# Patient Record
Sex: Female | Born: 1965
Health system: Southern US, Community
[De-identification: ages and names within clinical notes are randomized; demographics above are authoritative.]

## PROBLEM LIST (undated history)

## (undated) DIAGNOSIS — M949 Disorder of cartilage, unspecified: Secondary | ICD-10-CM

## (undated) DIAGNOSIS — F419 Anxiety disorder, unspecified: Secondary | ICD-10-CM

## (undated) DIAGNOSIS — D649 Anemia, unspecified: Secondary | ICD-10-CM

## (undated) DIAGNOSIS — Z87442 Personal history of urinary calculi: Secondary | ICD-10-CM

## (undated) DIAGNOSIS — E538 Deficiency of other specified B group vitamins: Secondary | ICD-10-CM

## (undated) DIAGNOSIS — F411 Generalized anxiety disorder: Secondary | ICD-10-CM

## (undated) DIAGNOSIS — K589 Irritable bowel syndrome without diarrhea: Secondary | ICD-10-CM

## (undated) DIAGNOSIS — F329 Major depressive disorder, single episode, unspecified: Secondary | ICD-10-CM

## (undated) DIAGNOSIS — D518 Other vitamin B12 deficiency anemias: Secondary | ICD-10-CM

## (undated) DIAGNOSIS — E669 Obesity, unspecified: Secondary | ICD-10-CM

## (undated) DIAGNOSIS — M899 Disorder of bone, unspecified: Secondary | ICD-10-CM

## (undated) DIAGNOSIS — N920 Excessive and frequent menstruation with regular cycle: Secondary | ICD-10-CM

## (undated) DIAGNOSIS — E785 Hyperlipidemia, unspecified: Secondary | ICD-10-CM

## (undated) DIAGNOSIS — K648 Other hemorrhoids: Secondary | ICD-10-CM

## (undated) DIAGNOSIS — F32A Depression, unspecified: Secondary | ICD-10-CM

## (undated) DIAGNOSIS — K625 Hemorrhage of anus and rectum: Secondary | ICD-10-CM

## (undated) DIAGNOSIS — R45851 Suicidal ideations: Secondary | ICD-10-CM

## (undated) DIAGNOSIS — F4310 Post-traumatic stress disorder, unspecified: Secondary | ICD-10-CM

## (undated) DIAGNOSIS — F332 Major depressive disorder, recurrent severe without psychotic features: Secondary | ICD-10-CM

## (undated) HISTORY — DX: Other hemorrhoids: K64.8

## (undated) HISTORY — DX: Major depressive disorder, recurrent severe without psychotic features: F33.2

## (undated) HISTORY — DX: Generalized anxiety disorder: F41.1

## (undated) HISTORY — DX: Depression, unspecified: F32.A

## (undated) HISTORY — DX: Personal history of urinary calculi: Z87.442

## (undated) HISTORY — DX: Disorder of bone, unspecified: M89.9

## (undated) HISTORY — DX: Major depressive disorder, single episode, unspecified: F32.9

## (undated) HISTORY — DX: Obesity, unspecified: E66.9

## (undated) HISTORY — PX: LITHOTRIPSY: SUR834

## (undated) HISTORY — DX: Irritable bowel syndrome without diarrhea: K58.9

## (undated) HISTORY — PX: COLONOSCOPY: SHX174

## (undated) HISTORY — DX: Other vitamin B12 deficiency anemias: D51.8

## (undated) HISTORY — DX: Hyperlipidemia, unspecified: E78.5

## (undated) HISTORY — DX: Hemorrhage of anus and rectum: K62.5

## (undated) HISTORY — DX: Excessive and frequent menstruation with regular cycle: N92.0

## (undated) HISTORY — DX: Anemia, unspecified: D64.9

## (undated) HISTORY — DX: Post-traumatic stress disorder, unspecified: F43.10

## (undated) HISTORY — DX: Disorder of cartilage, unspecified: M94.9

## (undated) HISTORY — DX: Suicidal ideations: R45.851

---

## 1998-11-22 ENCOUNTER — Other Ambulatory Visit: Admission: RE | Admit: 1998-11-22 | Discharge: 1998-11-22 | Payer: Self-pay | Admitting: Obstetrics and Gynecology

## 1999-12-03 ENCOUNTER — Emergency Department (HOSPITAL_COMMUNITY): Admission: EM | Admit: 1999-12-03 | Discharge: 1999-12-03 | Payer: Self-pay | Admitting: Emergency Medicine

## 1999-12-03 ENCOUNTER — Encounter: Payer: Self-pay | Admitting: Emergency Medicine

## 2000-01-02 ENCOUNTER — Other Ambulatory Visit: Admission: RE | Admit: 2000-01-02 | Discharge: 2000-01-02 | Payer: Self-pay | Admitting: Obstetrics and Gynecology

## 2000-10-04 ENCOUNTER — Encounter: Payer: Self-pay | Admitting: Internal Medicine

## 2000-10-04 ENCOUNTER — Encounter: Admission: RE | Admit: 2000-10-04 | Discharge: 2000-10-04 | Payer: Self-pay | Admitting: Internal Medicine

## 2001-03-21 ENCOUNTER — Other Ambulatory Visit: Admission: RE | Admit: 2001-03-21 | Discharge: 2001-03-21 | Payer: Self-pay | Admitting: Obstetrics and Gynecology

## 2001-10-17 ENCOUNTER — Encounter
Admission: RE | Admit: 2001-10-17 | Discharge: 2002-01-15 | Payer: Self-pay | Admitting: Physical Medicine & Rehabilitation

## 2002-03-25 ENCOUNTER — Encounter: Payer: Self-pay | Admitting: Emergency Medicine

## 2002-03-25 ENCOUNTER — Emergency Department (HOSPITAL_COMMUNITY): Admission: EM | Admit: 2002-03-25 | Discharge: 2002-03-25 | Payer: Self-pay | Admitting: Emergency Medicine

## 2002-05-27 ENCOUNTER — Other Ambulatory Visit: Admission: RE | Admit: 2002-05-27 | Discharge: 2002-05-27 | Payer: Self-pay | Admitting: Obstetrics and Gynecology

## 2002-09-17 ENCOUNTER — Inpatient Hospital Stay (HOSPITAL_COMMUNITY): Admission: EM | Admit: 2002-09-17 | Discharge: 2002-09-22 | Payer: Self-pay | Admitting: Psychiatry

## 2002-10-07 ENCOUNTER — Other Ambulatory Visit (HOSPITAL_COMMUNITY): Admission: RE | Admit: 2002-10-07 | Discharge: 2002-10-26 | Payer: Self-pay | Admitting: Psychiatry

## 2003-06-14 ENCOUNTER — Other Ambulatory Visit: Admission: RE | Admit: 2003-06-14 | Discharge: 2003-06-14 | Payer: Self-pay | Admitting: Obstetrics and Gynecology

## 2004-12-26 ENCOUNTER — Ambulatory Visit: Payer: Self-pay | Admitting: Family Medicine

## 2005-01-01 ENCOUNTER — Ambulatory Visit: Payer: Self-pay | Admitting: Family Medicine

## 2005-02-05 ENCOUNTER — Other Ambulatory Visit: Admission: RE | Admit: 2005-02-05 | Discharge: 2005-02-05 | Payer: Self-pay | Admitting: Obstetrics and Gynecology

## 2005-04-10 ENCOUNTER — Ambulatory Visit: Payer: Self-pay | Admitting: Internal Medicine

## 2005-05-14 ENCOUNTER — Ambulatory Visit: Payer: Self-pay | Admitting: Internal Medicine

## 2005-10-05 ENCOUNTER — Ambulatory Visit: Payer: Self-pay | Admitting: Internal Medicine

## 2006-01-12 ENCOUNTER — Emergency Department (HOSPITAL_COMMUNITY): Admission: EM | Admit: 2006-01-12 | Discharge: 2006-01-12 | Payer: Self-pay | Admitting: Emergency Medicine

## 2006-01-17 ENCOUNTER — Ambulatory Visit: Payer: Self-pay | Admitting: Internal Medicine

## 2006-01-18 ENCOUNTER — Ambulatory Visit (HOSPITAL_COMMUNITY): Admission: RE | Admit: 2006-01-18 | Discharge: 2006-01-18 | Payer: Self-pay | Admitting: Urology

## 2006-01-24 ENCOUNTER — Ambulatory Visit (HOSPITAL_COMMUNITY): Admission: RE | Admit: 2006-01-24 | Discharge: 2006-01-24 | Payer: Self-pay | Admitting: Urology

## 2006-05-06 ENCOUNTER — Ambulatory Visit: Payer: Self-pay | Admitting: Internal Medicine

## 2006-06-03 ENCOUNTER — Ambulatory Visit (HOSPITAL_BASED_OUTPATIENT_CLINIC_OR_DEPARTMENT_OTHER): Admission: RE | Admit: 2006-06-03 | Discharge: 2006-06-03 | Payer: Self-pay | Admitting: Urology

## 2006-08-08 ENCOUNTER — Ambulatory Visit: Payer: Self-pay | Admitting: Internal Medicine

## 2006-08-08 LAB — CONVERTED CEMR LAB
Folate: 5.4 ng/mL
TSH: 1.49 microintl units/mL (ref 0.35–5.50)
Vitamin B-12: 248 pg/mL (ref 211–911)

## 2006-08-09 ENCOUNTER — Encounter: Admission: RE | Admit: 2006-08-09 | Discharge: 2006-08-09 | Payer: Self-pay | Admitting: Internal Medicine

## 2006-08-14 ENCOUNTER — Ambulatory Visit: Payer: Self-pay | Admitting: Internal Medicine

## 2006-09-09 ENCOUNTER — Ambulatory Visit: Payer: Self-pay | Admitting: Internal Medicine

## 2006-10-04 ENCOUNTER — Ambulatory Visit: Payer: Self-pay | Admitting: Internal Medicine

## 2006-10-28 ENCOUNTER — Ambulatory Visit (HOSPITAL_COMMUNITY): Admission: RE | Admit: 2006-10-28 | Discharge: 2006-10-28 | Payer: Self-pay | Admitting: Urology

## 2006-10-30 ENCOUNTER — Ambulatory Visit: Payer: Self-pay | Admitting: Internal Medicine

## 2006-11-20 ENCOUNTER — Ambulatory Visit: Payer: Self-pay | Admitting: Internal Medicine

## 2006-12-06 ENCOUNTER — Ambulatory Visit: Payer: Self-pay | Admitting: Internal Medicine

## 2006-12-06 LAB — CONVERTED CEMR LAB
Folate: >20 ng/mL
Vitamin B-12: 1029 pg/mL — ABNORMAL HIGH (ref 211–911)

## 2007-01-02 ENCOUNTER — Ambulatory Visit: Payer: Self-pay | Admitting: Internal Medicine

## 2007-01-02 LAB — CONVERTED CEMR LAB
AST: 20 units/L (ref 0–37)
Albumin: 3.6 g/dL (ref 3.5–5.2)
Alkaline Phosphatase: 71 units/L (ref 39–117)
Cholesterol: 293 mg/dL (ref 0–200)
Total CHOL/HDL Ratio: 6.9
VLDL: 50 mg/dL — ABNORMAL HIGH (ref 0–40)

## 2007-01-29 ENCOUNTER — Inpatient Hospital Stay (HOSPITAL_COMMUNITY): Admission: RE | Admit: 2007-01-29 | Discharge: 2007-01-31 | Payer: Self-pay | Admitting: Psychiatry

## 2007-01-29 ENCOUNTER — Emergency Department (HOSPITAL_COMMUNITY): Admission: EM | Admit: 2007-01-29 | Discharge: 2007-01-29 | Payer: Self-pay | Admitting: Emergency Medicine

## 2007-01-29 ENCOUNTER — Ambulatory Visit: Payer: Self-pay | Admitting: Psychiatry

## 2007-02-02 ENCOUNTER — Emergency Department (HOSPITAL_COMMUNITY): Admission: EM | Admit: 2007-02-02 | Discharge: 2007-02-02 | Payer: Self-pay | Admitting: Emergency Medicine

## 2007-02-06 ENCOUNTER — Encounter: Admission: RE | Admit: 2007-02-06 | Discharge: 2007-02-06 | Payer: Self-pay | Admitting: Neurology

## 2007-02-18 ENCOUNTER — Encounter: Payer: Self-pay | Admitting: Internal Medicine

## 2007-03-03 ENCOUNTER — Ambulatory Visit: Payer: Self-pay | Admitting: Internal Medicine

## 2007-04-08 DIAGNOSIS — M899 Disorder of bone, unspecified: Secondary | ICD-10-CM | POA: Insufficient documentation

## 2007-04-08 DIAGNOSIS — M949 Disorder of cartilage, unspecified: Secondary | ICD-10-CM

## 2007-04-08 DIAGNOSIS — F329 Major depressive disorder, single episode, unspecified: Secondary | ICD-10-CM

## 2007-04-08 DIAGNOSIS — Z87442 Personal history of urinary calculi: Secondary | ICD-10-CM

## 2007-04-08 DIAGNOSIS — F3289 Other specified depressive episodes: Secondary | ICD-10-CM | POA: Insufficient documentation

## 2007-04-08 HISTORY — DX: Personal history of urinary calculi: Z87.442

## 2007-04-08 HISTORY — DX: Disorder of bone, unspecified: M89.9

## 2007-04-08 HISTORY — DX: Other specified depressive episodes: F32.89

## 2007-04-08 HISTORY — DX: Major depressive disorder, single episode, unspecified: F32.9

## 2007-04-22 ENCOUNTER — Encounter: Payer: Self-pay | Admitting: Internal Medicine

## 2007-05-01 ENCOUNTER — Ambulatory Visit: Payer: Self-pay | Admitting: Internal Medicine

## 2007-05-01 DIAGNOSIS — D518 Other vitamin B12 deficiency anemias: Secondary | ICD-10-CM

## 2007-05-01 HISTORY — DX: Other vitamin B12 deficiency anemias: D51.8

## 2007-05-08 ENCOUNTER — Ambulatory Visit: Payer: Self-pay | Admitting: Internal Medicine

## 2007-06-05 ENCOUNTER — Ambulatory Visit: Payer: Self-pay | Admitting: Internal Medicine

## 2007-06-09 ENCOUNTER — Ambulatory Visit: Payer: Self-pay | Admitting: Internal Medicine

## 2007-06-09 ENCOUNTER — Encounter: Payer: Self-pay | Admitting: Internal Medicine

## 2007-07-04 ENCOUNTER — Ambulatory Visit: Payer: Self-pay | Admitting: Internal Medicine

## 2007-07-04 LAB — CONVERTED CEMR LAB
Basophils Absolute: 0.1 10*3/uL (ref 0.0–0.1)
Eosinophils Absolute: 0.1 10*3/uL (ref 0.0–0.6)
Eosinophils Relative: 1.6 % (ref 0.0–5.0)
MCV: 90.8 fL (ref 78.0–100.0)
Monocytes Absolute: 0.6 10*3/uL (ref 0.2–0.7)
Vit D, 1,25-Dihydroxy: 26 — ABNORMAL LOW (ref 30–89)
Vitamin B-12: >1500 pg/mL — ABNORMAL HIGH (ref 211–911)
WBC: 9.1 10*3/uL (ref 4.5–10.5)

## 2007-07-08 ENCOUNTER — Telehealth: Payer: Self-pay | Admitting: *Deleted

## 2007-07-10 ENCOUNTER — Ambulatory Visit: Payer: Self-pay | Admitting: Internal Medicine

## 2007-07-15 ENCOUNTER — Ambulatory Visit: Payer: Self-pay | Admitting: Internal Medicine

## 2007-08-07 ENCOUNTER — Ambulatory Visit: Payer: Self-pay | Admitting: Internal Medicine

## 2007-08-07 LAB — CONVERTED CEMR LAB: Vit D, 1,25-Dihydroxy: 26 — ABNORMAL LOW (ref 30–89)

## 2007-09-03 ENCOUNTER — Ambulatory Visit: Payer: Self-pay | Admitting: Internal Medicine

## 2007-09-30 ENCOUNTER — Ambulatory Visit: Payer: Self-pay | Admitting: Internal Medicine

## 2007-10-06 DIAGNOSIS — K644 Residual hemorrhoidal skin tags: Secondary | ICD-10-CM | POA: Insufficient documentation

## 2007-10-06 DIAGNOSIS — K589 Irritable bowel syndrome without diarrhea: Secondary | ICD-10-CM | POA: Insufficient documentation

## 2007-10-06 DIAGNOSIS — E785 Hyperlipidemia, unspecified: Secondary | ICD-10-CM

## 2007-10-06 DIAGNOSIS — K625 Hemorrhage of anus and rectum: Secondary | ICD-10-CM

## 2007-10-06 DIAGNOSIS — K648 Other hemorrhoids: Secondary | ICD-10-CM

## 2007-10-06 DIAGNOSIS — N920 Excessive and frequent menstruation with regular cycle: Secondary | ICD-10-CM

## 2007-10-06 DIAGNOSIS — E669 Obesity, unspecified: Secondary | ICD-10-CM

## 2007-10-06 HISTORY — DX: Hemorrhage of anus and rectum: K62.5

## 2007-10-06 HISTORY — DX: Obesity, unspecified: E66.9

## 2007-10-06 HISTORY — DX: Excessive and frequent menstruation with regular cycle: N92.0

## 2007-10-06 HISTORY — DX: Irritable bowel syndrome, unspecified: K58.9

## 2007-10-06 HISTORY — DX: Other hemorrhoids: K64.8

## 2007-10-06 HISTORY — DX: Hyperlipidemia, unspecified: E78.5

## 2007-10-21 ENCOUNTER — Ambulatory Visit: Payer: Self-pay | Admitting: Internal Medicine

## 2007-11-18 ENCOUNTER — Ambulatory Visit: Payer: Self-pay | Admitting: Internal Medicine

## 2007-12-23 ENCOUNTER — Ambulatory Visit: Payer: Self-pay | Admitting: Internal Medicine

## 2008-01-22 ENCOUNTER — Ambulatory Visit: Payer: Self-pay | Admitting: Internal Medicine

## 2008-03-02 ENCOUNTER — Telehealth: Payer: Self-pay | Admitting: Internal Medicine

## 2008-07-15 ENCOUNTER — Telehealth: Payer: Self-pay | Admitting: Internal Medicine

## 2008-10-12 IMAGING — CR DG ABDOMEN 1V
1 series · 1 of 1 positions shown · non-contrast
Comparison: none

CLINICAL DATA: Preoperative exam.  Right renal stone.
 ABDOMEN ? 1 VIEW:

[t abdomen supine]
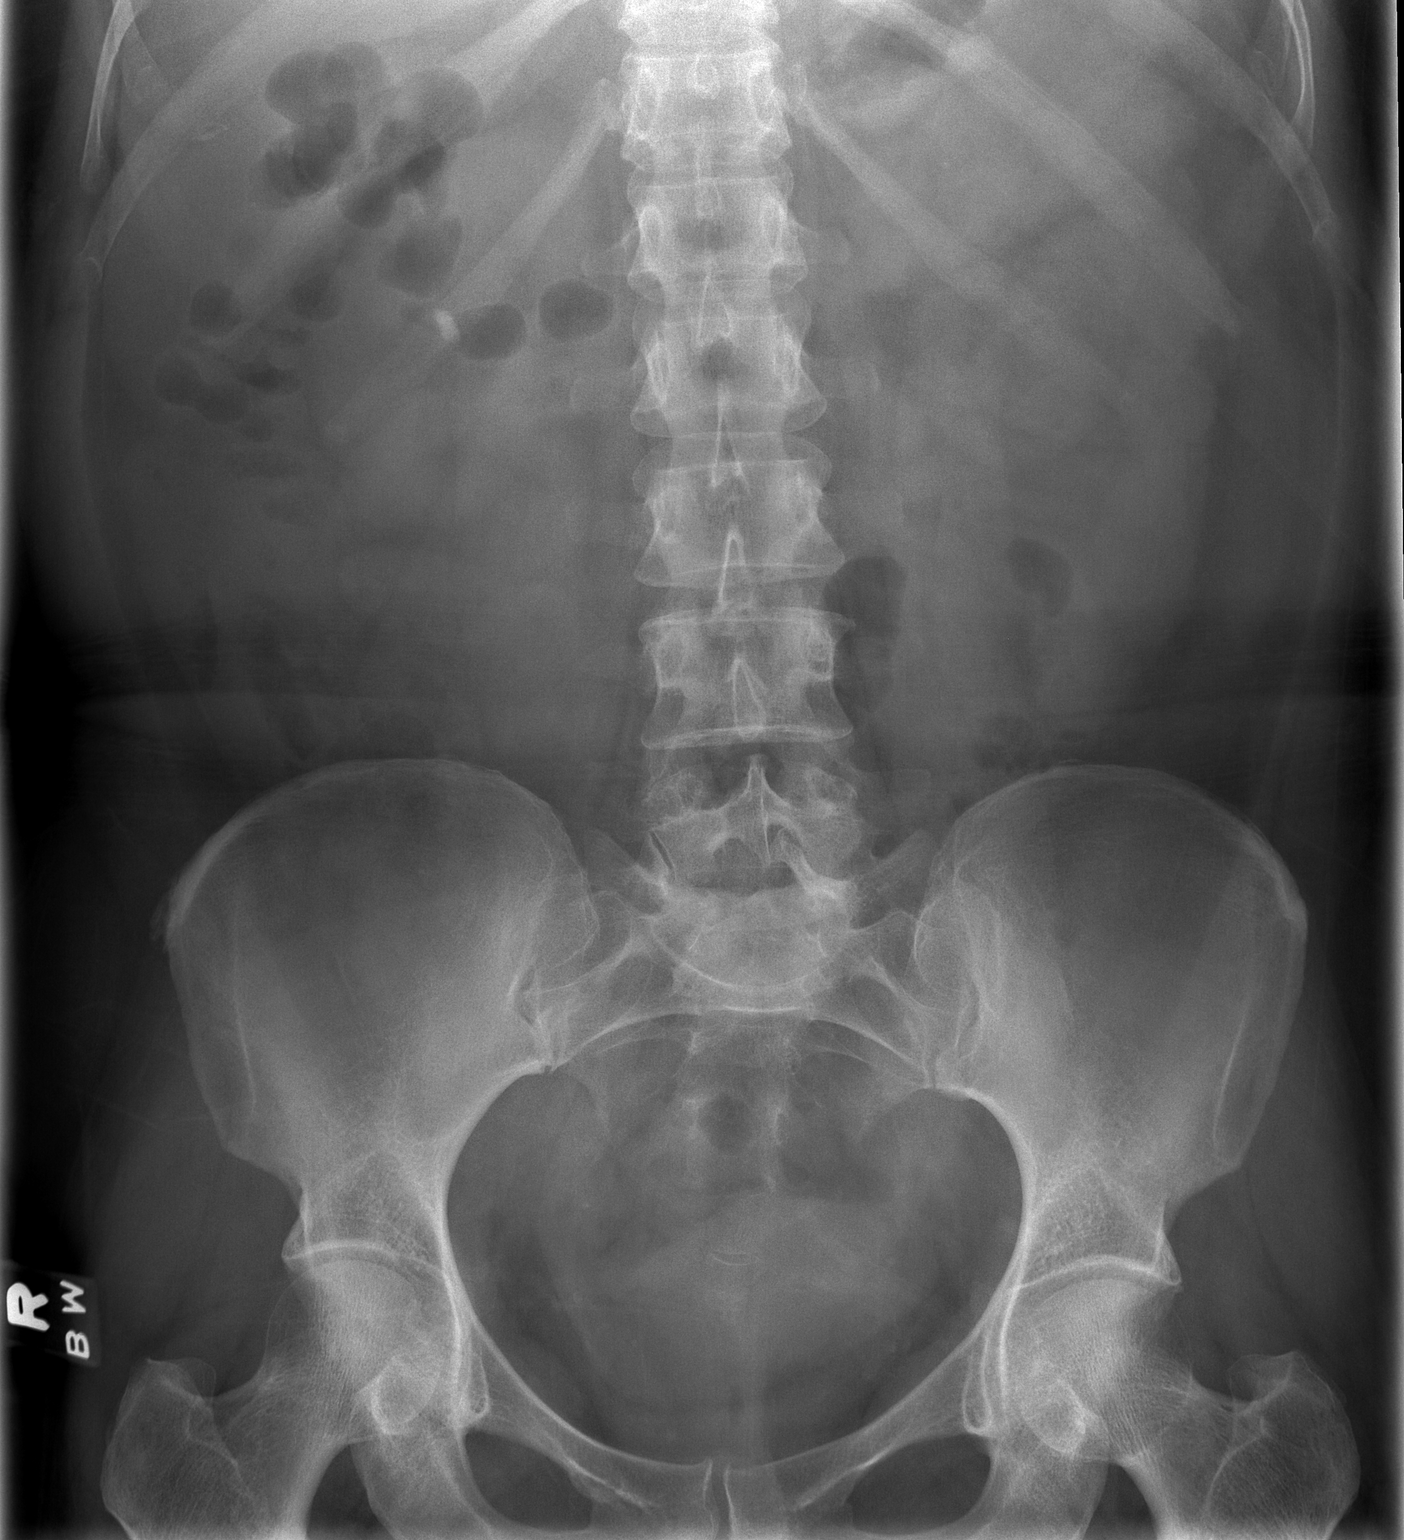

[1 of 1 positions shown; findings below may reference images not displayed]

FINDINGS: There is a 10 x 5 mm stone in the mid portion of the right kidney.  There is a tiny calcification in the upper pole of the left kidney.  No discrete calculi are seen along the course of either ureter.  Bowel gas pattern is normal.  No significant bony abnormality.
IMPRESSION: Right renal stone as described.

## 2010-01-24 ENCOUNTER — Emergency Department (HOSPITAL_COMMUNITY): Admission: EM | Admit: 2010-01-24 | Discharge: 2010-01-24 | Payer: Self-pay | Admitting: Emergency Medicine

## 2010-02-23 ENCOUNTER — Encounter: Payer: Self-pay | Admitting: Internal Medicine

## 2010-09-26 NOTE — Letter (Signed)
Summary: Diagnostic Exam/[facility]  [facility]   Imported By: Maryln Gottron 03/16/2010 14:47:41  _____________________________________________________________________  External Attachment:    Type:   Image     Comment:   External Document

## 2010-11-13 LAB — COMPREHENSIVE METABOLIC PANEL
Albumin: 4.1 g/dL (ref 3.5–5.2)
Chloride: 107 mEq/L (ref 96–112)
GFR calc non Af Amer: >60 mL/min (ref >60)
Glucose, Bld: 101 mg/dL — ABNORMAL HIGH (ref 70–99)
Potassium: 3.9 mEq/L (ref 3.5–5.1)
Total Protein: 7.9 g/dL (ref 6.0–8.3)

## 2010-11-13 LAB — RAPID URINE DRUG SCREEN, HOSP PERFORMED
Amphetamines: NOT DETECTED
Barbiturates: NOT DETECTED
Benzodiazepines: NOT DETECTED
Opiates: NOT DETECTED
Tetrahydrocannabinol: NOT DETECTED

## 2010-11-13 LAB — DIFFERENTIAL
Basophils Relative: 1 % (ref 0–1)
Eosinophils Relative: 2 % (ref 0–5)
Neutrophils Relative %: 60 % (ref 43–77)

## 2010-11-13 LAB — CBC
MCHC: 33.8 g/dL (ref 30.0–36.0)
MCV: 90.7 fL (ref 78.0–100.0)
RBC: 4.16 MIL/uL (ref 3.87–5.11)
RDW: 13.8 % (ref 11.5–15.5)
WBC: 10.8 10*3/uL — ABNORMAL HIGH (ref 4.0–10.5)

## 2010-11-13 LAB — URINALYSIS, ROUTINE W REFLEX MICROSCOPIC
Bilirubin Urine: NEGATIVE
Protein, ur: NEGATIVE mg/dL
Specific Gravity, Urine: 1.007 (ref 1.005–1.030)
pH: 6 (ref 5.0–8.0)

## 2010-11-15 ENCOUNTER — Other Ambulatory Visit: Payer: Self-pay | Admitting: Obstetrics and Gynecology

## 2010-11-15 DIAGNOSIS — R928 Other abnormal and inconclusive findings on diagnostic imaging of breast: Secondary | ICD-10-CM

## 2010-12-01 ENCOUNTER — Ambulatory Visit
Admission: RE | Admit: 2010-12-01 | Discharge: 2010-12-01 | Disposition: A | Discharge status: Home / Self Care | Payer: BC Managed Care – PPO | Source: Ambulatory Visit | Attending: Obstetrics and Gynecology | Admitting: Obstetrics and Gynecology

## 2010-12-01 DIAGNOSIS — R928 Other abnormal and inconclusive findings on diagnostic imaging of breast: Secondary | ICD-10-CM

## 2011-01-09 NOTE — Assessment & Plan Note (Signed)
[facility]                         GASTROENTEROLOGY OFFICE NOTE   NAME:Toni Alvarado, Toni Alvarado                       MRN:          [id number]  DATE:07/15/2007                            DOB:          May 28, 1966    CHIEF COMPLAINT:  Followup after colonoscopy.   HISTORY OF PRESENT ILLNESS:  She is having occasional bright red blood  on the tissue paper from her hemorrhoids (found at colonoscopy) and has  been having some diarrhea.  Overall, her bowel habits are pretty good,  without too much constipation or diarrhea.  Occasional heartburn,  usually after spicy foods.  Wondering why she is calcium, vitamin D and  vitamin B12 deficient.   PAST MEDICAL HISTORY:  Is listed and reviewed and is unchanged from  previous.  Her colonoscopy had random biopsies that were unrevealing,  i.e.  no colitis.   MEDICATIONS:  Listed and reviewed in the chart.   SOCIAL HISTORY:  She is not smoking.   ALLERGIES:  Reviewed and updated.   PHYSICAL EXAMINATION:  VITAL SIGNS:  Weight 187 pounds.  Pulse 78.  Blood pressure 100/64, height 5 feet 3-3/4 inches.   ASSESSMENT:  1. Irritable bowel syndrome, overall improved.  2. Rectal bleeding from hemorrhoids.  3. She is obese.   PLAN:  1. Reassured.  2. IBS and GERD handouts given, particularly paying attention to GERD      diet.  She can use an over the counter antacid or H2 blocker if      needed intermittently.  3. Align probiotic, samples are given to the patient.  4. Given her body habitus I think it would be pretty unlikely she      would have Celiac disease.  After she left, and as I dictate,      Celiac disease could explain vitamin deficiencies and osteopenia,      though there are also many people that have it without that.  I      think her low iron was from previous menorrhagia.  Should she      continue to have problems, we could screen with Celiac antibodies      but I would hold off on that right now.  She  will see me as needed      otherwise.   Note:  We plan for a colonoscopy again at about age 46 and after that,  due to some family history issues, probably would go every 5 years,  unless we know something different in the interim.     Iva Boop, MD,FACG  Electronically Signed    CEG/MedQ  DD: 07/15/2007  DT: 07/16/2007  Job #: [id number]   cc:   Stacie Glaze, MD  Guy Sandifer Henderson Cloud, M.D.

## 2011-01-09 NOTE — Assessment & Plan Note (Signed)
[facility]                         GASTROENTEROLOGY OFFICE NOTE   NAME:Toni Alvarado, Toni Alvarado                       MRN:          [id number]  DATE:05/08/2007                            DOB:          08-05-1966    CHIEF COMPLAINT:  Rectal bleeding and diarrhea.   I had seen Toni Alvarado last year for urgent defecation, diarrhea problems,  some incontinence.  She has had rectal bleeding.  These symptoms have  persisted.  She was scheduled for colonoscopy at that time, but because  of kidney Schueler issues deferred.  She has seen Dr. Henderson Cloud recently and  was having these problems again more persistently and he recommended she  come back to see me.   The symptoms are really not significantly changed from my note of  May 06, 2006.   MEDICATIONS:  Listed in the chart.  She is on B12 monthly, Prozac, Omega-  3, potassium citrate, multivitamin.   Laboratory testing last year demonstrated normal LFTs, TSH, CBC.   PAST MEDICAL HISTORY:  1. Dyslipidemia.  2. Intermittent depression.  3. Nephrolithiasis requiring lithotripsy, cystoscopy and stenting.  4. Osteopenia.  5. Pelvic relaxation.  6. Menorrhagia problems.   REVIEW OF SYSTEMS:  There is some confusion as to whether she is  actually having rectal bleeding or menstrual bleeding at times, although  clearly in the past she has had rectal bleeding.   FAMILY HISTORY:  Mother may have irritable bowel syndrome.  Her mother  has had colon polyps and her mother's sister was recently diagnosed with  colon cancer in her early 69s.   PHYSICAL EXAMINATION:  GENERAL:  In no acute distress.  VITAL SIGNS:  Weight 189 pounds, pulse 80, blood pressure 118/68.  ABDOMEN:  Obese, soft, nontender.   ASSESSMENT:  1. Recurrent rectal bleeding and diarrhea, persistent problems.  2. Family history of colon cancer in one second-degree relative and      family history of colon polyps in one first-degree relative.  3.  Obesity/overweight.   PLAN:  1. Schedule colonoscopy.  Risks and benefits had been previously      reviewed.  2. Further therapy plans pending that.  I would not be surprised if      this lady has irritable bowel syndrome.  3. Regarding her obesity, we have discussed diet.  She is walking and      drinking water and reducing types of foods, but I explained to her      that she would probably take a 1200-1500 calorie diet on a steady      basis to really drop weight.  Her goal weight would be 144 for top      normal, though I think 150-160 would be good for her.  I also      provided information about the dangers of high fructose corn syrup      and suggested she look into the [facility] Diet or Sugar Busters      Diet.     Iva Boop, MD,FACG  Electronically Signed    CEG/MedQ  DD: 05/08/2007  DT: 05/09/2007  Job #: [id number]   cc:   Guy Sandifer. Henderson Cloud, M.D.  Stacie Glaze, MD
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
## 2011-01-12 NOTE — Op Note (Signed)
NAMEBERNETTE, SEEMAN                ACCOUNT NO.:  [id number]   MEDICAL RECORD NO.:  [id number]          PATIENT TYPE:  AMB   LOCATION:  DAY                          FACILITY:  [facility]   PHYSICIAN:  Valetta Fuller, M.D.  DATE OF BIRTH:  Feb 23, 1966   DATE OF PROCEDURE:  01/18/2006  DATE OF DISCHARGE:                                 OPERATIVE REPORT   PREOPERATIVE DIAGNOSIS:  1.  Right ureteropelvic junction Quattrone.  2.  Hydronephrosis.   POSTOPERATIVE DIAGNOSIS:  1.  Right ureteropelvic junction Haddon.  2.  Hydronephrosis.   PROCEDURE PERFORMED:  Cystoscopy, right retrograde pyelography, right double-  J stent placement.   SURGEON:  Valetta Fuller, M.D.   ANESTHESIA:  General.   INDICATIONS:  Ms. Toni Alvarado is a 45 year old female.  She presented with right  flank pain recently.  She had 2-3 episodes of nephrolithiasis in the past.  She had a CT scan done which showed a 7 x 10 mm Gavin in her right renal  pelvis at the ureteropelvic junction area/proximal ureter.  She also had  several additional stones in the kidney, several of which were medium in  size.  We felt that given the holiday weekend coming up with hydronephrosis  and ongoing pain as well as some question of mild infection, it was prudent  to go ahead and decompress the kidney.  We have gotten her on the schedule  for elective lithotripsy.  The patient has been on antibiotics, has been  afebrile, and is just having mild discomfort with some generalized lethargy  at this point.  She presents now for stent placement.   TECHNIQUE AND FINDINGS:  The patient was brought to the operating room where  she had successful induction of general anesthesia.  She was placed in the  lithotomy position, prepped and draped in the usual manner.  She received IV  ciprofloxacin.  Cystoscopy revealed an unremarkable bladder.  Attention was  turned towards retrograde pyelography.  This was done with an 8-French cone-  tip catheter.  The  distal ureter was normal.  An 8-10 mm filling defect was  noted in the very proximal ureter causing high grade obstruction with a  moderately dilated caliceal pelvic system.  This was done with fluoroscopic  guidance and interpreted by myself.   Attention was then turned towards stent placement.  Initial attempts to put  the sensor wire past the Headrick were unsuccessful.  By utilizing an open-  ended catheter to provide some additional stiffness, we were able to get the  wire  passed without difficulty.  Once that was accomplished, a 5-French 24 cm  Polaris stent was placed without the dangle string.  The position was  confirmed with fluoroscopy as well as direct vision.  The patient appeared  to tolerate the procedure well and there were no obvious complications or  difficulty.           ______________________________  Valetta Fuller, M.D.  Electronically Signed     DSG/MEDQ  D:  01/18/2006  T:  01/19/2006  Job:  [id number]

## 2011-01-12 NOTE — Op Note (Signed)
NAMEMICHEALE, SCHLACK                ACCOUNT NO.:  [id number]   MEDICAL RECORD NO.:  [id number]          PATIENT TYPE:  AMB   LOCATION:  NESC                         FACILITY:  [facility]   PHYSICIAN:  Valetta Fuller, M.D.  DATE OF BIRTH:  10-Jan-1966   DATE OF PROCEDURE:  06/03/2006  DATE OF DISCHARGE:                                 OPERATIVE REPORT   PREOPERATIVE DIAGNOSIS:  1. Multiple right distal ureteral calculi.  2. Right renal calculus.   POSTOPERATIVE DIAGNOSIS:  1. Multiple right distal ureteral calculi.  2. Right renal calculus.   PROCEDURE PERFORMED:  Cystoscopy, right retrograde pyelography, rigid  ureteroscopy, Holmium laser lithotripsy, basketing of fragments, flexible  ureteroscopy, double-J stent placement.   SURGEON:  Valetta Fuller, M.D.   ANESTHESIA:  General.   INDICATIONS:  Ms. Meenan is a 45 year old female who has significant history  of nephrolithiasis.  She was originally seen and treated in May of this year  at which point she had developed right flank pain and was found to have  about a 10 mm Welchel in her renal pelvis and another 10 mm Goldwater in her lower  to mid pole of that kidney.  She ended up having a double-J stent and then  had a lithotripsy.  She passed a few fragments and then became asymptomatic.  It looked like things had broke up nicely and the stent was removed.  The  patient subsequently had some intermittent nausea and mild discomfort.  We  followed up with an additional Orcutt protocol CT and we found that she had  multiple stones in her ureter.  We gave her some additional weeks to try to  pass these spontaneously but that has not occurred.  She is felt to have, to  some degree, a lodging of Cappiello fragments in her distal ureter.  She also  requested that we make an attempt to try to treat this mid pole Schuessler if at  all possible.  She is here now for assessment and treatment of the above  mentioned issues.   TECHNIQUE AND FINDINGS:  The  patient was brought to the operating room where  she had successful induction of general anesthesia.  She was placed in the  lithotomy position.  The initial evaluation consisted of right retrograde  pyelography.  Cystoscopic assessment of the bladder was unremarkable.  Using  an 8-French cone tip catheter and real time fluoroscopic assessment, we did  a retrograde of the right ureter which showed multiple distal right ureteral  filling defects with mild obstruction.   At the completion of retrograde, a guidewire was placed up the renal pelvis  without difficulty.  The distal right ureter was easily engaged with a short  rigid ureteroscope.  Multiple Younes fragments were encountered.  The largest  piece appeared to be approximately 4 x 8 mm.  Holmium laser lithotripsy was  used to break all these fragments into smaller pieces.  Several 2-3 mm  fragments were then basket extracted and we ended up removing about a half a  dozen pieces from her ureter.  No additional  distal ureteral fragments were  noted.  Since that portion of the procedure went well, we felt it was  reasonable to go and assess the kidney Grau in her right kidney to see if  that would be amendable to lithotripsy.  Over the guidewire, an access  sheath was placed and a flexible ureteroscope was inserted.  We were able to  visualize a Santoro in a lower calix.  It did take quite an acute angle.  We  attempted, for approximately 30-40 minutes, to try to be able to access that  Pore but because of the angulation, I was really never able to really  carefully visualize the Guilbault at the same time as placing the laser fiber on  the Reta.  The combination of the acute angle and respiratory motion made  it very difficult to effectively treat the Barrack.  We were able to treat a  few small fragments and I felt, at that point, that breaking the Levett into  2-3 larger pieces would probably be actually a disservice rather than a  benefit  to the patient, and we elected to consider that Reitman for possible  lithotripsy down the road or continued observation.  Otherwise, no  complications or problems occurred.  Over the guidewire, we placed a 24 cm 6-  French double-J stent which was confirmed to be a good position.  The  patient appeared to tolerate things well, there were no obvious  complications or problems.           ______________________________  Valetta Fuller, M.D.  Electronically Signed     DSG/MEDQ  D:  06/03/2006  T:  06/04/2006  Job:  [id number]
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
## 2011-01-12 NOTE — Assessment & Plan Note (Signed)
HEALTHCARE                           GASTROENTEROLOGY OFFICE NOTE   NAME:Toni Alvarado, Toni Alvarado                       MRN:          [id number]  DATE:05/06/2006                            DOB:          November 24, 1965    REASON FOR CONSULTATION:  Diarrhea, rectal bleeding.   ASSESSMENT:  A 45 year old white woman with some chronic urgent defecation  with fecal incontinence, diarrhea-type problems.  Mainly a daytime problem  but can be nocturnal.  It is associated with some rectal bleeding.  The main  differential diagnosis is inflammatory bowel disease versus irritable bowel  syndrome, though colorectal neoplasia is possible.   She also has some acid reflux symptoms that are greatly improved by dietary  changes she has made.   RECOMMENDATIONS AND PLAN:  1. Schedule colonoscopy to investigate the rectal bleeding and chronic      diarrhea.  Strongly consider random biopsies of the colon and terminal      ileum intubation.  2. Further plans pending that.  I have explained the risks, benefits and      indications of the procedure to the patient and she understands and      agrees to proceed.  3. We have requested labs from Dr. Huel Coventry office and on March 04, 2006,      she had a normal CBC, LFTs and TSH, free T4 and free T3.   HISTORY:  This lady has had several years of problems with crampy lower  quadrant pain associated with urgent defecation that can lead to fecal  incontinence.  Mainly a postprandial thing.  Spicy and heavy, greasy foods  are the main problems.  She has not noted an association with milk and, in  fact, does not drink much.  There is moderate caffeine ingestion with 2 cups  of coffee and some tea each day.  After multiple bowel movements she will  note a swelling and will see some bright red blood on the tissue paper.  She  has had some nocturnal symptoms.  There is nausea, gas and bloating  associated with this.   MEDICATIONS:  1.  Prozac 40 mg daily.  2. Iron daily.  3. Vitamin C daily.  4. Omega-3 fatty acid daily.   No known drug allergies.   PAST MEDICAL HISTORY:  1. Dyslipidemia.  2. Depression intermittently.  3. Nephrolithiasis with recent lithotripsy after cystoscopy and stent      placement.  4. Osteopenia.   FAMILY HISTORY:  Mother may have irritable bowel syndrome.  There is  alcoholism in her father, mother and brother and grandfathers.  Diabetes is  in the family.  There is no colon cancer.   SOCIAL HISTORY:  She is going back to school and studying elementary  education at [employer].  She has one son and one daughter and lives  with her husband.  She does smoke 5 cigarettes a day and is trying to quit  and is encouraged.  There is rare alcohol.  There is no drug use.   REVIEW OF SYSTEMS:  See medical history and physical form for these  details.   PHYSICAL EXAMINATION:  GENERAL:  A pleasant overweight to obese white woman.  VITAL SIGNS:  Height 5 feet 3-3/4 inches, weight 190 pounds, blood pressure  118/66, pulse 62.  HEENT:  Eyes:  Anicteric.  ENT:  Normal mouth and posterior pharynx.  NECK:  Supple with no thyromegaly or mass.  CHEST:  Clear, resonant.  CARDIAC:  S1, S2, no rubs or gallops.  ABDOMEN:  Soft and diffusely tender without organomegaly or mass and no  guarding.  There is no hernia detected.  RECTAL:  Deferred until colonoscopy.  LYMPHATIC:  No neck or supraclavicular nodes.  EXTREMITIES:  No peripheral edema of the lower extremities.  SKIN:  No rash, warm and dry.  NEUROLOGIC/PSYCHIATRIC:  She is alert and oriented x3.   I appreciate the opportunity to care for this patient.  I have reviewed the  office notes from Dr. Henderson Cloud as well.                                   Iva Boop, MD,FACG   CEG/MedQ  DD:  05/06/2006  DT:  05/07/2006  Job #:  [id number]   cc:   Guy Sandifer. Henderson Cloud, M.D.  Valetta Fuller, M.D.  Stacie Glaze, MD

## 2011-06-14 LAB — DIFFERENTIAL
Basophils Absolute: 0
Basophils Relative: 0
Basophils Relative: 1
Eosinophils Absolute: 0.1
Eosinophils Relative: 1
Lymphocytes Relative: 33
Lymphs Abs: 3.2
Monocytes Absolute: 0.7
Monocytes Relative: 7
Neutro Abs: 5.8
Neutrophils Relative %: 58
Neutrophils Relative %: 61

## 2011-06-14 LAB — URINALYSIS, ROUTINE W REFLEX MICROSCOPIC
Bilirubin Urine: NEGATIVE
Glucose, UA: NEGATIVE
Hgb urine dipstick: NEGATIVE
Ketones, ur: NEGATIVE
Nitrite: NEGATIVE
Nitrite: NEGATIVE
Specific Gravity, Urine: 1.011
pH: 7

## 2011-06-14 LAB — CBC
MCHC: 33.8
MCHC: 34.2
Platelets: 319
RDW: 13.4
WBC: 9.7

## 2011-06-14 LAB — RAPID URINE DRUG SCREEN, HOSP PERFORMED
Barbiturates: NOT DETECTED
Benzodiazepines: NOT DETECTED
Cocaine: NOT DETECTED

## 2011-06-14 LAB — BASIC METABOLIC PANEL
BUN: 9
CO2: 26
CO2: 31
Calcium: 10
Chloride: 104
Creatinine, Ser: 0.71
GFR calc Af Amer: >60
Glucose, Bld: 89
Sodium: 135

## 2011-06-14 LAB — ETHANOL: Alcohol, Ethyl (B): <5

## 2011-06-14 LAB — POCT PREGNANCY, URINE: Operator id: 173591

## 2011-06-14 LAB — CARBAMAZEPINE LEVEL, TOTAL: Carbamazepine Lvl: 10.7

## 2012-03-25 ENCOUNTER — Other Ambulatory Visit: Payer: Self-pay | Admitting: Obstetrics and Gynecology

## 2012-03-25 DIAGNOSIS — R928 Other abnormal and inconclusive findings on diagnostic imaging of breast: Secondary | ICD-10-CM

## 2012-03-31 ENCOUNTER — Ambulatory Visit
Admission: RE | Admit: 2012-03-31 | Discharge: 2012-03-31 | Disposition: A | Discharge status: Home / Self Care | Payer: BC Managed Care – PPO | Source: Ambulatory Visit | Attending: Obstetrics and Gynecology | Admitting: Obstetrics and Gynecology

## 2012-03-31 DIAGNOSIS — R928 Other abnormal and inconclusive findings on diagnostic imaging of breast: Secondary | ICD-10-CM

## 2013-11-12 ENCOUNTER — Other Ambulatory Visit: Payer: Self-pay

## 2013-11-12 DIAGNOSIS — Z1231 Encounter for screening mammogram for malignant neoplasm of breast: Secondary | ICD-10-CM

## 2013-11-12 DIAGNOSIS — Z803 Family history of malignant neoplasm of breast: Secondary | ICD-10-CM

## 2013-11-24 ENCOUNTER — Other Ambulatory Visit: Payer: Self-pay | Admitting: Obstetrics and Gynecology

## 2013-11-25 ENCOUNTER — Ambulatory Visit
Admission: RE | Admit: 2013-11-25 | Discharge: 2013-11-25 | Disposition: A | Discharge status: Home / Self Care | Payer: BC Managed Care – PPO | Source: Ambulatory Visit

## 2013-11-25 DIAGNOSIS — Z803 Family history of malignant neoplasm of breast: Secondary | ICD-10-CM

## 2013-11-25 DIAGNOSIS — Z1231 Encounter for screening mammogram for malignant neoplasm of breast: Secondary | ICD-10-CM

## 2014-03-13 ENCOUNTER — Ambulatory Visit (INDEPENDENT_AMBULATORY_CARE_PROVIDER_SITE_OTHER): Payer: BC Managed Care – PPO | Admitting: Family Medicine

## 2014-03-13 VITALS — BP 106/70 | HR 70 | Temp 98.8°F | Resp 16 | Ht 63.5 in | Wt 184.4 lb

## 2014-03-13 DIAGNOSIS — Z0000 Encounter for general adult medical examination without abnormal findings: Secondary | ICD-10-CM

## 2014-03-13 DIAGNOSIS — Z23 Encounter for immunization: Secondary | ICD-10-CM

## 2014-03-13 NOTE — Progress Notes (Signed)
Subjective:  This chart was scribed for Toni Haber, MD by Donato Schultz, Medical Scribe. This patient was seen in Room 11 and the patient's care was started at 12:33 PM.   Patient ID: Toni Alvarado, female    DOB: [birth year], 48 y.o.   MRN: [id number]  HPI HPI Comments: Toni Alvarado is a 48 y.o. female with a history of anemia and depression who presents to the [facility] for an annual exam.  She denies having a history of heart disease, breathing problems, vision problems, and hearing loss.  She states that her last TDAP was in 2001.  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  The patient states that her PCP is Dr. Gertie Fey.  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     Past Medical History  Diagnosis Date  . Anemia   . Depression    Past Surgical History  Procedure Laterality Date  . Lithotripsy     Family History  Problem Relation Age of Onset  . Hyperlipidemia Mother   . Mental illness Mother   . Diabetes Father   . Heart disease Father   . Hyperlipidemia Father   . Stroke Father   . Mental illness Father   . Mental illness Brother   . Cancer Brother   . Breast cancer Sister    History   Social History  . Marital Status: Legally Separated    Spouse Name: N/A    Number of Children: N/A  . Years of Education: N/A   Occupational History  . Not on file.   Social History Main Topics  . Smoking status: Never Smoker   . Smokeless tobacco: Never Used  . Alcohol Use: No  . Drug Use: No  . Sexual Activity: Not on file   Other Topics Concern  . Not on file   Social History Narrative  . No narrative on file   No Known Allergies  Review of Systems  All other systems reviewed and are negative.    Objective:  Physical Exam  Nursing note and vitals reviewed. Constitutional: She is oriented to person, place, and time. She appears well-developed and well-nourished.  HENT:  Head:  Normocephalic and atraumatic.  Right Ear: External ear normal.  Left Ear: External ear normal.  Mouth/Throat: Oropharynx is clear and moist. No oropharyngeal exudate.  Eyes: Conjunctivae and EOM are normal. Pupils are equal, round, and reactive to light.  Neck: Normal range of motion. Neck supple. No JVD present. No thyromegaly present.  Cardiovascular: Normal rate, regular rhythm, normal heart sounds and intact distal pulses.   No murmur heard. Pulmonary/Chest: Effort normal and breath sounds normal. No stridor. No respiratory distress. She has no decreased breath sounds. She has no wheezes. She has no rhonchi. She has no rales.  Abdominal: Soft. She exhibits no distension and no mass. There is no tenderness. There is no rebound and no guarding.  Musculoskeletal: Normal range of motion. She exhibits no edema and no tenderness.  Lymphadenopathy:    She has no cervical adenopathy.  Neurological: She is alert and oriented to person, place, and time. She displays normal reflexes. No cranial nerve deficit. She exhibits normal muscle tone. Coordination normal.  Skin: Skin is warm and dry. No rash noted. No erythema.  Psychiatric: She has a normal mood and affect. Her behavior is normal.     BP 106/70  Pulse 70  Temp(Src) 98.8 F (  37.1 C) (Oral)  Resp 16  Ht 5' 3.5" (1.613 m)  Wt 184 lb 6 oz (83.632 kg)  BMI 32.14 kg/m2  SpO2 98%  LMP 02/25/2014  Assessment & Plan:    I personally performed the services described in this documentation, which was scribed in my presence. The recorded information has been reviewed and is accurate.  Need for prophylactic vaccination with combined diphtheria-tetanus-pertussis (DTP) vaccine - Plan: Tdap vaccine greater than or equal to 7yo IM  Routine general medical examination at a health care facility - Plan: Tdap vaccine greater than or equal to 7yo IM  Signed, Toni Haber, MD

## 2014-05-11 ENCOUNTER — Encounter (HOSPITAL_COMMUNITY): Payer: Self-pay | Admitting: Emergency Medicine

## 2014-05-11 ENCOUNTER — Emergency Department (HOSPITAL_COMMUNITY)
Admission: EM | Admit: 2014-05-11 | Discharge: 2014-05-12 | Disposition: A | Discharge status: Other Acute Inpatient Hospital | Payer: BC Managed Care – PPO | Attending: Emergency Medicine | Admitting: Emergency Medicine

## 2014-05-11 DIAGNOSIS — F411 Generalized anxiety disorder: Secondary | ICD-10-CM | POA: Insufficient documentation

## 2014-05-11 DIAGNOSIS — Z862 Personal history of diseases of the blood and blood-forming organs and certain disorders involving the immune mechanism: Secondary | ICD-10-CM | POA: Insufficient documentation

## 2014-05-11 DIAGNOSIS — F3289 Other specified depressive episodes: Secondary | ICD-10-CM | POA: Insufficient documentation

## 2014-05-11 DIAGNOSIS — Z8639 Personal history of other endocrine, nutritional and metabolic disease: Secondary | ICD-10-CM | POA: Diagnosis not present

## 2014-05-11 DIAGNOSIS — R5383 Other fatigue: Secondary | ICD-10-CM

## 2014-05-11 DIAGNOSIS — R45851 Suicidal ideations: Secondary | ICD-10-CM | POA: Insufficient documentation

## 2014-05-11 DIAGNOSIS — IMO0002 Reserved for concepts with insufficient information to code with codable children: Secondary | ICD-10-CM | POA: Insufficient documentation

## 2014-05-11 DIAGNOSIS — F329 Major depressive disorder, single episode, unspecified: Secondary | ICD-10-CM | POA: Insufficient documentation

## 2014-05-11 DIAGNOSIS — F332 Major depressive disorder, recurrent severe without psychotic features: Secondary | ICD-10-CM

## 2014-05-11 DIAGNOSIS — Z7901 Long term (current) use of anticoagulants: Secondary | ICD-10-CM | POA: Diagnosis not present

## 2014-05-11 DIAGNOSIS — R5381 Other malaise: Secondary | ICD-10-CM | POA: Diagnosis not present

## 2014-05-11 DIAGNOSIS — F419 Anxiety disorder, unspecified: Secondary | ICD-10-CM

## 2014-05-11 DIAGNOSIS — Z79899 Other long term (current) drug therapy: Secondary | ICD-10-CM | POA: Diagnosis not present

## 2014-05-11 HISTORY — DX: Anxiety disorder, unspecified: F41.9

## 2014-05-11 HISTORY — DX: Deficiency of other specified B group vitamins: E53.8

## 2014-05-11 LAB — CBC
HEMATOCRIT: 36.3 % (ref 36.0–46.0)
Hemoglobin: 12.2 g/dL (ref 12.0–15.0)
MCH: 30 pg (ref 26.0–34.0)
MCHC: 33.6 g/dL (ref 30.0–36.0)
MCV: 89.2 fL (ref 78.0–100.0)
PLATELETS: 299 10*3/uL (ref 150–400)
RBC: 4.07 MIL/uL (ref 3.87–5.11)
RDW: 13 % (ref 11.5–15.5)
WBC: 8.7 10*3/uL (ref 4.0–10.5)

## 2014-05-11 LAB — COMPREHENSIVE METABOLIC PANEL
ALT: 12 U/L (ref 0–35)
ANION GAP: 15 (ref 5–15)
AST: 15 U/L (ref 0–37)
Albumin: 3.8 g/dL (ref 3.5–5.2)
Alkaline Phosphatase: 83 U/L (ref 39–117)
BUN: 11 mg/dL (ref 6–23)
CALCIUM: 9.5 mg/dL (ref 8.4–10.5)
CO2: 22 meq/L (ref 19–32)
CREATININE: 0.62 mg/dL (ref 0.50–1.10)
Chloride: 101 mEq/L (ref 96–112)
GLUCOSE: 91 mg/dL (ref 70–99)
Potassium: 4.1 mEq/L (ref 3.7–5.3)
Sodium: 138 mEq/L (ref 137–147)
Total Bilirubin: <0.2 mg/dL — ABNORMAL LOW (ref 0.3–1.2)
Total Protein: 7.3 g/dL (ref 6.0–8.3)

## 2014-05-11 LAB — ETHANOL

## 2014-05-11 LAB — RAPID URINE DRUG SCREEN, HOSP PERFORMED
Amphetamines: NOT DETECTED
Barbiturates: NOT DETECTED
Benzodiazepines: NOT DETECTED
Cocaine: NOT DETECTED
Opiates: NOT DETECTED
Tetrahydrocannabinol: NOT DETECTED

## 2014-05-11 LAB — ACETAMINOPHEN LEVEL: Acetaminophen (Tylenol), Serum: <15 ug/mL (ref 10–30)

## 2014-05-11 LAB — SALICYLATE LEVEL

## 2014-05-11 MED ORDER — NICOTINE 21 MG/24HR TD PT24: 21.0000 mg | MEDICATED_PATCH | Freq: Every day | TRANSDERMAL | Status: DC

## 2014-05-11 MED ORDER — ALUM & MAG HYDROXIDE-SIMETH 200-200-20 MG/5ML PO SUSP: 30.0000 mL | ORAL | Status: DC | PRN

## 2014-05-11 MED ORDER — ACETAMINOPHEN 325 MG PO TABS: 650.0000 mg | ORAL_TABLET | ORAL | Status: DC | PRN

## 2014-05-11 MED ORDER — IBUPROFEN 200 MG PO TABS: 600.0000 mg | ORAL_TABLET | Freq: Three times a day (TID) | ORAL | Status: DC | PRN

## 2014-05-11 MED ORDER — VORTIOXETINE HBR 20 MG PO TABS
20.0000 mg | ORAL_TABLET | Freq: Every day | ORAL | Status: DC
  Administered 2014-05-12: 20 mg via ORAL
  Filled 2014-05-11: qty 20

## 2014-05-11 MED ORDER — ONDANSETRON HCL 4 MG PO TABS: 4.0000 mg | ORAL_TABLET | Freq: Three times a day (TID) | ORAL | Status: DC | PRN

## 2014-05-11 MED ORDER — ZOLPIDEM TARTRATE 5 MG PO TABS: 5.0000 mg | ORAL_TABLET | Freq: Every evening | ORAL | Status: DC | PRN

## 2014-05-11 MED ORDER — VORTIOXETINE HBR 10 MG PO TABS
1.0000 | ORAL_TABLET | Freq: Every day | ORAL | Status: DC
  Administered 2014-05-12: 30 mg via ORAL
  Filled 2014-05-11: qty 1

## 2014-05-11 MED ORDER — PERPHENAZINE 4 MG PO TABS
4.0000 mg | ORAL_TABLET | Freq: Every evening | ORAL | Status: DC
  Filled 2014-05-11: qty 1

## 2014-05-11 MED ORDER — LORAZEPAM 1 MG PO TABS
1.0000 mg | ORAL_TABLET | Freq: Three times a day (TID) | ORAL | Status: DC | PRN
  Administered 2014-05-11 – 2014-05-12 (×2): 1 mg via ORAL
  Filled 2014-05-11 (×2): qty 1

## 2014-05-11 NOTE — ED Notes (Signed)
Patient was wanded by security.  Patient has no belongings family took them with them.

## 2014-05-11 NOTE — ED Notes (Signed)
Pt's family states pt was recently IVC'd at [facility] in [place] and was put on prolactin, ativan and brinalex but family states she is not getting any better, has been sleeping a lot and today stated that she was going to lock herself in her room and take all of her medication.

## 2014-05-11 NOTE — ED Provider Notes (Signed)
CSN: [id number]     Arrival date & time 05/11/14  2112 History   First MD Initiated Contact with Patient 05/11/14 2309     This chart was scribed for non-physician practitioner, Antonietta Breach, PA-C working with Wandra Arthurs, MD by Forrestine Him, ED Scribe. This patient was seen in room WBH40/WBH40 and the patient's care was started at 6:34 AM.   Chief Complaint  Patient presents with  . Depression   The history is provided by the patient and a relative. No language interpreter was used.    HPI Comments: Toni Alvarado is a 48 y.o. female who presents to the Emergency Department complaining of anxiety and depression that has worsened in the last 6 days. She attributes worsening symptoms to a number of new events occuring in her life. Pt reports a recent anxiety attack that lasted for about 3 hours last week. At that time pt was seen at [facility] in [place]. IVC papers were taken out on her secondary to mentioning possibly SI. Pt was placed on Prolactin, Ativan, and Brinalex during visit. However, pt denies much improvement from symptoms. Toni Alvarado was seen by her Psychiatrist for follow up yesterday and was started on Perphenazine and Ativan. Since starting new medications, pt reports ongoing, constant fatigue. This afternoon prior to arrival pt stated she would "lock herself in her room and take all of her medication". No other concerns this visit.   Past Medical History  Diagnosis Date  . Anemia   . Depression   . B12 deficiency   . Anxiety    Past Surgical History  Procedure Laterality Date  . Lithotripsy     Family History  Problem Relation Age of Onset  . Hyperlipidemia Mother   . Mental illness Mother   . Diabetes Father   . Heart disease Father   . Hyperlipidemia Father   . Stroke Father   . Mental illness Father   . Mental illness Brother   . Cancer Brother   . Breast cancer Sister    History  Substance Use Topics  . Smoking status: Never Smoker   . Smokeless  tobacco: Never Used  . Alcohol Use: No   OB History   Grav Para Term Preterm Abortions TAB SAB Ect Mult Living                  Review of Systems  Constitutional: Positive for fatigue.  Psychiatric/Behavioral: Positive for suicidal ideas. The patient is nervous/anxious.   All other systems reviewed and are negative.   Allergies  Review of patient's allergies indicates no known allergies.  Home Medications   Prior to Admission medications   Medication Sig Start Date End Date Taking? Authorizing Provider  LORazepam (ATIVAN) 1 MG tablet Take 1 mg by mouth every 6 (six) hours as needed for anxiety.   Yes Historical Provider, MD  perphenazine (TRILAFON) 8 MG tablet Take 4 mg by mouth every evening.   Yes Historical Provider, MD  Vortioxetine HBr (BRINTELLIX) 10 MG TABS Take 1 tablet by mouth daily. Take along with the 20 mg to equal 30 mg daily.   Yes Historical Provider, MD  Vortioxetine HBr (BRINTELLIX) 20 MG TABS Take 20 mg by mouth daily. Takes with the 10 mg to equal 30 mg daily.   Yes Historical Provider, MD   Triage Vitals: BP 105/59  Pulse 77  Temp(Src) 97 F (36.1 C) (Oral)  Resp 18  SpO2 100%  LMP 05/09/2014   Physical Exam  Nursing  note and vitals reviewed. Constitutional: She is oriented to person, place, and time. She appears well-developed and well-nourished. No distress.  Nontoxic/nonseptic appearing  HENT:  Head: Normocephalic and atraumatic.  Eyes: Conjunctivae and EOM are normal. No scleral icterus.  Neck: Normal range of motion.  Cardiovascular: Normal rate, regular rhythm and normal heart sounds.   Pulmonary/Chest: Effort normal. No respiratory distress.  Abdominal: She exhibits no distension.  Musculoskeletal: Normal range of motion.  Neurological: She is alert and oriented to person, place, and time. She exhibits normal muscle tone. Coordination normal.  GCS 15. Speech is goal oriented. Patient moves extremities without ataxia.  Skin: Skin is warm and  dry. No rash noted. She is not diaphoretic. No erythema. No pallor.  Psychiatric: Her mood appears anxious. Her speech is rapid and/or pressured. She is agitated. She expresses no homicidal and no suicidal ideation. She expresses no suicidal plans and no homicidal plans.    ED Course  Procedures (including critical care time)  DIAGNOSTIC STUDIES: Oxygen Saturation is 99% on RA, Normal by my interpretation.    COORDINATION OF CARE: 6:34 AM-Discussed treatment plan with pt at bedside and pt agreed to plan.     Labs Review Labs Reviewed  COMPREHENSIVE METABOLIC PANEL - Abnormal; Notable for the following:    Total Bilirubin <0.2 (*)    All other components within normal limits  SALICYLATE LEVEL - Abnormal; Notable for the following:    Salicylate Lvl <4.0 (*)    All other components within normal limits  ACETAMINOPHEN LEVEL  CBC  ETHANOL  URINE RAPID DRUG SCREEN (HOSP PERFORMED)    Imaging Review No results found.   EKG Interpretation None      MDM   Final diagnoses:  Anxiety    48 year old female presents to the emergency department for worsening anxiety. He should recently with hospitalization at [facility] in [place]. Patient states the regimen which she was started on during this admission has not been helping her anxiety. Patient denies SI/HI. Labs reviewed he had patient medically cleared.  Patient evaluated by TTS. Patient meets criteria for inpatient treatment program. Patient currently pending bed availability; will likely transfer once bed becomes available.  I personally performed the services described in this documentation, which was scribed in my presence. The recorded information has been reviewed and is accurate.     Antonietta Breach, [place] 05/12/14 [phone]

## 2014-05-12 ENCOUNTER — Encounter (HOSPITAL_COMMUNITY): Payer: Self-pay | Admitting: *Deleted

## 2014-05-12 ENCOUNTER — Inpatient Hospital Stay (HOSPITAL_COMMUNITY)
Admission: EM | Admit: 2014-05-12 | Discharge: 2014-05-17 | DRG: 885 | Disposition: A | Discharge status: Home / Self Care | Payer: BC Managed Care – PPO | Source: Intra-hospital | Attending: Psychiatry | Admitting: Psychiatry

## 2014-05-12 ENCOUNTER — Encounter (HOSPITAL_COMMUNITY): Payer: Self-pay | Admitting: Emergency Medicine

## 2014-05-12 DIAGNOSIS — IMO0002 Reserved for concepts with insufficient information to code with codable children: Secondary | ICD-10-CM | POA: Diagnosis not present

## 2014-05-12 DIAGNOSIS — F411 Generalized anxiety disorder: Secondary | ICD-10-CM

## 2014-05-12 DIAGNOSIS — Z8249 Family history of ischemic heart disease and other diseases of the circulatory system: Secondary | ICD-10-CM | POA: Diagnosis not present

## 2014-05-12 DIAGNOSIS — F410 Panic disorder [episodic paroxysmal anxiety] without agoraphobia: Secondary | ICD-10-CM | POA: Diagnosis present

## 2014-05-12 DIAGNOSIS — Z833 Family history of diabetes mellitus: Secondary | ICD-10-CM | POA: Diagnosis not present

## 2014-05-12 DIAGNOSIS — Z823 Family history of stroke: Secondary | ICD-10-CM | POA: Diagnosis not present

## 2014-05-12 DIAGNOSIS — Z803 Family history of malignant neoplasm of breast: Secondary | ICD-10-CM

## 2014-05-12 DIAGNOSIS — R45851 Suicidal ideations: Secondary | ICD-10-CM

## 2014-05-12 DIAGNOSIS — F332 Major depressive disorder, recurrent severe without psychotic features: Principal | ICD-10-CM

## 2014-05-12 DIAGNOSIS — G4700 Insomnia, unspecified: Secondary | ICD-10-CM | POA: Diagnosis present

## 2014-05-12 HISTORY — DX: Suicidal ideations: R45.851

## 2014-05-12 MED ORDER — NICOTINE 21 MG/24HR TD PT24
21.0000 mg | MEDICATED_PATCH | Freq: Every day | TRANSDERMAL | Status: DC
  Filled 2014-05-12 (×2): qty 1

## 2014-05-12 MED ORDER — HYDROXYZINE HCL 25 MG PO TABS
25.0000 mg | ORAL_TABLET | Freq: Four times a day (QID) | ORAL | Status: DC | PRN
  Administered 2014-05-13 – 2014-05-15 (×4): 25 mg via ORAL
  Filled 2014-05-12 (×3): qty 1
  Filled 2014-05-12: qty 10
  Filled 2014-05-12: qty 1

## 2014-05-12 MED ORDER — ACETAMINOPHEN 325 MG PO TABS
650.0000 mg | ORAL_TABLET | ORAL | Status: DC | PRN
Start: 2014-05-12 — End: 2014-05-12

## 2014-05-12 MED ORDER — PERPHENAZINE 4 MG PO TABS
4.0000 mg | ORAL_TABLET | Freq: Every evening | ORAL | Status: DC
  Administered 2014-05-12: 4 mg via ORAL
  Filled 2014-05-12 (×4): qty 1

## 2014-05-12 MED ORDER — ONDANSETRON HCL 4 MG PO TABS: 4.0000 mg | ORAL_TABLET | Freq: Three times a day (TID) | ORAL | Status: DC | PRN

## 2014-05-12 MED ORDER — VORTIOXETINE HBR 10 MG PO TABS: 1.0000 | ORAL_TABLET | Freq: Every day | ORAL | Status: DC

## 2014-05-12 MED ORDER — VORTIOXETINE HBR 20 MG PO TABS
20.0000 mg | ORAL_TABLET | Freq: Every day | ORAL | Status: DC
  Filled 2014-05-12: qty 20

## 2014-05-12 MED ORDER — MAGNESIUM HYDROXIDE 400 MG/5ML PO SUSP: 30.0000 mL | Freq: Every day | ORAL | Status: DC | PRN

## 2014-05-12 MED ORDER — ZOLPIDEM TARTRATE 5 MG PO TABS
5.0000 mg | ORAL_TABLET | Freq: Every evening | ORAL | Status: DC | PRN
  Administered 2014-05-12: 5 mg via ORAL
  Filled 2014-05-12: qty 1

## 2014-05-12 MED ORDER — VORTIOXETINE HBR 10 MG PO TABS
30.0000 mg | ORAL_TABLET | Freq: Every day | ORAL | Status: DC
  Administered 2014-05-13 – 2014-05-17 (×5): 30 mg via ORAL
  Filled 2014-05-12 (×4): qty 3
  Filled 2014-05-12: qty 21
  Filled 2014-05-12 (×2): qty 3

## 2014-05-12 MED ORDER — ACETAMINOPHEN 325 MG PO TABS: 650.0000 mg | ORAL_TABLET | Freq: Four times a day (QID) | ORAL | Status: DC | PRN

## 2014-05-12 MED ORDER — LORAZEPAM 1 MG PO TABS: 1.0000 mg | ORAL_TABLET | Freq: Three times a day (TID) | ORAL | Status: DC | PRN

## 2014-05-12 MED ORDER — ALUM & MAG HYDROXIDE-SIMETH 200-200-20 MG/5ML PO SUSP: 30.0000 mL | ORAL | Status: DC | PRN

## 2014-05-12 NOTE — Progress Notes (Signed)
Pt is on the Adult unit.  She reported that her depression and anxiety has been increasing over the last week with panic attacks related to her "stress" she is starting a new teaching job.  Pt has been thinking suicidal thoughts off and on.  She has no plan here in the hospital and does contract to come to staff.  She has had at least one other suicidal in 1991 by over dose. She hates to e alone she is very anxious most of the day. Pt feels her medications are not working for her. She has recently started a medication and she is unsure what the name so she plans to talk with the doctor tomorrow  She denied any S.H ideation or A/V/H.  She was oriented to the milieu and pointed out that she has the schedule in her blue folder along with it on the hallway across from the dayroom.
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
## 2014-05-12 NOTE — Tx Team (Signed)
Initial Interdisciplinary Treatment Plan   PATIENT STRESSORS: Financial difficulties Marital or family conflict Medication change or noncompliance   PROBLEM LIST: Problem List/Patient Goals Date to be addressed Date deferred Reason deferred Estimated date of resolution  "manage my anxiety" 05/12/2014     "get skills here to help me" 05/12/2014                                                DISCHARGE CRITERIA:  Ability to meet basic life and health needs Improved stabilization in mood, thinking, and/or behavior Motivation to continue treatment in a less acute level of care Need for constant or close observation no longer present Verbal commitment to aftercare and medication compliance  PRELIMINARY DISCHARGE PLAN: Attend aftercare/continuing care group Outpatient therapy Return to previous living arrangement  PATIENT/FAMIILY INVOLVEMENT: This treatment plan has been presented to and reviewed with the patient  The patient has been given the opportunity to ask questions and make suggestions.  Karel Jarvis 05/12/2014, 3:56 PM

## 2014-05-12 NOTE — Consult Note (Signed)
  Toni Alvarado says she is no longer suicidal and does not want to go to inpatient.  She has anxiety and inpatient makes it worse.  She is still quite depressed and I think she would be a risk to be discharged home.  She has been accepted to [facility] for treatment of depression and suicidal ideation.

## 2014-05-12 NOTE — BHH Group Notes (Signed)
[place] LCSW Group Therapy  Emotional Regulation 1:15 - 2:30 PM   05/12/2014 2:59 PM  Type of Therapy:  Group Therapy  Participation Level:  Patient did not attend group. She advised of just admitting and not feeling well.    Concha Pyo 05/12/2014, 2:59 PM

## 2014-05-12 NOTE — ED Provider Notes (Signed)
Medical screening examination/treatment/procedure(s) were performed by non-physician practitioner and as supervising physician I was immediately available for consultation/collaboration.   EKG Interpretation None        Wandra Arthurs, MD 05/12/14 0730

## 2014-05-12 NOTE — BH Assessment (Signed)
The patient reports the she may want to go to IOP instead of [facility].  The Psychiatrist will assess and disposition.

## 2014-05-13 ENCOUNTER — Encounter (HOSPITAL_COMMUNITY): Payer: Self-pay | Admitting: Psychiatry

## 2014-05-13 DIAGNOSIS — F332 Major depressive disorder, recurrent severe without psychotic features: Secondary | ICD-10-CM

## 2014-05-13 DIAGNOSIS — F411 Generalized anxiety disorder: Secondary | ICD-10-CM

## 2014-05-13 HISTORY — DX: Major depressive disorder, recurrent severe without psychotic features: F33.2

## 2014-05-13 HISTORY — DX: Generalized anxiety disorder: F41.1

## 2014-05-13 LAB — TSH: TSH: 2.18 u[IU]/mL (ref 0.350–4.500)

## 2014-05-13 MED ORDER — ZOLPIDEM TARTRATE 10 MG PO TABS
10.0000 mg | ORAL_TABLET | Freq: Every evening | ORAL | Status: DC | PRN
  Administered 2014-05-13 – 2014-05-14 (×2): 10 mg via ORAL
  Filled 2014-05-13 (×2): qty 1

## 2014-05-13 MED ORDER — LORAZEPAM 1 MG PO TABS
1.0000 mg | ORAL_TABLET | Freq: Four times a day (QID) | ORAL | Status: DC | PRN
  Administered 2014-05-14 – 2014-05-17 (×3): 1 mg via ORAL
  Filled 2014-05-13 (×3): qty 1
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
## 2014-05-13 NOTE — BHH Group Notes (Signed)
[place] Group Notes:  (Nursing/MHT/Case Management/Adjunct)  Date:  05/13/2014  Time:    Type of Therapy:  Nurse Education  Participation Level:  Active  Participation Quality:  Appropriate  Affect:  Appropriate  Cognitive:  Appropriate  Insight:  Appropriate  Engagement in Group:  Engaged  Modes of Intervention:  Clarification and Support  Summary of Progress/Problems: Morning wellness  Franciso Bend 05/13/2014, 2:55 PM

## 2014-05-13 NOTE — Progress Notes (Signed)
D. Pt has been up and has been visible in milieu this evening, has attended and participated in various milieu activities. Pt appears depressed and flat on the unit and has endorsed feelings of depression and hopelessness and feeling overwhelmed with things going on in her life. A. Support and encouragement provided. R. Safety maintained, will continue to monitor.

## 2014-05-13 NOTE — BHH Suicide Risk Assessment (Signed)
[place] INPATIENT:  Family/Significant Other Suicide Prevention Education  Suicide Prevention Education:  Education Completed; [name], Sister, [phone]; has been identified by the patient as the family member/significant other with whom the patient will be residing, and identified as the person(s) who will aid the patient in the event of a mental health crisis (suicidal ideations/suicide attempt).  With written consent from the patient, the family member/significant other has been provided the following suicide prevention education, prior to the and/or following the discharge of the patient.  The suicide prevention education provided includes the following:  Suicide risk factors  Suicide prevention and interventions  National Suicide Hotline telephone number  [facility] assessment telephone number  [facility] Emergency Assistance [place] and/or Residential Mobile Crisis Unit telephone number  Request made of family/significant other to:  Remove weapons (e.g., guns, rifles, knives), all items previously/currently identified as safety concern.   Sister advised patient does not have access to weapons.     Remove drugs/medications (over-the-counter, prescriptions, illicit drugs), all items previously/currently identified as a safety concern.  The family member/significant other verbalizes understanding of the suicide prevention education information provided.  The family member/significant other agrees to remove the items of safety concern listed above.  Toni Alvarado 05/13/2014, 12:35 PM

## 2014-05-13 NOTE — BHH Group Notes (Signed)
[place] LCSW Group Therapy 05/13/2014  1:15 pm   Type of Therapy: Group Therapy Participation Level: Active  Participation Quality: Attentive, Sharing and Supportive  Affect: Depressed and Flat  Cognitive: Alert and Oriented  Insight: Developing/Improving and Engaged  Engagement in Therapy: Developing/Improving and Engaged  Modes of Intervention: Clarification, Confrontation, Discussion, Education, Exploration, Limit-setting, Orientation, Problem-solving, Rapport Building, Art therapist, Socialization and Support  Summary of Progress/Problems: The topic for group was balance in life. Today's group focused on defining balance in one's own words, identifying things that can knock one off balance, and exploring healthy ways to maintain balance in life. Group members were asked to provide an example of a time when they felt off balance, describe how they handled that situation,and process healthier ways to regain balance in the future. Group members were asked to share the most important tool for maintaining balance that they learned while at [facility] and how they plan to apply this method after discharge. 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 Patient discussed feeling overwhelmed by work and long hours. CSW's and other group members provided emotional support and encouragement.  Tilden Fossa, MSW, [place] Worker [facility] [phone]  \

## 2014-05-13 NOTE — Progress Notes (Signed)
D: Patient has depressed affect and anxious mood. She reported on the self inventory sheet that sleep is fair, poor appetite and ability to concentrate and low energy level. Patient rates depression "8", feelings of hopelessness "7" and anxiety "6-8". Writer observed that the patient has been extremely anxious and tearful throughout the day. Patient is compliant with medication regimen.  A: Support and encouragement provided to patient. PRN Vistaril given for anxiety. Scheduled medications administered per MD orders. Maintain Q15 minute checks for safety.  R: Patient receptive. Denies SI/HI and AVH. Patient remains safe on the unit.

## 2014-05-13 NOTE — Progress Notes (Signed)
D: Pt presents flat in affect and anxious in mood. Pt did not attend AA/NA this evening. Pt denies any SI/HI/AVH. Pt was minimally present within the milieu.  A: Writer administered Ambien to aid in pt's sleep this evening. Continued support and availability as needed was extended to this pt. Staff continue to monitor pt with q32min checks.  R: No adverse drug reactions noted. Pt receptive to treatment. Pt remains safe at this time.

## 2014-05-13 NOTE — BHH Suicide Risk Assessment (Signed)
Suicide Risk Assessment  Admission Assessment     Nursing information obtained from:    Demographic factors:    Current Mental Status:    Loss Factors:    Historical Factors:    Risk Reduction Factors:    Total Time spent with patient: 45 minutes  CLINICAL FACTORS:   Severe Anxiety and/or Agitation Depression:   Severe  Psychiatric Specialty Exam:     Blood pressure 102/75, pulse 93, temperature 98.1 F (36.7 C), temperature source Oral, resp. rate 18, height 5\' 3"  (1.6 m), weight 83.008 kg (183 lb), last menstrual period 05/09/2014, SpO2 100.00%.Body mass index is 32.43 kg/(m^2).  General Appearance: Fairly Groomed  Engineer, water::  Fair  Speech:  Clear and Coherent, Slow and not spontaneous  Volume:  Decreased  Mood:  Anxious and Depressed  Affect:  Depressed and Tearful  Thought Process:  Coherent and Goal Directed  Orientation:  Full (Time, Place, and Person)  Thought Content:  symptoms worries concerns  Suicidal Thoughts:  Yes.  without intent/plan  Homicidal Thoughts:  No  Memory:  Immediate;   Fair Recent;   Fair Remote;   Fair  Judgement:  Fair  Insight:  Present  Psychomotor Activity:  Restlessness  Concentration:  Fair  Recall:  [employer] of Knowledge:NA  Language: Fair  Akathisia:  No  Handed:    AIMS (if indicated):     Assets:  Desire for Improvement Housing Vocational/Educational  Sleep:  Number of Hours: 6.75   Musculoskeletal: Strength & Muscle Tone: within normal limits Gait & Station: normal Patient leans: N/A  COGNITIVE FEATURES THAT CONTRIBUTE TO RISK:  Closed-mindedness Polarized thinking Thought constriction (tunnel vision)    SUICIDE RISK:   Moderate:  Frequent suicidal ideation with limited intensity, and duration, some specificity in terms of plans, no associated intent, good self-control, limited dysphoria/symptomatology, some risk factors present, and identifiable protective factors, including available and accessible social  support.  PLAN OF CARE: Supportive approach/coping skills/CBT;mindfulness/stress management                               Has been on Effexor, Wellbutrin, Prozac, Zoloft, Risperdal, Paxil, Lithium                               States the Brintellix seemed to have help in the beginning                                Will add Abilify 2 mg to augment                               Will check B12, Vit D, TSH to R/O other causes                               Will use Ativan to help with the acute anxiety I certify that inpatient services furnished can reasonably be expected to improve the patient's condition.  Toni Alvarado A 05/13/2014, 4:53 PM

## 2014-05-13 NOTE — Progress Notes (Signed)
Pt attended karaoke group this evening.  

## 2014-05-14 LAB — VITAMIN B12: Vitamin B-12: 523 pg/mL (ref 211–911)

## 2014-05-14 MED ORDER — ARIPIPRAZOLE 2 MG PO TABS
2.0000 mg | ORAL_TABLET | Freq: Every day | ORAL | Status: DC
  Administered 2014-05-14: 2 mg via ORAL
  Filled 2014-05-14 (×3): qty 1

## 2014-05-14 MED ORDER — CALCIUM CARBONATE-VITAMIN D 500-200 MG-UNIT PO TABS
1.0000 | ORAL_TABLET | Freq: Two times a day (BID) | ORAL | Status: DC
  Administered 2014-05-14 – 2014-05-17 (×6): 1 via ORAL
  Filled 2014-05-14 (×3): qty 1
  Filled 2014-05-14: qty 8
  Filled 2014-05-14 (×5): qty 1
  Filled 2014-05-14: qty 8

## 2014-05-14 MED ORDER — PERPHENAZINE 4 MG PO TABS: 4.0000 mg | ORAL_TABLET | Freq: Two times a day (BID) | ORAL | Status: DC

## 2014-05-14 MED ORDER — SENNA 8.6 MG PO TABS
1.0000 | ORAL_TABLET | Freq: Every day | ORAL | Status: DC
  Administered 2014-05-16 – 2014-05-17 (×2): 8.6 mg via ORAL
  Filled 2014-05-14 (×6): qty 1

## 2014-05-14 MED ORDER — CYANOCOBALAMIN 1000 MCG/ML IJ SOLN: 1000.0000 ug | Freq: Once | INTRAMUSCULAR | Status: DC

## 2014-05-14 MED ORDER — CYANOCOBALAMIN 1000 MCG/ML IJ SOLN
1000.0000 ug | Freq: Once | INTRAMUSCULAR | Status: AC
  Administered 2014-05-14: 1000 ug via INTRAMUSCULAR
  Filled 2014-05-14: qty 1

## 2014-05-14 MED ORDER — PERPHENAZINE 4 MG PO TABS
4.0000 mg | ORAL_TABLET | Freq: Every day | ORAL | Status: DC
  Administered 2014-05-14 – 2014-05-16 (×3): 4 mg via ORAL
  Filled 2014-05-14: qty 1
  Filled 2014-05-14: qty 4
  Filled 2014-05-14 (×3): qty 1

## 2014-05-14 NOTE — Progress Notes (Signed)
[facility] MD Progress Note  05/14/2014 5:50 PM Toni Alvarado  MRN:  [id number] Subjective:  Toni Alvarado continues to have a hard time. States she has been trying to make it for a while but she cant anymore. She is experiencing a lot of anxiety, worry, feeling very overwhelmed. States this is not like her.  Spoke with Dr. Clovis Pu her psychiatrist who states she saw before she was admitted and she was very disorganized behaving in a regressed childish way. He states he has approach the topic with her of trying an antipsychotic. She was hesitant. He was the one who started her on Trilafon and not the place she went in crisis closer to her job. She has been on Risperdal but he does not remember why it was D/C. The Abilify was D/C a she experienced SI.  Diagnosis:   DSM5: Deressive Disorders:  Major Depressive Disorder - Severe (296.23) Total Time spent with patient: 30 minutes  Axis I: Generalized Anxiety Disorder  ADL's:  Intact  Sleep: Fair  Appetite:  Fair  Psychiatric Specialty Exam: Physical Exam  Review of Systems  Constitutional: Negative.   HENT: Negative.   Eyes: Negative.   Respiratory: Negative.   Cardiovascular: Negative.   Gastrointestinal: Negative.   Genitourinary: Negative.   Musculoskeletal: Negative.   Skin: Negative.   Neurological: Negative.   Endo/Heme/Allergies: Negative.   Psychiatric/Behavioral: Positive for depression and suicidal ideas. The patient is nervous/anxious.     Blood pressure 105/68, pulse 82, temperature 97.6 F (36.4 C), temperature source Oral, resp. rate 16, height 5\' 3"  (1.6 m), weight 83.008 kg (183 lb), last menstrual period 05/09/2014, SpO2 100.00%.Body mass index is 32.43 kg/(m^2).  General Appearance: Disheveled  Eye Sport and exercise psychologist::  Fair  Speech:  Clear and Coherent  Volume:  fluctuates  Mood:  Anxious, Depressed, Hopeless and Worthless  Affect:  Labile and Tearful  Thought Process:  Coherent and Goal Directed  Orientation:  Full (Time, Place, and  Person)  Thought Content:  events symptoms worries concerns  Suicidal Thoughts:  No  Homicidal Thoughts:  No  Memory:  Immediate;   Fair Recent;   Fair Remote;   Fair  Judgement:  Fair  Insight:  Present  Psychomotor Activity:  Restlessness  Concentration:  states she gets so overwhelmed she cant focus  Recall:  [place]  Language: Fair  Akathisia:  No  Handed:    AIMS (if indicated):     Assets:  Desire for Improvement Housing Social Support Vocational/Educational  Sleep:  Number of Hours: 6.25   Musculoskeletal: Strength & Muscle Tone: within normal limits Gait & Station: normal Patient leans: N/A  Current Medications: Current Facility-Administered Medications  Medication Dose Route Frequency Provider Last Rate Last Dose  . acetaminophen (TYLENOL) tablet 650 mg  650 mg Oral Q6H PRN Benjamine Mola, FNP      . calcium-vitamin D (OSCAL WITH D) 500-200 MG-UNIT per tablet 1 tablet  1 tablet Oral BID Encarnacion Slates, NP   1 tablet at 05/14/14 1627  . hydrOXYzine (ATARAX/VISTARIL) tablet 25 mg  25 mg Oral Q6H PRN Benjamine Mola, FNP   25 mg at 05/13/14 0759  . LORazepam (ATIVAN) tablet 1 mg  1 mg Oral Q6H PRN Nicholaus Bloom, MD      . magnesium hydroxide (MILK OF MAGNESIA) suspension 30 mL  30 mL Oral Daily PRN Benjamine Mola, FNP      . ondansetron [facility]) tablet 4 mg  4 mg Oral Q8H PRN  Clarene Reamer, MD      . perphenazine (TRILAFON) tablet 4 mg  4 mg Oral QHS Nicholaus Bloom, MD      . [facility]) tablet 8.6 mg  1 tablet Oral Daily Encarnacion Slates, NP      . Vortioxetine HBr TABS 30 mg  30 mg Oral Daily Nicholaus Bloom, MD   30 mg at 05/14/14 0813  . zolpidem (AMBIEN) tablet 10 mg  10 mg Oral QHS PRN Nicholaus Bloom, MD   10 mg at 05/13/14 2142    Lab Results:  Results for orders placed during the hospital encounter of 05/12/14 (from the past 48 hour(s))  VITAMIN B12     Status: None   Collection Time    05/13/14  8:15 PM      Result Value Ref Range    Vitamin B-12 523  211 - 911 pg/mL   Comment: Performed at [facility]  TSH     Status: None   Collection Time    05/13/14  8:15 PM      Result Value Ref Range   TSH 2.180  0.350 - 4.500 uIU/mL   Comment: Performed at [facility]    Physical Findings: AIMS:  , ,  ,  ,    CIWA:    COWS:     Treatment Plan Summary: Daily contact with patient to assess and evaluate symptoms and progress in treatment Medication management  Plan: Supportive approach/coping skills           CBT;mindfulness, stress management           Will dicuss above information with patient           D/C the Abilify           Resume the Trilafon  Medical Decision Making Problem Points:  Established problem, worsening (2) and Review of psycho-social stressors (1) Data Points:  Review of medication regiment & side effects (2) Review of new medications or change in dosage (2)  I certify that inpatient services furnished can reasonably be expected to improve the patient's condition.   Salvatore Shear A 05/14/2014, 5:50 PM

## 2014-05-14 NOTE — BHH Group Notes (Signed)
[facility] LCSW Aftercare Discharge Planning Group Note   05/14/2014 8:24 AM    Participation Quality:  Appropraite  Mood/Affect:  Appropriate  Depression Rating:  6  Anxiety Rating:  5  Thoughts of Suicide:  No  Will you contract for safety?   NA  Current AVH:  No  Plan for Discharge/Comments:  Patient attended discharge planning group and actively participated in group.  Patient will return to her home and follow up with [facility] and [facility].   CSW provided all participants with daily workbook.   Transportation Means: Patient has transportation.   Supports:  Patient has a support system.   Toni Alvarado, Eulas Post

## 2014-05-14 NOTE — Clinical Social Work Note (Signed)
Per patient's request, CSW spoke with patient's supervisor, Clover Mealy, to advised of patient being in the hospital.  Mr. Higinio Plan advised he has been in contact with patient daughter.  He asked if there is a return to work date to which CSW advised of not knowing at this time.

## 2014-05-14 NOTE — Progress Notes (Signed)
D: Pt in bed resting with eyes closed. Respirations even and unlabored. Pt appears to be in no signs of distress at this time. A: Q15min checks remains for this pt. R: Pt remains safe at this time.   

## 2014-05-14 NOTE — Tx Team (Signed)
Interdisciplinary Treatment Plan Update   Date Reviewed:  05/14/2014  Time Reviewed:  9:51 AM  Progress in Treatment:   Attending groups: Yes Participating in groups: Yes Taking medication as prescribed: Yes  Tolerating medication: Yes Family/Significant other contact made: Yes, collateral contact with sister Patient understands diagnosis: Yes  Discussing patient identified problems/goals with staff: Yes Medical problems stabilized or resolved: Yes Denies suicidal/homicidal ideation: Yes Patient has not harmed self or others: Yes  For review of initial/current patient goals, please see plan of care.  Estimated Length of Stay:  3-4 days  Reasons for Continued Hospitalization:  Anxiety Depression Medication stabilization   New Problems/Goals identified:    Discharge Plan or Barriers:   Home with outpatient follow up with [facility] and [facility]  Additional Comments:  Patient and CSW reviewed patient's identified goals and treatment plan.  Patient verbalized understanding and agreed to treatment plan.   Attendees:  Patient:  05/14/2014 9:51 AM   Signature:  Gabriel Earing, MD 05/14/2014 9:51 AM  Signature: Carlton Adam, MD 05/14/2014 9:51 AM  Signature:  Talbert Cage, RN 05/14/2014 9:51 AM  Signature: Drake Leach, RN 05/14/2014 9:51 AM  Signature:  05/14/2014 9:51 AM  Signature:  Joette Catching, LCSW 05/14/2014 9:51 AM  Signature:  Erasmo Downer Drinkard, LCSW-A 05/14/2014 9:51 AM  Signature:  Lucinda Dell, Care Coordinator [facility] 05/14/2014 9:51 AM  Signature:   05/14/2014 9:51 AM  Signature 05/14/2014  9:51 AM  Signature:   Lars Pinks, RN [facility] 05/14/2014  9:51 AM  Signature:  [place] Worker LCSW 05/14/2014  9:51 AM    Scribe for Treatment Team:   Joette Catching,  05/14/2014 9:51 AM

## 2014-05-14 NOTE — BHH Group Notes (Signed)
Adult Psychoeducational Group Note  Date:  05/14/2014 Time:  9:58 PM  Group Topic/Focus:  AA Meeting  Participation Level:  Did Not Attend  Participation Quality:  None  Affect:  None  Cognitive:  None  Insight: None  Engagement in Group:  None  Modes of Intervention:  Discussion and Education  Additional Comments:  Toni Alvarado didn't attend group.  [name] A 05/14/2014, 9:58 PM

## 2014-05-14 NOTE — BHH Group Notes (Signed)
[facility] LCSW Group Therapy  Feelings Around Relapse 1:15 -2:30        05/14/2014   Type of Therapy:  Group Therapy  Participation Level:  Appropriate  Participation Quality:  Appropriate  Affect:  Appropriate  Cognitive:  Attentive Appropriate  Insight:  Developing/Improving  Engagement in Therapy: Developing/Improving  Modes of Intervention:  Discussion Exploration Problem-Solving Supportive  Summary of Progress/Problems:  The topic for today was feelings around relapse.  Patient processed feelings toward relapse and was able to relate to peers. Patient advised she would be isolating herself if she relapsed.  Patient advised she has to learn to live by the rule of other teachers "leave by five to stay alive." Patient identified coping skills that can be used to prevent a relapse.   Toni Alvarado 05/14/2014

## 2014-05-14 NOTE — Progress Notes (Addendum)
Nursing Shift Note D: pt reports she slept fair, good appetite, normal energy and good concentration, depression rated #5 with low anxiety. Depressed and flat mood. Denies pain, SI, HI, AVH. Pt daily goal is to stay active and to make positive plans, journal and to read. A: did attend group. Medication compliant. Continue to monitor for safety. R: continue to monitor and stabilize.

## 2014-05-15 DIAGNOSIS — F411 Generalized anxiety disorder: Secondary | ICD-10-CM

## 2014-05-15 MED ORDER — TRAZODONE HCL 50 MG PO TABS
50.0000 mg | ORAL_TABLET | Freq: Every day | ORAL | Status: DC
  Administered 2014-05-15 – 2014-05-16 (×2): 50 mg via ORAL
  Filled 2014-05-15: qty 4
  Filled 2014-05-15 (×5): qty 1

## 2014-05-15 MED ORDER — ALUM & MAG HYDROXIDE-SIMETH 200-200-20 MG/5ML PO SUSP: 15.0000 mL | Freq: Four times a day (QID) | ORAL | Status: DC | PRN

## 2014-05-15 NOTE — Progress Notes (Signed)
Did not attended group 

## 2014-05-15 NOTE — Plan of Care (Signed)
Problem: Diagnosis: Increased Risk For Suicide Attempt Goal: LTG-Patient Will Report Improved Mood and Deny Suicidal LTG (by discharge) Patient will report improved mood and deny suicidal ideation.  Outcome: Progressing Pt denies suicidal ideation, mood better as shift progressed

## 2014-05-15 NOTE — Progress Notes (Signed)
[facility] MD Progress Note  05/15/2014 12:36 PM Toni Alvarado  MRN:  [id number]  Subjective:   I cannot sleep very well.  I'm taking too much medication but it is making me tired .  I still have a lot of ruminative thoughts.    Objective: Patient seen chart reviewed.  The patient is complaining of poor sleep and feeling tired.  Despite taking Ambien Vistaril and Trilafon she continued to endorse racing thoughts and poor sleep.  She continued to experience anxiety symptoms and sometimes feeling hopeless.  However she wants to give more time to this new medication .  She has no tremors or side effects .  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  Patient told that she was getting overwhelmed at home because of educational assignment.  Patient denies any hallucination or any paranoia.  She is going to groups.  Diagnosis:   DSM5: Deressive Disorders:  Major Depressive Disorder - Severe (296.23) Total Time spent with patient: 30 minutes  Axis I: Generalized Anxiety Disorder  ADL's:  Intact  Sleep: Fair  Appetite:  Fair  Psychiatric Specialty Exam: Physical Exam  Review of Systems  Constitutional: Negative.   HENT: Negative.   Eyes: Negative.   Respiratory: Negative.   Cardiovascular: Negative.   Gastrointestinal: Negative.   Genitourinary: Negative.   Musculoskeletal: Negative.   Skin: Negative.   Neurological: Negative.   Endo/Heme/Allergies: Negative.   Psychiatric/Behavioral: Positive for depression and suicidal ideas. The patient is nervous/anxious.     Blood pressure 105/73, pulse 89, temperature 98.5 F (36.9 C), temperature source Oral, resp. rate 16, height 5\' 3"  (1.6 m), weight 183 lb (83.008 kg), last menstrual period 05/09/2014, SpO2 100.00%.Body mass index is 32.43 kg/(m^2).  General Appearance: Casual  Eye Contact::  Fair  Speech:  Clear and Coherent  Volume:  fluctuates  Mood:  Anxious, Depressed, Hopeless and Worthless  Affect:   Constricted and Depressed  Thought Process:  Coherent and Goal Directed  Orientation:  Full (Time, Place, and Person)  Thought Content:  events symptoms worries concerns  Suicidal Thoughts:  No  Homicidal Thoughts:  No  Memory:  Immediate;   Fair Recent;   Fair Remote;   Fair  Judgement:  Fair  Insight:  Present  Psychomotor Activity:  Restlessness  Concentration:  Fair  Recall:  [employer] of Knowledge:NA  Language: Fair  Akathisia:  No  Handed:    AIMS (if indicated):     Assets:  Desire for Improvement Housing Social Support Vocational/Educational  Sleep:  Number of Hours: 6.5   Musculoskeletal: Strength & Muscle Tone: within normal limits Gait & Station: normal Patient leans: N/A  Current Medications: Current Facility-Administered Medications  Medication Dose Route Frequency Provider Last Rate Last Dose  . acetaminophen (TYLENOL) tablet 650 mg  650 mg Oral Q6H PRN Benjamine Mola, FNP      . calcium-vitamin D (OSCAL WITH D) 500-200 MG-UNIT per tablet 1 tablet  1 tablet Oral BID Encarnacion Slates, NP   1 tablet at 05/15/14 0815  . hydrOXYzine (ATARAX/VISTARIL) tablet 25 mg  25 mg Oral Q6H PRN Benjamine Mola, FNP   25 mg at 05/15/14 1207  . LORazepam (ATIVAN) tablet 1 mg  1 mg Oral Q6H PRN Nicholaus Bloom, MD   1 mg at 05/14/14 2128  . magnesium hydroxide (MILK OF MAGNESIA) suspension 30 mL  30 mL Oral Daily PRN Benjamine Mola, FNP      .  ondansetron (ZOFRAN) tablet 4 mg  4 mg Oral Q8H PRN Clarene Reamer, MD      . perphenazine (TRILAFON) tablet 4 mg  4 mg Oral QHS Nicholaus Bloom, MD   4 mg at 05/14/14 2128  . senna (SENOKOT) tablet 8.6 mg  1 tablet Oral Daily Encarnacion Slates, NP      . Vortioxetine HBr TABS 30 mg  30 mg Oral Daily Nicholaus Bloom, MD   30 mg at 05/15/14 0815  . zolpidem (AMBIEN) tablet 10 mg  10 mg Oral QHS PRN Nicholaus Bloom, MD   10 mg at 05/14/14 2128    Lab Results:  Results for orders placed during the hospital encounter of 05/12/14 (from the past 48  hour(s))  VITAMIN B12     Status: None   Collection Time    05/13/14  8:15 PM      Result Value Ref Range   Vitamin B-12 523  211 - 911 pg/mL   Comment: Performed at [facility]  TSH     Status: None   Collection Time    05/13/14  8:15 PM      Result Value Ref Range   TSH 2.180  0.350 - 4.500 uIU/mL   Comment: Performed at [facility]    Physical Findings: AIMS:  , ,  ,  ,    CIWA:    COWS:     Treatment Plan Summary: Daily contact with patient to assess and evaluate symptoms and progress in treatment Medication management  Plan:  Contribute to encouraged to participate in groups.  Discontinue Ambien and try trazodone 50 mg at bedtime .  Encouraged to take Vistaril more often to help anxiety symptoms.   CBT;mindfulness, stress management  Medical Decision Making Problem Points:  Established problem, worsening (2) and Review of psycho-social stressors (1) Data Points:  Review of medication regiment & side effects (2) Review of new medications or change in dosage (2)  I certify that inpatient services furnished can reasonably be expected to improve the patient's condition.   Toni Alvarado T. 05/15/2014, 12:36 PM

## 2014-05-15 NOTE — Progress Notes (Addendum)
Pt appears very sad today. She did go to the am group and stated her depression is a 4/10 and her anxiety is a 2/10. Pt would like to develop a plan for returning to work and participating in all activities. Pt wants to write her plan step by step. Pt would like to get started working on a plan.The first part of the plan is to have her daughter bring in her school books. She denies Si and HI and contracts for safety. 11am-pt is very pleasant and cooperative. 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 She feels only one child is succeeding in her class. Pt felt she could relax more by having her daughter bring some of her school books here. Pt stated her daughter refused to do that. Pt presently is talking with the doctor. 5pm-Pt went outdoors.

## 2014-05-15 NOTE — BHH Group Notes (Signed)
[place] Group Notes:  (Clinical Social Work)  05/15/2014     10-11AM  Summary of Progress/Problems:   The main focus of today's process group was to learn how to use a decisional balance exercise to move forward in the Stages of Change, which were described and discussed.  Motivational Interviewing and a worksheet were utilized to help patients explore in depth the perceived benefits and costs of a self-sabotaging behavior, as well as the  benefits and costs of replacing that with a healthy coping mechanism.   The patient expressed herself fluently and frequently throughout group, both to clinician and to other group members.  Another group member interrupted her frequently, and she showed much patience and restraint in talking with that person.  She was cognizant of how her choice of isolation as a coping mechanism is actually harming her anxiety and depression symptoms.    Type of Therapy:  Group Therapy - Process   Participation Level:  Active  Participation Quality:  Appropriate, Attentive, Sharing and Supportive  Affect:  Blunted  Cognitive:  Alert, Appropriate and Oriented  Insight:  Engaged  Engagement in Therapy:  Engaged  Modes of Intervention:  Education, Motivational Interviewing  Selmer Dominion, LCSW 05/15/2014, 12:53 PM

## 2014-05-15 NOTE — BHH Group Notes (Signed)
[facility] Group Notes:  Healthy Coping Skills  Date:  05/15/2014  Time:  2:40 PM  Type of Therapy:  Nurse Education  Participation Level:  Active  Participation Quality:  Appropriate  Affect:  Appropriate  Cognitive:  Alert  Insight:  Appropriate  Engagement in Group:  Engaged  Modes of Intervention:  Discussion  Summary of Progress/Problems:  Toni Alvarado 05/15/2014, 2:40 PM

## 2014-05-15 NOTE — Progress Notes (Signed)
D.  Pt pleasant on approach, unhappy with move to new room.  Initially she reported that roommate "smells".  Later in shift she did get along well with new roommate.  Denies SI/HI/hallucinations at this time.  Did not attend evening wrap up group.  A.  Support and encouragement offered  R.  Pt remains safe on unit, will continue to monitor.

## 2014-05-15 NOTE — BHH Group Notes (Signed)
[place] Group Notes:  Goals group  Date:  05/15/2014  Time:  9:46 AM  Type of Therapy:  Nurse Education  Participation Level:  Active  Participation Quality:  Appropriate  Affect:  Appropriate  Cognitive:  Alert  Insight:  Appropriate  Engagement in Group:  Engaged  Modes of Intervention:  Discussion  Summary of Progress/Problems:Pt stated her goal is to finish her packet and stay out of her room and remain busy  Marcello Moores [facility] [address] 05/15/2014, 9:46 AM

## 2014-05-15 NOTE — Progress Notes (Signed)
D.  Pt pleasant on approach, complaint of continued insomnia and anxiety.  Denies SI/HI/hallucinations at this time.  Interacting appropriately with peers on the unit.  Pt did not attend evening wrap up group.  A.  Support and encouragement offered, medication given as ordered for anxiety and insomnia.  R.  Pt remains safe on unit, will continue to monitor.

## 2014-05-16 NOTE — Progress Notes (Signed)
Patient ID: Toni Alvarado, female   DOB: 03/11/1966, 48 y.o.   MRN: [id number] D: Patient is pleasant on approach; she interacts well with staff and peers.  She is attending groups and participating in her treatment.  She continues to have some depressive symptoms, tearfulness and anxiety.  She rates her depression as a 6; hopelessness as a 2.  She complains of "feeling hungover from the meds."  Her goal is to "try and stay awake, go to groups, read and journal.  She indicated no SI on inventory sheet, however, verbal expressed to MD that she continues to have some passive SI.  She is sleeping and eating well.  She is planning on being discharged soon.   A: Continue to monitor medication management and MD orders.  Safety checks completed every 15 minutes per protocol.  Encourage and support patient. R: Patient is cooperative; her behavior is appropriate.

## 2014-05-16 NOTE — Progress Notes (Signed)
Psychoeducational Group Note  Date: 05/16/2014 Time: 1015  Group Topic/Focus:  Being Greatful: This group is focused on helping patients identify 3 pillars of strength in their lives as well as identify what  They have learned in their lives thus far.   Participation Level:  Active  Participation Quality:  Attentive  Affect:  Appropriate  Cognitive:  Appropriate  Insight:  Engaged  Engagement in Group:  Engaged  Additional Comments:    05/16/2014,10:45 AM Rob Hickman Trixie Rude

## 2014-05-16 NOTE — Progress Notes (Signed)
Attended group 

## 2014-05-16 NOTE — BHH Group Notes (Signed)
[facility] Group Notes:  (Clinical Social Work)  05/16/2014  10:00-11:00AM  Summary of Progress/Problems:   The main focus of today's process group was to   1)  discuss the importance of adding supports  2)  define health supports versus unhealthy supports  3)  identify the patient's current unhealthy supports and plan how to handle them  4)  Identify the patient's current healthy supports and plan what to add.  An emphasis was placed on using counselor, doctor, therapy groups, 12-step groups, and problem-specific support groups to expand supports.    The patient expressed full comprehension of the concepts presented, and agreed that there is a need to add more supports.  The patient stated she wants to add a therapist at [facility] when she leaves the hospital.  She participated heavily in the discussion.  Type of Therapy:  Process Group with Motivational Interviewing  Participation Level:  Active  Participation Quality:  Appropriate, Attentive, Sharing and Supportive  Affect:  Blunted and Depressed  Cognitive:  Alert, Appropriate and Oriented  Insight:  Engaged  Engagement in Therapy:  Engaged  Modes of Intervention:   Education, Support and Processing, Activity  [employer], LCSW 05/16/2014, 12:15pm

## 2014-05-16 NOTE — Progress Notes (Signed)
[facility] MD Progress Note  05/16/2014 8:41 AM Toni Alvarado  MRN:  [id number]  Subjective:   I am sleeping better with trazodone but I feel groggy in the morning.      Objective: Patient seen chart reviewed.  She remains anxious feeling overwhelmed however she felt that her sleep last night.  She continues to have ruminative thoughts about her illness and recovery.  She gets very emotional and labile and overwhelmed when she talked about her anxiety depression and stressors .  She endorsed that her current relationship is almost ended however she still attached in her relationship.  She is separated from her husband and she feel that she never had any grief counseling upon separation.  Her current relationship is also not going really well.  She admitted crying and tearfulness and still has passive suicidal thoughts however no plan.  The patient is hoping to see a therapist upon discharge.  She wants to continue her current medication.  She denies any side effects other than groggy feeling from trazodone.  She is going to the groups.  She is hoping that her daughter bring educational assignment so she can try to accomplish what she is in the hospital.  Patient denies any tremors, shakes.  Diagnosis:   DSM5: Deressive Disorders:  Major Depressive Disorder - Severe (296.23) Total Time spent with patient: 30 minutes  Axis I: Generalized Anxiety Disorder  ADL's:  Intact  Sleep: Fair  Appetite:  Fair  Psychiatric Specialty Exam: Physical Exam  Review of Systems  Constitutional: Negative.   HENT: Negative.   Eyes: Negative.   Respiratory: Negative.   Cardiovascular: Negative.   Gastrointestinal: Negative.   Genitourinary: Negative.   Musculoskeletal: Negative.   Skin: Negative.   Neurological: Negative.   Endo/Heme/Allergies: Negative.   Psychiatric/Behavioral: Positive for depression and suicidal ideas. The patient is nervous/anxious.     Blood pressure 105/73, pulse 89, temperature 98.5  F (36.9 C), temperature source Oral, resp. rate 16, height 5\' 3"  (1.6 m), weight 183 lb (83.008 kg), last menstrual period 05/09/2014, SpO2 100.00%.Body mass index is 32.43 kg/(m^2).  General Appearance: Casual  Eye Contact::  Fair  Speech:  Clear and Coherent  Volume:  fluctuates  Mood:  Anxious, Depressed, Hopeless and Worthless  Affect:  Constricted and Depressed  Thought Process:  Coherent and Goal Directed  Orientation:  Full (Time, Place, and Person)  Thought Content:  events symptoms worries concerns  Suicidal Thoughts:  No  Homicidal Thoughts:  No  Memory:  Immediate;   Fair Recent;   Fair Remote;   Fair  Judgement:  Fair  Insight:  Present  Psychomotor Activity:  Restlessness  Concentration:  Fair  Recall:  [employer] of Knowledge:NA  Language: Fair  Akathisia:  No  Handed:    AIMS (if indicated):     Assets:  Desire for Improvement Housing Social Support Vocational/Educational  Sleep:  Number of Hours: 6.5   Musculoskeletal: Strength & Muscle Tone: within normal limits Gait & Station: normal Patient leans: N/A  Current Medications: Current Facility-Administered Medications  Medication Dose Route Frequency Provider Last Rate Last Dose  . acetaminophen (TYLENOL) tablet 650 mg  650 mg Oral Q6H PRN Benjamine Mola, FNP      . alum & mag hydroxide-simeth (MAALOX/MYLANTA) 200-200-20 MG/5ML suspension 15 mL  15 mL Oral Q6H PRN Kathlee Nations, MD      . calcium-vitamin D (OSCAL WITH D) 500-200 MG-UNIT per tablet 1 tablet  1 tablet Oral BID Herbert Pun  I Nwoko, NP   1 tablet at 05/16/14 0800  . hydrOXYzine (ATARAX/VISTARIL) tablet 25 mg  25 mg Oral Q6H PRN Benjamine Mola, FNP   25 mg at 05/15/14 2135  . LORazepam (ATIVAN) tablet 1 mg  1 mg Oral Q6H PRN Nicholaus Bloom, MD   1 mg at 05/15/14 2135  . magnesium hydroxide (MILK OF MAGNESIA) suspension 30 mL  30 mL Oral Daily PRN Benjamine Mola, FNP      . ondansetron [facility]) tablet 4 mg  4 mg Oral Q8H PRN Clarene Reamer, MD       . perphenazine (TRILAFON) tablet 4 mg  4 mg Oral QHS Nicholaus Bloom, MD   4 mg at 05/15/14 2135  . senna (SENOKOT) tablet 8.6 mg  1 tablet Oral Daily Encarnacion Slates, NP   8.6 mg at 05/16/14 0800  . traZODone (DESYREL) tablet 50 mg  50 mg Oral QHS Kathlee Nations, MD   50 mg at 05/15/14 2135  . Vortioxetine HBr TABS 30 mg  30 mg Oral Daily Nicholaus Bloom, MD   30 mg at 05/16/14 0800    Lab Results:  No results found for this or any previous visit (from the past 57 hour(s)).  Physical Findings: AIMS:  , ,  ,  ,    CIWA:    COWS:     Treatment Plan Summary: Daily contact with patient to assess and evaluate symptoms and progress in treatment Medication management  Plan:  Contribute to encouraged to participate in groups.  Continue trazodone 50 mg at bedtime .  Continue Vistaril to help anxiety .  Patient is taking Trilafon and Vortioxetine.   Medical Decision Making Problem Points:  Established problem, worsening (2) and Review of psycho-social stressors (1) Data Points:  Review and summation of old records (2) Review of medication regiment & side effects (2)  I certify that inpatient services furnished can reasonably be expected to improve the patient's condition.   ARFEEN,SYED T. 05/16/2014, 8:41 AM

## 2014-05-16 NOTE — Progress Notes (Signed)
Psychoeducational Group Note  Date:  05/16/2014 Time:  1315  Group Topic/Focus:  Making Healthy Choices:   The focus of this group is to help patients identify negative/unhealthy choices they were using prior to admission and identify positive/healthier coping strategies to replace them upon discharge.  Participation Level:  Did not attend  Toni Alvarado 05/16/2014

## 2014-05-17 LAB — VITAMIN D 1,25 DIHYDROXY
Vitamin D 1, 25 (OH)2 Total: 63 pg/mL (ref 18–72)
Vitamin D3 1, 25 (OH)2: 63 pg/mL

## 2014-05-17 MED ORDER — CALCIUM CARBONATE-VITAMIN D 500-200 MG-UNIT PO TABS: 1.0000 | ORAL_TABLET | Freq: Two times a day (BID) | ORAL | Status: DC

## 2014-05-17 MED ORDER — LORAZEPAM 1 MG PO TABS
1.0000 mg | ORAL_TABLET | Freq: Four times a day (QID) | ORAL | Status: DC | PRN
Start: 2014-05-17 — End: 2018-12-19

## 2014-05-17 MED ORDER — PERPHENAZINE 4 MG PO TABS: 4.0000 mg | ORAL_TABLET | Freq: Every day | ORAL | Status: DC

## 2014-05-17 MED ORDER — HYDROXYZINE HCL 25 MG PO TABS: 25.0000 mg | ORAL_TABLET | Freq: Four times a day (QID) | ORAL | Status: DC | PRN

## 2014-05-17 MED ORDER — VORTIOXETINE HBR 10 MG PO TABS: 30.0000 mg | ORAL_TABLET | Freq: Every day | ORAL | Status: DC

## 2014-05-17 MED ORDER — TRAZODONE HCL 50 MG PO TABS: 50.0000 mg | ORAL_TABLET | Freq: Every day | ORAL | Status: DC

## 2014-05-17 NOTE — BHH Group Notes (Signed)
[facility] LCSW Group Therapy          Overcoming Obstacles       1:15 -2:30        05/17/2014       Type of Therapy:  Group Therapy  Participation Level:  Appropriate  Participation Quality:  Appropriate  Affect:  Appropriate, Alert  Cognitive:  Attentive Appropriate  Insight: Developing/Improving Engaged  Engagement in Therapy: Developing/Imprvoing Engaged  Modes of Intervention:  Discussion Exploration  Education Rapport BuildingProblem-Solving Support  Summary of Progress/Problems:  The main focus of today's group was overcoming obstacles. She advised being home alone tomorrow is a obstacle for her.  Patient shared she has a lot of task she can work on before starting [place] on Wednesday.       Patient able to identify appropriate coping skills.   Concha Pyo 05/17/2014

## 2014-05-17 NOTE — BHH Suicide Risk Assessment (Signed)
Suicide Risk Assessment  Discharge Assessment     Demographic Factors:  Caucasian  Total Time spent with patient: 30 minutes  Psychiatric Specialty Exam:     Blood pressure 108/73, pulse 114, temperature 98 F (36.7 C), temperature source Oral, resp. rate 18, height 5\' 3"  (1.6 m), weight 83.008 kg (183 lb), last menstrual period 05/09/2014, SpO2 100.00%.Body mass index is 32.43 kg/(m^2).  General Appearance: Fairly Groomed  Engineer, water::  Fair  Speech:  Clear and Coherent  Volume:  Normal  Mood:  sad, worried  Affect:  Appropriate  Thought Process:  Coherent and Goal Directed  Orientation:  Full (Time, Place, and Person)  Thought Content:  plans as she moves on  Suicidal Thoughts:  No  Homicidal Thoughts:  No  Memory:  Immediate;   Fair Recent;   Fair Remote;   Fair  Judgement:  Fair  Insight:  Present  Psychomotor Activity:  Normal  Concentration:  Fair  Recall:  [employer] of Knowledge:NA  Language: Fair  Akathisia:  No  Handed:    AIMS (if indicated):     Assets:  Desire for [place] Talents/Skills Vocational/Educational  Sleep:  Number of Hours: 4.5    Musculoskeletal: Strength & Muscle Tone: within normal limits Gait & Station: normal Patient leans: N/A   Mental Status Per Nursing Assessment::   On Admission:     Current Mental Status by Physician: In full contact with reality. There are no active SI plans or intent. She has tolerated the medications well. She has worked on her Radiographer, therapeutic. She feels ready to be going back home. Understands that she needs to continue to work on things but states she feels she can do it from an outpatient setting.    Loss Factors: Loss of significant relationship  Historical Factors: NA  Risk Reduction Factors:   Sense of responsibility to family, Employed, Living with another person, especially a relative and Positive social support  Continued Clinical Symptoms:  Depression:    Severe  Cognitive Features That Contribute To Risk: None identified   Suicide Risk:  Minimal: No identifiable suicidal ideation.  Patients presenting with no risk factors but with morbid ruminations; may be classified as minimal risk based on the severity of the depressive symptoms  Discharge Diagnoses:   AXIS I:  Major Depression recurrent severe, GAD AXIS II:  No diagnosis AXIS III:   Past Medical History  Diagnosis Date  . Anemia   . Depression   . B12 deficiency   . Anxiety    AXIS IV:  other psychosocial or environmental problems AXIS V:  61-70 mild symptoms  Plan Of Care/Follow-up recommendations:  Activity:  as tolerated Diet:  regular Follow up Dr. Clovis Pu, [facility] Is patient on multiple antipsychotic therapies at discharge:  No   Has Patient had three or more failed trials of antipsychotic monotherapy by history:  No  Recommended Plan for Multiple Antipsychotic Therapies: NA    Brayten Komar A 05/17/2014, 1:36 PM

## 2014-05-17 NOTE — Discharge Summary (Signed)
Physician Discharge Summary Note  Patient:  Toni Alvarado is an 49 y.o., female MRN:  [id number] DOB:  [birth year] Patient phone:  [phone] (home)  Patient address:   [place] [address],  Total Time spent with patient: Greater than 30 minutes  Date of Admission:  05/12/2014 Date of Discharge: 05/17/14  Reason for Admission: Mood stabilization treatments  Discharge Diagnoses: Principal Problem:   Major depressive disorder, recurrent episode, severe, without mention of psychotic behavior Active Problems:   Generalized anxiety disorder   Suicidal ideation   Psychiatric Specialty Exam: Physical Exam  Psychiatric: Her speech is normal and behavior is normal. Judgment and thought content normal. Her mood appears not anxious. Her affect is not angry, not blunt, not labile and not inappropriate. Cognition and memory are normal. She does not exhibit a depressed mood.    Review of Systems  Constitutional: Negative.   HENT: Negative.   Eyes: Negative.   Respiratory: Negative.   Cardiovascular: Negative.   Gastrointestinal: Negative.   Genitourinary: Negative.   Musculoskeletal: Negative.   Skin: Negative.   Neurological: Negative.   Endo/Heme/Allergies: Negative.   Psychiatric/Behavioral: Positive for depression (Stable). Negative for suicidal ideas, hallucinations, memory loss and substance abuse. The patient has insomnia (Stable). The patient is not nervous/anxious.     Blood pressure 108/73, pulse 114, temperature 98 F (36.7 C), temperature source Oral, resp. rate 18, height $RemoveBe'5\' 3"'pMmwghfbU$  (1.6 m), weight 83.008 kg (183 lb), last menstrual period 05/09/2014, SpO2 100.00%.Body mass index is 32.43 kg/(m^2).   General Appearance: Fairly Groomed   Engineer, water:: Fair   Speech: Clear and Coherent   Volume: Normal   Mood: sad, worried   Affect: Appropriate   Thought Process: Coherent and Goal Directed   Orientation: Full (Time, Place, and Person)   Thought  Content: plans as she moves on   Suicidal Thoughts: No   Homicidal Thoughts: No   Memory: Immediate; Fair  Recent; Fair  Remote; Fair   Judgement: Fair   Insight: Present   Psychomotor Activity: Normal   Concentration: Fair   Recall: [employer] of Knowledge:NA   Language: Fair   Akathisia: No   Handed:   AIMS (if indicated):   Assets: Desire for Improvement  Housing  Social Support  Talents/Skills  Vocational/Educational   Sleep: Number of Hours: 4.5    Past Psychiatric History: Diagnosis: Major depressive disorder, recurrent episode, severe, without mention of psychotic behavior, Generalized anxiety disorder  Hospitalizations: [facility] adult unit  Outpatient Care: [facility] with Dr. Curt Jews  Substance Abuse Care: NA  Self-Mutilation: NA  Suicidal Attempts: Yes, by overdose, hx of  Violent Behaviors: NA   Musculoskeletal: Strength & Muscle Tone: within normal limits Gait & Station: normal Patient leans: N/A  DSM5: Schizophrenia Disorders:  NA Obsessive-Compulsive Disorders:  NA Trauma-Stressor Disorders:  NA Substance/Addictive Disorders:  NA Depressive Disorders:  Major depressive disorder, recurrent episode, severe, without mention of psychotic behavior  Axis Diagnosis:  AXIS I:  Major depressive disorder, recurrent episode, severe, without mention of psychotic behavior, Generalized anxiety disorder AXIS II:  Deferred AXIS III:   Past Medical History  Diagnosis Date  . Anemia   . Depression   . B12 deficiency   . Anxiety    AXIS IV:  other psychosocial or environmental problems and mental illness, chronic AXIS V:  64  Level of Care:  OP  Hospital Course:  Toni Alvarado is a 48 year old Caucasian female. Admitted to [facility]  from the [facility]. She reports, "My family brought me to the ED because I had a lot of anxiety and panic attacks. Things keep building up creating a lot of stress for me. I have been having panic attacks almost on daily  basis. I was on my way to work last week when it happened to me again. I became very panicky, I had to stop and call 911, the ambulance came and took me to the hospital then. I'm also depressed, I recently have been having suicidal thoughts. I was thinking about taking an overdose. I did attempt suicide in 1991, when I overdosed on medications. I did pass out and was taken to the ED for evaluation.  After admission assessment/evaluation, it was determined based on patient's symptotoms that Toni Alvarado will need medication management to re-stabilize her current depressive mood symptoms. She was ordered, medicated and discharged on Perphenazine 4 mg q bedtime for mood control, Vortoxetine  10 mg daily for depression, Lorazepam 1.0 mg Q 4 hours for anxiety, Hydroxyzine 25 mg four times daily as needed for anxiety and Trazodone 50 mg Q bedtime for sleep. She was enrolled in the group counseling sessions and activities where she was counseled and learned coping skills that should help her cope better and manage her symptoms effectively after discharge. She also received medication management and monitoring for her other previously existing medical issues. She tolerated her treatment regimen without any significant adverse effects and or reactions presented.   Patient did respond positively to her treatment regimen. This is evidenced by her reports of improved mood, reduction of symptoms and presentation of good affect/eye contact. She attended treatment team meeting this am and met with her treatment team members. Her reason for admission, response to treatment and discharge plans discussed. Toni Alvarado endorsed that her symptoms has improved and that she is ready for discharge to pursue psychiatric care on outpatient basis. It was then agreed upon that patient will follow-up care at the [facility] here in [place], [place] and [facility]. She is provided with all the pertinent  information required to make these appointments without problems.  Upon discharge, Toni Alvarado adamantly denies any suicidal, homicidal ideations, auditory, visual hallucinations, paranoia and or delusional thoughts. She was provided with a 4 days worth supply samples of her [facility] discharge medications. She left [facility] with all personal belongings in no apparent distress. Transportation per patient's arrangement.  Consults:  psychiatry  Significant Diagnostic Studies:  labs: CBC with diff, CMP, UDS, toxicology tests, U/A, results reviewed, stable  Discharge Vitals:   Blood pressure 108/73, pulse 114, temperature 98 F (36.7 C), temperature source Oral, resp. rate 18, height $RemoveBe'5\' 3"'VTHCvbMCj$  (1.6 m), weight 83.008 kg (183 lb), last menstrual period 05/09/2014, SpO2 100.00%. Body mass index is 32.43 kg/(m^2). Lab Results:   No results found for this or any previous visit (from the past 72 hour(s)).  Physical Findings: AIMS:  , ,  ,  ,    CIWA:    COWS:     Psychiatric Specialty Exam: See Psychiatric Specialty Exam and Suicide Risk Assessment completed by Attending Physician prior to discharge.  Discharge destination:  Home  Is patient on multiple antipsychotic therapies at discharge:  No   Has Patient had three or more failed trials of antipsychotic monotherapy by history:  No  Recommended Plan for Multiple Antipsychotic Therapies: NA    Medication List       Indication   calcium-vitamin D 500-200 MG-UNIT per  tablet  Commonly known as:  OSCAL WITH D  Take 1 tablet by mouth 2 (two) times daily. For bone health   Indication:  Low Amount of Calcium in the Blood     hydrOXYzine 25 MG tablet  Commonly known as:  ATARAX/VISTARIL  Take 1 tablet (25 mg total) by mouth every 6 (six) hours as needed for anxiety.   Indication:  Tension, Anxiety     LORazepam 1 MG tablet  Commonly known as:  ATIVAN  Take 1 tablet (1 mg total) by mouth every 6 (six) hours as needed for anxiety.   Indication:  Feeling  Anxious     perphenazine 4 MG tablet  Commonly known as:  TRILAFON  Take 1 tablet (4 mg total) by mouth at bedtime. Mood control   Indication:  Mood control     traZODone 50 MG tablet  Commonly known as:  DESYREL  Take 1 tablet (50 mg total) by mouth at bedtime. For sleep   Indication:  Trouble Sleeping     Vortioxetine HBr 10 MG Tabs  Commonly known as:  BRINTELLIX  Take 3 tablets (30 mg total) by mouth daily. For depression   Indication:  Major Depressive Disorder       Follow-up Information   Follow up with Dr. Clovis Pu - [facility] On 06/10/2014. (Thursday, June 10, 2014 at 4:45.  You will be put on a cancellation list for an earlier appointment should one come availab.e)    Contact information:   [place], [place]  [phone]      [facility] with [facility] On 05/19/2014. (You are scheduled to start [facility] on Wednesday, May 19, 2014 at 8:45)    Contact information:   [address] [place],    [address]  [phone]     Follow-up recommendations: Activity:  As tolerated Diet: As recommended by your primary care doctor. Keep all scheduled follow-up appointments as recommended.    Comments:  Take all your medications as prescribed by your mental healthcare provider. Report any adverse effects and or reactions from your medicines to your outpatient provider promptly. Patient is instructed and cautioned to not engage in alcohol and or illegal drug use while on prescription medicines. In the event of worsening symptoms, patient is instructed to call the crisis hotline, 911 and or go to the nearest ED for appropriate evaluation and treatment of symptoms. Follow-up with your primary care provider for your other medical issues, concerns and or health care needs.   Total Discharge Time:  Greater than 30 minutes.  Signed: Encarnacion Slates,  PMHNP-BC 05/17/2014, 11:41 AM I personally assessed the patient and formulated the plan Geralyn Flash A. Sabra Heck, M.D.

## 2014-05-17 NOTE — Progress Notes (Signed)
Assumed care of patient at 2300. Pt up and complaining of difficulty sleeping due to roommate's loud snoring. Ativan prn given with relief at 2423. Pt later observed resting without difficulty. No acute distress. No further complaints. Level III obs in place and pt has remained safe. Jamie Kato

## 2014-05-17 NOTE — Progress Notes (Signed)
Patient ID: Toni Alvarado, female   DOB: 12-14-1965, 48 y.o.   MRN: [id number]  Pt. Denies SI/HI and A/V hallucinations. Patient rates her depression 5/10 today and anxiety 3/10 for the day. Patient reports sleeping well last night. Belongings returned to patient at time of discharge. Patient denies any pain or discomfort. Discharge instructions and medications were reviewed with patient. Patient verbalized understanding of both medications and discharge instructions. No distress noted on discharge. Q15 minute safety checks maintained until discharge.

## 2014-05-17 NOTE — Progress Notes (Signed)
[facility] Adult Case Management Discharge Plan :  Will you be returning to the same living situation after discharge: Yes,  Patient is returning to her home. At discharge, do you have transportation home?:Yes,  Patient to arrange transportation home. Do you have the ability to pay for your medications:Yes,  Patient is able to obtain medications.  Release of information consent forms completed and in the chart;  Patient's signature needed at discharge.  Patient to Follow up at: Follow-up Information   Follow up with Dr. Clovis Pu - [facility] On 06/10/2014. (Thursday, June 10, 2014 at 4:45.  You will be put on a cancellation list for an earlier appointment should one come availab.e)    Contact information:   [place], [place]  [phone]      Follow up with [facility] On 05/19/2014. (You are scheduled to start MH-IOP on Wednesday, May 19, 2014 at 8:45)    Contact information:   [address] [place], [place]   [address]  [phone]      Patient denies SI/HI:  Patient no longer endorsing SI/HI or other thoughts of self harm.   Safety Planning and Suicide Prevention discussed: .Reviewed with all patients during discharge planning group   Therman Hughlett, Eulas Post 05/17/2014, 10:37 AM

## 2014-05-17 NOTE — BHH Group Notes (Signed)
[facility] LCSW Aftercare Discharge Planning Group Note   05/17/2014 8:26 AM    Participation Quality:  Appropraite  Mood/Affect:  Appropriate  Depression Rating:  3  Anxiety Rating:  3  Thoughts of Suicide:  No  Will you contract for safety?   NA  Current AVH:  No  Plan for Discharge/Comments:  Patient attended discharge planning group and actively participated in group.  Patient will follow up with [facility].  CSW provided all participants with daily workbook.   Transportation Means: Patient has transportation.   Supports:  Patient has a support system.   Denver Bentson, Eulas Post

## 2014-05-19 ENCOUNTER — Other Ambulatory Visit (HOSPITAL_COMMUNITY): Payer: BC Managed Care – PPO | Attending: Psychiatry

## 2014-05-19 DIAGNOSIS — Z87891 Personal history of nicotine dependence: Secondary | ICD-10-CM | POA: Insufficient documentation

## 2014-05-19 DIAGNOSIS — D649 Anemia, unspecified: Secondary | ICD-10-CM | POA: Insufficient documentation

## 2014-05-19 DIAGNOSIS — IMO0001 Reserved for inherently not codable concepts without codable children: Secondary | ICD-10-CM | POA: Insufficient documentation

## 2014-05-19 DIAGNOSIS — G4700 Insomnia, unspecified: Secondary | ICD-10-CM | POA: Insufficient documentation

## 2014-05-19 DIAGNOSIS — F410 Panic disorder [episodic paroxysmal anxiety] without agoraphobia: Secondary | ICD-10-CM | POA: Insufficient documentation

## 2014-05-19 DIAGNOSIS — Z609 Problem related to social environment, unspecified: Secondary | ICD-10-CM | POA: Insufficient documentation

## 2014-05-19 DIAGNOSIS — F4310 Post-traumatic stress disorder, unspecified: Secondary | ICD-10-CM | POA: Insufficient documentation

## 2014-05-19 DIAGNOSIS — F339 Major depressive disorder, recurrent, unspecified: Secondary | ICD-10-CM | POA: Insufficient documentation

## 2014-05-19 DIAGNOSIS — F411 Generalized anxiety disorder: Secondary | ICD-10-CM | POA: Insufficient documentation

## 2014-05-19 NOTE — Progress Notes (Addendum)
Patient Discharge Instructions:  After Visit Summary (AVS):   Faxed to:  05/19/14 Discharge Summary Note:   Faxed to:  05/19/14 Psychiatric Admission Assessment Note:   Faxed to:  05/19/14 Suicide Risk Assessment - Discharge Assessment:   Faxed to:  05/19/14 Faxed/Sent to the Next Level Care provider:  05/19/14 Next Level Care Provider Has Access to the EMR, 05/19/14 Faxed to [facility] @ [phone] Records provided to [facility] via CHL/Epic access.  Patsey Berthold, 05/19/2014, 3:54 PM
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
## 2014-05-20 ENCOUNTER — Other Ambulatory Visit (HOSPITAL_COMMUNITY): Payer: BC Managed Care – PPO | Admitting: Psychiatry

## 2014-05-20 DIAGNOSIS — F411 Generalized anxiety disorder: Secondary | ICD-10-CM

## 2014-05-20 DIAGNOSIS — D649 Anemia, unspecified: Secondary | ICD-10-CM | POA: Diagnosis not present

## 2014-05-20 DIAGNOSIS — F339 Major depressive disorder, recurrent, unspecified: Secondary | ICD-10-CM | POA: Diagnosis present

## 2014-05-20 DIAGNOSIS — F410 Panic disorder [episodic paroxysmal anxiety] without agoraphobia: Secondary | ICD-10-CM | POA: Diagnosis not present

## 2014-05-20 DIAGNOSIS — IMO0001 Reserved for inherently not codable concepts without codable children: Secondary | ICD-10-CM | POA: Diagnosis not present

## 2014-05-20 DIAGNOSIS — F4310 Post-traumatic stress disorder, unspecified: Secondary | ICD-10-CM | POA: Diagnosis not present

## 2014-05-20 DIAGNOSIS — Z87891 Personal history of nicotine dependence: Secondary | ICD-10-CM | POA: Diagnosis not present

## 2014-05-20 DIAGNOSIS — F332 Major depressive disorder, recurrent severe without psychotic features: Secondary | ICD-10-CM

## 2014-05-20 DIAGNOSIS — G4700 Insomnia, unspecified: Secondary | ICD-10-CM | POA: Diagnosis not present

## 2014-05-20 DIAGNOSIS — Z609 Problem related to social environment, unspecified: Secondary | ICD-10-CM | POA: Diagnosis not present
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
## 2014-05-20 NOTE — Progress Notes (Signed)
    Daily Group Progress Note  Program: IOP  Group Time: 9:00-10:30  Participation Level: Active  Behavioral Response: Appropriate  Type of Therapy:  Group Therapy  Summary of Progress: Pt.'s first day in group. Pt. Reported that she was doing "alright". Pt. Shared work related stress and history of poor boundaries and trauma in relationship with her mother.      Group Time: 10:30-12:00  Participation Level:  Active  Behavioral Response: Appropriate  Type of Therapy: Psycho-education Group  Summary of Progress: Pt. Participated in reflective reading and guided visualization "Putting the backpack down".  Nancie Neas, COUNS

## 2014-05-21 ENCOUNTER — Other Ambulatory Visit (HOSPITAL_COMMUNITY): Payer: BC Managed Care – PPO | Admitting: Psychiatry

## 2014-05-21 DIAGNOSIS — F332 Major depressive disorder, recurrent severe without psychotic features: Secondary | ICD-10-CM

## 2014-05-21 DIAGNOSIS — F339 Major depressive disorder, recurrent, unspecified: Secondary | ICD-10-CM | POA: Diagnosis not present

## 2014-05-21 NOTE — Progress Notes (Signed)
    Daily Group Progress Note  Program: IOP  Group Time: 9:00-10:30  Participation Level: Active  Behavioral Response: Appropriate  Type of Therapy:  Group Therapy  Summary of Progress: Pt. Reported that she did not sleep well last night and attributed to not taking her sleep meds. Pt. Reported that "her mind felt all over the place" and she was feeling anger about her past.      Group Time: 10:30-12:00  Participation Level:  Active  Behavioral Response: Appropriate  Type of Therapy: Psycho-education Group  Summary of Progress: Pt. Participated in discussion about developing self-compassion.   Nancie Neas, COUNS

## 2014-05-24 ENCOUNTER — Other Ambulatory Visit (HOSPITAL_COMMUNITY): Payer: BC Managed Care – PPO | Admitting: Psychiatry

## 2014-05-24 DIAGNOSIS — F339 Major depressive disorder, recurrent, unspecified: Secondary | ICD-10-CM | POA: Diagnosis not present

## 2014-05-24 DIAGNOSIS — F332 Major depressive disorder, recurrent severe without psychotic features: Secondary | ICD-10-CM

## 2014-05-24 NOTE — Progress Notes (Signed)
Patient ID: Toni Alvarado, female   DOB: 01-14-1966, 48 y.o.   MRN: [id number] Patient seen today states had been good Saturday but Sunday felt very depressed. Has drank caffeine late in the evening and has been experiencing difficulty falling asleep. Sleep hygiene was discussed as well as diet and exercise. Patient is angry with her church because no one from the church contacted her when she was hospitalized on behavioral health inpatient unit. Encourage patient to express her disappointment to the past her and she stated understanding. Denies suicidal or homicidal ideation has no hallucinations or delusions.

## 2014-05-24 NOTE — Progress Notes (Signed)
    Daily Group Progress Note  Program: IOP  Group Time: 9:00-10:30  Participation Level: Active  Behavioral Response: Appropriate  Type of Therapy:  Group Therapy  Summary of Progress: Pt. Reported feeling "engaged, entertained, and excited" on Saturday, but felt her depression returned on Sunday. Pt. Presented as tearful and reported feeling very tired due to daily burden of managing depression.      Group Time: 10:30-12:00  Participation Level:  Active  Behavioral Response: Appropriate  Type of Therapy: Psycho-education Group  Summary of Progress: Pt. Participated in grief and loss facilitated by Jeanella Craze.   Nancie Neas, COUNS

## 2014-05-24 NOTE — Progress Notes (Signed)
Patient ID: Toni Alvarado, female   DOB: 04-30-1966, 48 y.o.   MRN: [id number]

## 2014-05-25 ENCOUNTER — Other Ambulatory Visit (HOSPITAL_COMMUNITY): Payer: BC Managed Care – PPO | Admitting: Psychiatry

## 2014-05-25 DIAGNOSIS — F339 Major depressive disorder, recurrent, unspecified: Secondary | ICD-10-CM | POA: Diagnosis not present

## 2014-05-25 DIAGNOSIS — F332 Major depressive disorder, recurrent severe without psychotic features: Secondary | ICD-10-CM

## 2014-05-25 NOTE — Progress Notes (Signed)
    Daily Group Progress Note  Program: IOP  Group Time: 9:00-10:30  Participation Level: Active  Behavioral Response: Appropriate  Type of Therapy:  Group Therapy  Summary of Progress: Pt. Smiled and talked appropriately. Pt. Reported some moderate anxiety related to upcoming meeting at work to discuss plans for returning to work.      Group Time: 10:30-12:00  Participation Level:  Active  Behavioral Response: Appropriate  Type of Therapy: Psycho-education Group  Summary of Progress: Pt. Participated in discussion about developing self-compassion. Pt. Watched and discussed Marilynn Latino video.   Nancie Neas, COUNS

## 2014-05-26 ENCOUNTER — Other Ambulatory Visit (HOSPITAL_COMMUNITY): Payer: BC Managed Care – PPO | Admitting: Psychiatry

## 2014-05-26 DIAGNOSIS — F339 Major depressive disorder, recurrent, unspecified: Secondary | ICD-10-CM | POA: Diagnosis not present

## 2014-05-26 NOTE — Progress Notes (Signed)
    Daily Group Progress Note  Program: IOP  Group Time: 9:00-10:30  Participation Level: Active  Behavioral Response: Appropriate  Type of Therapy:  Group Therapy  Summary of Progress: Pt. Presented as anxious and tearful. Pt. 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      Group Time: 10:30-12:00  Participation Level:  Active  Behavioral Response: Appropriate  Type of Therapy: Group Therapy  Summary of Progress: Pt. Participated in discussion about developing vulnerability to counter shame. Pt. Watched and discussed Almond Lint video.  Nancie Neas, COUNS

## 2014-05-27 ENCOUNTER — Other Ambulatory Visit (HOSPITAL_COMMUNITY): Payer: BC Managed Care – PPO | Attending: Psychiatry | Admitting: Psychiatry

## 2014-05-27 DIAGNOSIS — F411 Generalized anxiety disorder: Secondary | ICD-10-CM | POA: Insufficient documentation

## 2014-05-27 DIAGNOSIS — F332 Major depressive disorder, recurrent severe without psychotic features: Secondary | ICD-10-CM | POA: Insufficient documentation

## 2014-05-27 DIAGNOSIS — F410 Panic disorder [episodic paroxysmal anxiety] without agoraphobia: Secondary | ICD-10-CM | POA: Diagnosis not present

## 2014-05-27 DIAGNOSIS — Z609 Problem related to social environment, unspecified: Secondary | ICD-10-CM | POA: Diagnosis not present

## 2014-05-27 DIAGNOSIS — F4310 Post-traumatic stress disorder, unspecified: Secondary | ICD-10-CM | POA: Insufficient documentation

## 2014-05-27 DIAGNOSIS — Z87891 Personal history of nicotine dependence: Secondary | ICD-10-CM | POA: Insufficient documentation

## 2014-05-27 DIAGNOSIS — Z599 Problem related to housing and economic circumstances, unspecified: Secondary | ICD-10-CM | POA: Diagnosis not present

## 2014-05-27 NOTE — Progress Notes (Signed)
    Daily Group Progress Note  Program: IOP  Group Time: 9:00-10:30  Participation Level: Active  Behavioral Response: Appropriate  Type of Therapy:  Group Therapy  Summary of Progress: Pt. Presented calm and talkative. Pt. Reported to the group that meeting with her principal went well and that she feels supportive. Pt. Reported that she does not feel entirely ready to go back to work, but that she is getting better.      Group Time: 10:30-12:00  Participation Level:  Active  Behavioral Response: Appropriate  Type of Therapy: Group Therapy  Summary of Progress: Pt. Participated in discussion about developing self-care behaviors.  Nancie Neas, COUNS

## 2014-05-28 ENCOUNTER — Other Ambulatory Visit (HOSPITAL_COMMUNITY): Payer: BC Managed Care – PPO | Admitting: Psychiatry

## 2014-05-28 DIAGNOSIS — F332 Major depressive disorder, recurrent severe without psychotic features: Secondary | ICD-10-CM | POA: Diagnosis not present

## 2014-05-28 DIAGNOSIS — F411 Generalized anxiety disorder: Secondary | ICD-10-CM
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
## 2014-05-31 ENCOUNTER — Other Ambulatory Visit (HOSPITAL_COMMUNITY): Payer: BC Managed Care – PPO | Admitting: Psychiatry

## 2014-05-31 DIAGNOSIS — F332 Major depressive disorder, recurrent severe without psychotic features: Secondary | ICD-10-CM | POA: Diagnosis not present

## 2014-06-01 ENCOUNTER — Other Ambulatory Visit (HOSPITAL_COMMUNITY): Payer: BC Managed Care – PPO | Admitting: Psychiatry

## 2014-06-01 DIAGNOSIS — F411 Generalized anxiety disorder: Secondary | ICD-10-CM

## 2014-06-01 DIAGNOSIS — F332 Major depressive disorder, recurrent severe without psychotic features: Secondary | ICD-10-CM | POA: Diagnosis not present

## 2014-06-01 NOTE — Progress Notes (Signed)
    Daily Group Progress Note  Program: IOP  Group Time: 9:00-10:30  Participation Level: Active  Behavioral Response: Appropriate  Type of Therapy:  Group Therapy  Summary of Progress: Pt. Reported that she was doing "ok", had some anxiety about going to her school to help with the transitioning. Pt. Discussed feeling inspired by her ideas about writing.      Group Time: 10:30-12:00  Participation Level:  Active  Behavioral Response: Appropriate  Type of Therapy: Psycho-education Group  Summary of Progress: Pt. Participated in grief and loss group.   Nancie Neas, COUNS

## 2014-06-02 ENCOUNTER — Other Ambulatory Visit (HOSPITAL_COMMUNITY): Payer: BC Managed Care – PPO | Admitting: Psychiatry

## 2014-06-02 DIAGNOSIS — F332 Major depressive disorder, recurrent severe without psychotic features: Secondary | ICD-10-CM | POA: Diagnosis not present

## 2014-06-02 DIAGNOSIS — F411 Generalized anxiety disorder: Secondary | ICD-10-CM

## 2014-06-02 NOTE — Progress Notes (Signed)
    Daily Group Progress Note  Program: IOP  Group Time: 9:00-10:30  Participation Level: Active  Behavioral Response: Appropriate  Type of Therapy:  Group Therapy  Summary of Progress: Pt. Continue to work on transitioning after end of marriage. Pt. Processing desire to end relationship and issues related to return to work.      Group Time: 10:30-12:00  Participation Level:  Active  Behavioral Response: Appropriate  Type of Therapy: Psycho-education Group  Summary of Progress: Pt. Participated in discussion about developing radical acceptance of self.   Nancie Neas, COUNS
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
## 2014-06-03 ENCOUNTER — Other Ambulatory Visit (HOSPITAL_COMMUNITY): Payer: BC Managed Care – PPO | Admitting: Psychiatry

## 2014-06-03 DIAGNOSIS — F332 Major depressive disorder, recurrent severe without psychotic features: Secondary | ICD-10-CM | POA: Diagnosis not present

## 2014-06-03 DIAGNOSIS — F411 Generalized anxiety disorder: Secondary | ICD-10-CM

## 2014-06-03 NOTE — Progress Notes (Signed)
    Daily Group Progress Note  Program: IOP  Group Time: 9:00-10:30  Participation Level: Active  Behavioral Response: Appropriate  Type of Therapy:  Group Therapy  Summary of Progress: Pt. Prepared for discharge tomorrow. Pt. Discussed family history of incest and estrangement from family members.      Group Time: 10:30-12:00  Participation Level:  Active  Behavioral Response: Appropriate  Type of Therapy: Psycho-education Group  Summary of Progress: Pt. Watched Ruby Wax video about the stigma of mental illness.   Nancie Neas, COUNS

## 2014-06-03 NOTE — Progress Notes (Signed)
    Daily Group Progress Note  Program: IOP  Group Time: 9:00-10:30  Participation Level: Active  Behavioral Response: Appropriate  Type of Therapy:  Group Therapy  Summary of Progress: Pt. Was talkative, smiled appropriately. Pt. Reports that she is getting ready to return to work, focusing on breathing and meditation exercises learned in group.      Group Time: 10:30-12:00  Participation Level:  Active  Behavioral Response: Appropriate  Type of Therapy: Psycho-education Group  Summary of Progress: Pt. Participated in discussion about developing radical acceptance of self.   Nancie Neas, COUNS

## 2014-06-03 NOTE — Patient Instructions (Addendum)
Patient will complete MH-IOP tomorrow (06-04-14).  Will follow up with Dr. Clovis Pu on 06-10-14 @ 4:45 pm and will schedule an appointment with a therapist at [facility].  Return to work on 06-07-14, without any restrictions.

## 2014-06-04 ENCOUNTER — Other Ambulatory Visit (HOSPITAL_COMMUNITY): Payer: BC Managed Care – PPO | Admitting: Psychiatry

## 2014-06-04 DIAGNOSIS — F411 Generalized anxiety disorder: Secondary | ICD-10-CM

## 2014-06-04 DIAGNOSIS — F332 Major depressive disorder, recurrent severe without psychotic features: Secondary | ICD-10-CM | POA: Diagnosis not present
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
## 2014-06-04 NOTE — Progress Notes (Signed)
    Daily Group Progress Note  Program: IOP  Group Time: 5465-0354  Participation Level: Active  Behavioral Response: Appropriate  Type of Therapy:  Group Therapy  Summary of Progress: Today was patient's last day.  States she has learned a lot from peers and group leader Eloise Levels, [facility]).  Processed negative talk about returning to work on Monday.     Group Time: 1045-1200  Participation Level:  Active  Behavioral Response: Appropriate  Type of Therapy: Psycho-education Group  Summary of Progress: Watch short video on Dr. Marilynn Latino presenting self-compassion.  She defined what it is and the three elements of it.  Discussed self compassion afterwards, before role playing three scenarios re:  Self-compassion.

## 2014-06-04 NOTE — Progress Notes (Signed)
  [facility] Intensive Outpatient Program Discharge Summary  Toni Alvarado [id number]  Admission date: 05/19/2014  Discharge date: 06/04/2014  Reason for admission: Patient was admitted from inpatient services.  Chemical Use History: She denies alcohol and drug use.    Progress in Program Toward Treatment Goals: Patient finished intensive outpatient program.  She is feeling much better.  She is less anxious and less depressed.  She is compliant with medication and reported no side effects.  She will see Dr. Clovis Pu for medication management.  Please see complete discharge summary that is done by Dr Gildardo Griffes.  There has no changes since then.  Progress (rationale): Patient improved from the past.  She achieved her goals.    [facility] 06/04/2014

## 2014-06-07 ENCOUNTER — Other Ambulatory Visit (HOSPITAL_COMMUNITY): Payer: BC Managed Care – PPO

## 2014-06-08 ENCOUNTER — Other Ambulatory Visit (HOSPITAL_COMMUNITY): Payer: BC Managed Care – PPO

## 2014-06-09 ENCOUNTER — Other Ambulatory Visit (HOSPITAL_COMMUNITY): Payer: BC Managed Care – PPO

## 2014-06-10 ENCOUNTER — Other Ambulatory Visit (HOSPITAL_COMMUNITY): Payer: BC Managed Care – PPO

## 2014-06-11 ENCOUNTER — Other Ambulatory Visit (HOSPITAL_COMMUNITY): Payer: BC Managed Care – PPO

## 2014-06-13 ENCOUNTER — Encounter (HOSPITAL_COMMUNITY): Payer: Self-pay | Admitting: Emergency Medicine

## 2014-06-13 ENCOUNTER — Emergency Department (HOSPITAL_COMMUNITY)
Admission: EM | Admit: 2014-06-13 | Discharge: 2014-06-14 | Disposition: A | Discharge status: Home / Self Care | Payer: BC Managed Care – PPO | Attending: Emergency Medicine | Admitting: Emergency Medicine

## 2014-06-13 DIAGNOSIS — Z862 Personal history of diseases of the blood and blood-forming organs and certain disorders involving the immune mechanism: Secondary | ICD-10-CM | POA: Insufficient documentation

## 2014-06-13 DIAGNOSIS — Z8639 Personal history of other endocrine, nutritional and metabolic disease: Secondary | ICD-10-CM | POA: Insufficient documentation

## 2014-06-13 DIAGNOSIS — R45851 Suicidal ideations: Secondary | ICD-10-CM | POA: Diagnosis present

## 2014-06-13 DIAGNOSIS — Z79899 Other long term (current) drug therapy: Secondary | ICD-10-CM | POA: Diagnosis not present

## 2014-06-13 NOTE — ED Notes (Signed)
Toni Alvarado, [name] in room to do an assessment.

## 2014-06-13 NOTE — ED Provider Notes (Signed)
CSN: [id number]     Arrival date & time 06/13/14  2304 History   First MD Initiated Contact with Patient 06/13/14 2325     Chief Complaint  Patient presents with  . Suicidal     (Consider location/radiation/quality/duration/timing/severity/associated sxs/prior Treatment) HPI  Toni Alvarado is a 48 y.o. female with past medical history significant for anemia, depression and anxiety brought in voluntarily for suicidal ideation with the plan. Patient states that she has been depressed and anxious and feeling that she is overwhelmed and can't cope. States that she has been text in her family that she loves them and goodbye, she has a plan to overdose on trazodone and Atarax. Patient states that she has been researching on line the basal dose, she states that she has 40 one trazodone at home which she's planning to OD on. She has prior suicide attempt of overdose in 1991. She denies homicidal ideation, auditory or visual hallucinations, drug or alcohol abuse, headache, chest pain, shortness of breath, nausea vomiting, change in bowel or bladder habits.   Past Medical History  Diagnosis Date  . Anemia   . Depression   . B12 deficiency   . Anxiety    Past Surgical History  Procedure Laterality Date  . Lithotripsy     Family History  Problem Relation Age of Onset  . Hyperlipidemia Mother   . Mental illness Mother   . Diabetes Father   . Heart disease Father   . Hyperlipidemia Father   . Stroke Father   . Mental illness Father   . Mental illness Brother   . Cancer Brother   . Breast cancer Sister    History  Substance Use Topics  . Smoking status: Never Smoker   . Smokeless tobacco: Never Used  . Alcohol Use: No   OB History   Grav Para Term Preterm Abortions TAB SAB Ect Mult Living                 Review of Systems  10 systems reviewed and found to be negative, except as noted in the HPI.   Allergies  Review of patient's allergies indicates no known allergies.  Home  Medications   Prior to Admission medications   Medication Sig Start Date End Date Taking? Authorizing Provider  calcium-vitamin D (OSCAL WITH D) 500-200 MG-UNIT per tablet Take 1 tablet by mouth 2 (two) times daily. For bone health 05/17/14  Yes Encarnacion Slates, NP  hydrOXYzine (ATARAX/VISTARIL) 25 MG tablet Take 1 tablet (25 mg total) by mouth every 6 (six) hours as needed for anxiety. 05/17/14  Yes Encarnacion Slates, NP  LORazepam (ATIVAN) 1 MG tablet Take 1 tablet (1 mg total) by mouth every 6 (six) hours as needed for anxiety. 05/17/14  Yes Encarnacion Slates, NP  perphenazine (TRILAFON) 4 MG tablet Take 1 tablet (4 mg total) by mouth at bedtime. Mood control 05/17/14  Yes Encarnacion Slates, NP  Vortioxetine HBr (BRINTELLIX) 10 MG TABS Take 30 mg by mouth daily.   Yes Historical Provider, MD  traZODone (DESYREL) 50 MG tablet Take 1 tablet (50 mg total) by mouth at bedtime. For sleep 05/17/14   Encarnacion Slates, NP   BP 115/53  Pulse 81  Temp(Src) 99 F (37.2 C) (Oral)  Resp 18  SpO2 99%  LMP 05/14/2014 Physical Exam  Nursing note and vitals reviewed. Constitutional: She is oriented to person, place, and time. She appears well-developed and well-nourished. No distress.  HENT:  Head: Normocephalic.  Eyes: Conjunctivae and EOM are normal.  Cardiovascular: Normal rate, regular rhythm and intact distal pulses.   Pulmonary/Chest: Effort normal and breath sounds normal. No stridor.  Abdominal: Soft. Bowel sounds are normal.  Musculoskeletal: Normal range of motion.  Neurological: She is alert and oriented to person, place, and time.  Psychiatric: Her affect is blunt. Her affect is not angry. Her speech is delayed. She is slowed. She exhibits a depressed mood. She expresses suicidal ideation. She expresses suicidal plans.    ED Course  Procedures (including critical care time) Labs Review Labs Reviewed  CBC - Abnormal; Notable for the following:    Hemoglobin 11.7 (*)    HCT 34.9 (*)    All other  components within normal limits  COMPREHENSIVE METABOLIC PANEL - Abnormal; Notable for the following:    Glucose, Bld 116 (*)    Total Bilirubin <0.2 (*)    All other components within normal limits  SALICYLATE LEVEL - Abnormal; Notable for the following:    Salicylate Lvl <8.2 (*)    All other components within normal limits  ACETAMINOPHEN LEVEL  ETHANOL  URINE RAPID DRUG SCREEN (HOSP PERFORMED)    Imaging Review No results found.   EKG Interpretation None      MDM   Final diagnoses:  Suicidal ideation    Filed Vitals:   06/13/14 2315  BP: 115/53  Pulse: 81  Temp: 99 F (37.2 C)  TempSrc: Oral  Resp: 18  SpO2: 99%    Toni Alvarado is a 48 y.o. female presenting voluntarily with suicidal ideation and specific plan to take 41 trazodone in addition to her Atarax. Patient has a history of depression, psychiatric hospitalizations and prior suicide attempts.  Patient is medically cleared for psychiatric evaluation will be transferred to the psych ED. TTS consulted, home meds and psych standard holding orders placed.    Monico Blitz, PA-C 06/14/14 [phone]

## 2014-06-13 NOTE — ED Notes (Signed)
Pt family states the patient has been expressing thoughts of suicide since Friday . Pt states she has thought about taking all of her trazodone and hydroxyzine. Pt's sister states the pt had a similar episode last month right before her period. Pt was hospitalized and released. 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 Pt states "I don't know how to live in this world".

## 2014-06-13 NOTE — ED Notes (Signed)
Bed: [facility] Expected date:  Expected time:  Means of arrival:  Comments: Tr3

## 2014-06-13 NOTE — ED Notes (Signed)
ONE PATIENT BELONGINGS BAG IN LOCKER 83

## 2014-06-14 ENCOUNTER — Other Ambulatory Visit (HOSPITAL_COMMUNITY): Payer: BC Managed Care – PPO

## 2014-06-14 DIAGNOSIS — F411 Generalized anxiety disorder: Secondary | ICD-10-CM

## 2014-06-14 DIAGNOSIS — R45851 Suicidal ideations: Secondary | ICD-10-CM

## 2014-06-14 DIAGNOSIS — F332 Major depressive disorder, recurrent severe without psychotic features: Secondary | ICD-10-CM

## 2014-06-14 LAB — COMPREHENSIVE METABOLIC PANEL
ALBUMIN: 3.7 g/dL (ref 3.5–5.2)
ALK PHOS: 82 U/L (ref 39–117)
ALT: 18 U/L (ref 0–35)
ANION GAP: 12 (ref 5–15)
AST: 18 U/L (ref 0–37)
BUN: 13 mg/dL (ref 6–23)
CO2: 24 mEq/L (ref 19–32)
Calcium: 9 mg/dL (ref 8.4–10.5)
Chloride: 103 mEq/L (ref 96–112)
Creatinine, Ser: 0.62 mg/dL (ref 0.50–1.10)
GFR calc Af Amer: >90 mL/min (ref >90)
GFR calc non Af Amer: >90 mL/min (ref >90)
Glucose, Bld: 116 mg/dL — ABNORMAL HIGH (ref 70–99)
POTASSIUM: 4.1 meq/L (ref 3.7–5.3)
Sodium: 139 mEq/L (ref 137–147)
TOTAL PROTEIN: 7.2 g/dL (ref 6.0–8.3)

## 2014-06-14 LAB — CBC
HEMATOCRIT: 34.9 % — AB (ref 36.0–46.0)
Hemoglobin: 11.7 g/dL — ABNORMAL LOW (ref 12.0–15.0)
MCH: 29.8 pg (ref 26.0–34.0)
MCHC: 33.5 g/dL (ref 30.0–36.0)
MCV: 89 fL (ref 78.0–100.0)
PLATELETS: 280 10*3/uL (ref 150–400)
RBC: 3.92 MIL/uL (ref 3.87–5.11)
RDW: 13.4 % (ref 11.5–15.5)
WBC: 8.6 10*3/uL (ref 4.0–10.5)

## 2014-06-14 LAB — RAPID URINE DRUG SCREEN, HOSP PERFORMED
Amphetamines: NOT DETECTED
Barbiturates: NOT DETECTED
Benzodiazepines: NOT DETECTED
COCAINE: NOT DETECTED
Opiates: NOT DETECTED
TETRAHYDROCANNABINOL: NOT DETECTED

## 2014-06-14 LAB — ETHANOL: Alcohol, Ethyl (B): <11 mg/dL (ref 0–11)

## 2014-06-14 LAB — SALICYLATE LEVEL: Salicylate Lvl: <2 mg/dL — ABNORMAL LOW (ref 2.8–20.0)

## 2014-06-14 LAB — ACETAMINOPHEN LEVEL

## 2014-06-14 MED ORDER — ALUM & MAG HYDROXIDE-SIMETH 200-200-20 MG/5ML PO SUSP: 30.0000 mL | ORAL | Status: DC | PRN

## 2014-06-14 MED ORDER — TRAZODONE HCL 50 MG PO TABS
50.0000 mg | ORAL_TABLET | Freq: Every day | ORAL | Status: DC
  Administered 2014-06-14: 50 mg via ORAL
  Filled 2014-06-14: qty 1

## 2014-06-14 MED ORDER — IBUPROFEN 200 MG PO TABS: 600.0000 mg | ORAL_TABLET | Freq: Three times a day (TID) | ORAL | Status: DC | PRN

## 2014-06-14 MED ORDER — LORAZEPAM 1 MG PO TABS: 1.0000 mg | ORAL_TABLET | Freq: Three times a day (TID) | ORAL | Status: DC | PRN

## 2014-06-14 MED ORDER — ACETAMINOPHEN 325 MG PO TABS: 650.0000 mg | ORAL_TABLET | ORAL | Status: DC | PRN

## 2014-06-14 MED ORDER — ZOLPIDEM TARTRATE 5 MG PO TABS: 5.0000 mg | ORAL_TABLET | Freq: Every evening | ORAL | Status: DC | PRN

## 2014-06-14 MED ORDER — PERPHENAZINE 4 MG PO TABS
4.0000 mg | ORAL_TABLET | Freq: Every day | ORAL | Status: DC
Start: 2014-06-14 — End: 2014-06-14
  Administered 2014-06-14: 4 mg via ORAL
  Filled 2014-06-14 (×2): qty 1

## 2014-06-14 MED ORDER — NICOTINE 21 MG/24HR TD PT24: 21.0000 mg | MEDICATED_PATCH | Freq: Every day | TRANSDERMAL | Status: DC

## 2014-06-14 MED ORDER — HYDROXYZINE HCL 25 MG PO TABS: 25.0000 mg | ORAL_TABLET | Freq: Four times a day (QID) | ORAL | Status: DC | PRN

## 2014-06-14 MED ORDER — CALCIUM CARBONATE-VITAMIN D 500-200 MG-UNIT PO TABS
1.0000 | ORAL_TABLET | Freq: Two times a day (BID) | ORAL | Status: DC
  Administered 2014-06-14: 1 via ORAL
  Filled 2014-06-14 (×2): qty 1

## 2014-06-14 MED ORDER — ONDANSETRON HCL 4 MG PO TABS: 4.0000 mg | ORAL_TABLET | Freq: Three times a day (TID) | ORAL | Status: DC | PRN

## 2014-06-14 MED ORDER — VORTIOXETINE HBR 10 MG PO TABS
30.0000 mg | ORAL_TABLET | Freq: Every day | ORAL | Status: DC
  Administered 2014-06-14: 30 mg via ORAL
  Filled 2014-06-14: qty 3

## 2014-06-14 NOTE — Consult Note (Signed)
[facility] Face-to-face Psychiatry Consult  Subjective: Pt seen and chart reviewed. 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 Pt reports extremely high stress levels with suicidal thoughts leading to thoughts of overdosing on all of her Trazodone and Hydroxyzine. Pt denies HI and AVH, but is very tearful and expressing feelings of panic during this assessment, referencing her recent discharge from [facility], indicating that she was not yet ready to leave apparently, but that she thought maybe she was. Per this NP and Dr. Darleene Cleaver, pt to be admitted for inpatient stabilization. No beds at [facility]. Accepted to [facility].   HPI: Toni Alvarado is a 48 y.o. female who voluntarily presents to [facility] with SI/depression and anxiety.  Pt d/c from [facility], 1 week ago and states she felt stronger and ready to handle life, however she states, today she began to feel overwhelmed while grading papers for class.  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  Pt says she's having trouble adjusting to the changes.  Pt is SI with a plan to overdose on trazodone.  Pt says she is frightened because she researched google to find out how many trazodone to ingest for SI attempt.  Pt says--"I feel unpredictable".  Pt is tearful during interview and says she doesn't understand why she continues to feel the same after both inpt and outpt services.  Pt has a previous SI attempt by overdose in 1991.  She reports increased anxiety with panic attacks at least 2-3x's a week.  Pt denies HI/AVH.  She is currently engaged in outpt services with Dr. Clovis Pu and Dortha Kern( [facility]).  Pt admits she texted her family/friends and told them "goodbye" and "I love you".       Axis I: Generalized Anxiety Disorder and Major Depression, Recurrent severe Axis II: Deferred Axis III:  Past Medical History  Diagnosis Date  . Anemia   . Depression   . B12 deficiency   . Anxiety    Axis IV: economic problems, other psychosocial or environmental problems, problems related to social environment and problems with primary support group Axis V: 41-50 serious symptoms  Psychiatric Specialty Exam: Physical Exam  ROS  Blood pressure 105/60, pulse 78, temperature 97.9 F (36.6 C), temperature source Oral, resp. rate 17, last menstrual period 05/14/2014, SpO2 99.00%.There is no weight on file to calculate BMI.  General Appearance: Casual and Fairly Groomed  Eye Contact::  Good  Speech:  Blocked and Clear and Coherent  Volume:  Normal  Mood:  Anxious, Depressed, Hopeless and Irritable  Affect:  Depressed, Restricted and Tearful  Thought Process:  Coherent and Goal Directed  Orientation:  Full (Time, Place, and Person)  Thought Content:  WDL  Suicidal Thoughts:  Yes.  with intent/plan  Homicidal Thoughts:  No  Memory:  Immediate;   Good Recent;   Good Remote;   Good  Judgement:  Fair  Insight:  Fair  Psychomotor Activity:  Increased  Concentration:  Good  Recall:  Good  Akathisia:  No  Handed:    AIMS (if indicated):     Assets:  Communication Skills Desire for Improvement Housing Resilience Social Support  Sleep:         Past Medical History:  Past Medical History  Diagnosis Date  . Anemia   . Depression   . B12 deficiency   . Anxiety     Past Surgical History  Procedure Laterality Date  . Lithotripsy      Family History:  Family History  Problem Relation Age of Onset  . Hyperlipidemia Mother   . Mental illness Mother   . Diabetes Father   . Heart disease Father   . Hyperlipidemia Father   . Stroke Father   . Mental illness Father   . Mental illness Brother   . Cancer Brother   . Breast cancer Sister     Social History:   reports that she has never smoked. She has never used smokeless tobacco. She reports that she does not drink alcohol or use illicit drugs.  Additional Social History:  Alcohol / Drug Use Pain Medications: See MAR  Prescriptions: See MAR  Over the Counter: See MAR  History of alcohol / drug use?: No history of alcohol / drug abuse Longest period of sobriety (when/how long): None   CIWA: CIWA-Ar BP: 105/60 mmHg Pulse Rate: 78 COWS:    PATIENT STRENGTHS: (choose at least two) Communication skills Motivation for treatment/growth Supportive family/friends Work skills  Allergies: No Known Allergies  Disposition:  -[facility] to [facility] for inpatient psychiatric [facility] with accepting physician as Dr. Dareen Piano.   Benjamine Mola, FNP-BC 06/14/2014 2:06 PM

## 2014-06-14 NOTE — BH Assessment (Signed)
Tele Assessment Note   Toni Alvarado is a 48 y.o. female who voluntarily presents to [facility] with SI/depression and anxiety.  Pt d/c from [facility], 1 week ago and states she felt stronger and ready to handle life, however she states, today she began to feel overwhelmed while grading papers for class.  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  Pt says she's having trouble adjusting to the changes.    Pt is SI with a plan to overdose on trazodone.  Pt says she is frightened because she researched google to find out how many trazodone to ingest for SI attempt.  Pt says--"I feel unpredictable".  Pt is tearful during interview and says she doesn't understand why she continues to feel the same after both inpt and outpt services.  Pt has a previous SI attempt by overdose in 1991.  She reports increased anxiety with panic attacks at least 2-3x's a week.  Pt denies HI/AVH.  She is currently engaged in outpt services with Dr. Clovis Pu and Dortha Kern( [facility]).  Pt admits she texted her family/friends and told them "goodbye" and "I love you".      Axis I: Anxiety Disorder NOS and Major Depression, Recurrent severe Axis II: Deferred Axis III:  Past Medical History  Diagnosis Date  . Anemia   . Depression   . B12 deficiency   . Anxiety    Axis IV: economic problems, other psychosocial or environmental problems, problems related to social environment and problems with primary support group Axis V: 31-40 impairment in reality testing  Past Medical History:  Past Medical History  Diagnosis Date  . Anemia   . Depression   . B12 deficiency   . Anxiety     Past Surgical History  Procedure Laterality Date  . Lithotripsy      Family History:  Family History  Problem Relation Age of Onset  . Hyperlipidemia Mother   . Mental illness Mother   .  Diabetes Father   . Heart disease Father   . Hyperlipidemia Father   . Stroke Father   . Mental illness Father   . Mental illness Brother   . Cancer Brother   . Breast cancer Sister     Social History:  reports that she has never smoked. She has never used smokeless tobacco. She reports that she does not drink alcohol or use illicit drugs.  Additional Social History:  Alcohol / Drug Use Pain Medications: See MAR  Prescriptions: See MAR  Over the Counter: See MAR  History of alcohol / drug use?: No history of alcohol / drug abuse Longest period of sobriety (when/how long): None   CIWA: CIWA-Ar BP: 115/53 mmHg Pulse Rate: 81 COWS:    PATIENT STRENGTHS: (choose at least two) Communication skills Motivation for treatment/growth Supportive family/friends Work skills  Allergies: No Known Allergies  Home Medications:  (Not in a hospital admission)  OB/GYN Status:  Patient's last menstrual period was 05/14/2014.  General Assessment Data Location of Assessment: [facility] Is this a Tele or Face-to-Face Assessment?: Face-to-Face Is this an Initial Assessment or a Re-assessment for this encounter?: Initial Assessment Living Arrangements: Children (69 yo daughter in the home ) Can pt return to current living arrangement?: Yes Admission Status: Voluntary Is patient capable of signing voluntary admission?: Yes Transfer from: Home Referral Source: Self/Family/Friend  Medical Screening Exam ([place]) Medical Exam completed: No Reason for MSE not completed: Other: (None )  [place] Living Arrangements: Children (50 yo daughter in the home ) Name of Psychiatrist: Dr. Clovis Pu  Name of Therapist: Stanton Kidney Dale--[facility]   Education Status Is patient currently in school?: No Current Grade: None  Highest grade of school patient has completed: None  Name of school: None  Contact person: None   Risk to self with the past 6 months Suicidal Ideation:  Yes-Currently Present Suicidal Intent: No-Not Currently/Within Last 6 Months Is patient at risk for suicide?: Yes Suicidal Plan?: Yes-Currently Present Specify Current Suicidal Plan: Overdose on Trazodone  Access to Means: Yes Specify Access to Suicidal Means: Pills What has been your use of drugs/alcohol within the last 12 months?: None  Previous Attempts/Gestures: Yes How many times?: 1 Other Self Harm Risks: None  Triggers for Past Attempts: Other personal contacts Intentional Self Injurious Behavior: None Family Suicide History: Yes (Mother attempted SI ) Recent stressful life event(s): Financial Problems;Other (Comment) (Work-related stress) Persecutory voices/beliefs?: No Depression: Yes Depression Symptoms: Loss of interest in usual pleasures;Feeling worthless/self pity;Tearfulness;Fatigue;Despondent Substance abuse history and/or treatment for substance abuse?: No Suicide prevention information given to non-admitted patients: Not applicable  Risk to Others within the past 6 months Homicidal Ideation: No Thoughts of Harm to Others: No Current Homicidal Intent: No Current Homicidal Plan: No Access to Homicidal Means: No Identified Victim: None  History of harm to others?: No Assessment of Violence: None Noted Violent Behavior Description: None  Does patient have access to weapons?: No Criminal Charges Pending?: No Does patient have a court date: No  Psychosis Hallucinations: None noted Delusions: None noted  Mental Status Report Appear/Hygiene: In scrubs Eye Contact: Good Motor Activity: Unremarkable Speech: Logical/coherent;Soft Level of Consciousness: Alert Mood: Depressed;Sad Affect: Depressed;Flat Anxiety Level: None Thought Processes: Relevant;Coherent Judgement: Impaired Orientation: Person;Place;Time;Situation Obsessive Compulsive Thoughts/Behaviors: None  Cognitive Functioning Concentration: Normal Memory: Recent Intact;Remote Intact IQ:  Average Insight: Poor Impulse Control: Fair Appetite: Good Weight Loss: 0 Weight Gain: 0 Sleep: No Change Total Hours of Sleep: 8 Vegetative Symptoms: None  ADLScreening [facility] Assessment Services) Patient's cognitive ability adequate to safely complete daily activities?: Yes Patient able to express need for assistance with ADLs?: Yes Independently performs ADLs?: Yes (appropriate for developmental age)  Prior Inpatient Therapy Prior Inpatient Therapy: Yes Prior Therapy Dates: 2004,2008,2015 Prior Therapy Facilty/Provider(s): Sena [facility], [facility]  Reason for Treatment: SI/Depression   Prior Outpatient Therapy Prior Outpatient Therapy: Yes Prior Therapy Dates: Current  Prior Therapy Facilty/Provider(s): Dr. Clovis Pu [facility]  Reason for Treatment: Med Mgt/ Therapy   ADL Screening (condition at time of admission) Patient's cognitive ability adequate to safely complete daily activities?: Yes Is the patient deaf or have difficulty hearing?: No Does the patient have difficulty seeing, even when wearing glasses/contacts?: No Does the patient have difficulty concentrating, remembering, or making decisions?: No Patient able to express need for assistance with ADLs?: Yes Does the patient have difficulty dressing or bathing?: No Independently performs ADLs?: Yes (appropriate for developmental age) Does the patient have difficulty walking or climbing stairs?: No Weakness of Legs: None Weakness of Arms/Hands: None  Home Assistive Devices/Equipment Home Assistive Devices/Equipment: None  Therapy Consults (therapy consults require a physician order) PT Evaluation Needed: No OT Evalulation Needed: No SLP Evaluation Needed: No Abuse/Neglect Assessment (Assessment to be complete while patient is alone) Physical Abuse: Denies Verbal Abuse: Denies Sexual Abuse: Denies Exploitation of patient/patient's resources: Denies Self-Neglect: Denies  Values / Beliefs Cultural Requests  During Hospitalization: None Spiritual Requests During Hospitalization: None Consults Spiritual Care Consult Needed: No Social Work Consult Needed: No Regulatory affairs officer (For Healthcare) Does patient have an advance directive?: No Would patient like information on creating an advanced directive?: No - patient declined information Nutrition Screen- [facility] Adult/[facility] Patient's home diet: Regular  Additional Information 1:1 In Past 12 Months?: No CIRT Risk: No Elopement Risk: No Does patient have medical clearance?: Yes     Disposition:  Disposition Initial Assessment Completed for this Encounter: Yes Disposition of Patient: Referred to (AM psych eval ) Type of inpatient treatment program: Adult Patient referred to: Other (Comment) (AM psych eval for final disposition)  Girtha Rm 06/14/2014 2:54 AM

## 2014-06-14 NOTE — ED Provider Notes (Signed)
Medical screening examination/treatment/procedure(s) were conducted as a shared visit with non-physician practitioner(s) and myself.  I personally evaluated the patient during the encounter.   EKG Interpretation None      TTS to evaluate. Hx of Suidcide attempt in 1991.   Hoy Morn, MD 06/14/14 [phone]

## 2014-06-14 NOTE — ED Notes (Signed)
Bed: WA20 Expected date:  Expected time:  Means of arrival:  Comments: 

## 2014-06-14 NOTE — BH Assessment (Signed)
Received a call from [place] with [facility]. Sts that patient is accepted to [facility] by Dr. Dareen Piano. Nursing report # is [phone].

## 2014-06-14 NOTE — ED Notes (Signed)
Patient moved to RM 20 for TTS assessment.  Pt will come back out of room to hall bed or SAPPU bed by morning.

## 2014-06-14 NOTE — ED Notes (Signed)
[facility] called for transport. 

## 2014-06-15 ENCOUNTER — Other Ambulatory Visit (HOSPITAL_COMMUNITY): Payer: BC Managed Care – PPO

## 2014-06-16 ENCOUNTER — Other Ambulatory Visit (HOSPITAL_COMMUNITY): Payer: BC Managed Care – PPO

## 2014-06-17 ENCOUNTER — Other Ambulatory Visit (HOSPITAL_COMMUNITY): Payer: BC Managed Care – PPO

## 2014-06-18 ENCOUNTER — Other Ambulatory Visit (HOSPITAL_COMMUNITY): Payer: BC Managed Care – PPO

## 2014-06-21 ENCOUNTER — Other Ambulatory Visit (HOSPITAL_COMMUNITY): Payer: BC Managed Care – PPO

## 2014-06-22 ENCOUNTER — Other Ambulatory Visit (HOSPITAL_COMMUNITY): Payer: BC Managed Care – PPO

## 2014-06-23 ENCOUNTER — Other Ambulatory Visit (HOSPITAL_COMMUNITY): Payer: BC Managed Care – PPO

## 2014-06-24 ENCOUNTER — Other Ambulatory Visit (HOSPITAL_COMMUNITY): Payer: BC Managed Care – PPO

## 2014-06-25 ENCOUNTER — Other Ambulatory Visit (HOSPITAL_COMMUNITY): Payer: BC Managed Care – PPO

## 2014-06-28 ENCOUNTER — Other Ambulatory Visit (HOSPITAL_COMMUNITY): Payer: BC Managed Care – PPO

## 2014-06-29 ENCOUNTER — Other Ambulatory Visit (HOSPITAL_COMMUNITY): Payer: BC Managed Care – PPO

## 2014-09-14 ENCOUNTER — Encounter: Payer: Self-pay | Admitting: Internal Medicine

## 2015-04-14 ENCOUNTER — Encounter: Payer: Self-pay | Admitting: Internal Medicine

## 2015-12-20 ENCOUNTER — Ambulatory Visit
Admission: RE | Admit: 2015-12-20 | Discharge: 2015-12-20 | Disposition: A | Discharge status: Home / Self Care | Payer: BC Managed Care – PPO | Source: Ambulatory Visit | Attending: Family Medicine | Admitting: Family Medicine

## 2015-12-20 ENCOUNTER — Other Ambulatory Visit: Payer: Self-pay | Admitting: Family Medicine

## 2015-12-20 DIAGNOSIS — M545 Low back pain, unspecified: Secondary | ICD-10-CM

## 2015-12-20 DIAGNOSIS — M79604 Pain in right leg: Secondary | ICD-10-CM

## 2016-05-08 ENCOUNTER — Other Ambulatory Visit: Payer: Self-pay | Admitting: Orthopaedic Surgery

## 2016-05-08 DIAGNOSIS — M545 Low back pain: Secondary | ICD-10-CM

## 2016-09-19 ENCOUNTER — Other Ambulatory Visit: Payer: Self-pay | Admitting: Orthopaedic Surgery

## 2016-09-19 DIAGNOSIS — M545 Low back pain: Secondary | ICD-10-CM

## 2016-09-25 ENCOUNTER — Ambulatory Visit
Admission: RE | Admit: 2016-09-25 | Discharge: 2016-09-25 | Disposition: A | Discharge status: Home / Self Care | Payer: BC Managed Care – PPO | Source: Ambulatory Visit | Attending: Orthopaedic Surgery | Admitting: Orthopaedic Surgery

## 2016-09-25 DIAGNOSIS — M545 Low back pain: Secondary | ICD-10-CM

## 2017-10-22 ENCOUNTER — Other Ambulatory Visit (HOSPITAL_COMMUNITY): Payer: BC Managed Care – PPO | Attending: Psychiatry | Admitting: Licensed Clinical Social Worker

## 2017-10-22 DIAGNOSIS — F332 Major depressive disorder, recurrent severe without psychotic features: Secondary | ICD-10-CM | POA: Diagnosis not present
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
## 2017-10-23 ENCOUNTER — Other Ambulatory Visit (HOSPITAL_COMMUNITY): Payer: BC Managed Care – PPO | Admitting: Licensed Clinical Social Worker

## 2017-10-23 DIAGNOSIS — F332 Major depressive disorder, recurrent severe without psychotic features: Secondary | ICD-10-CM

## 2017-10-23 NOTE — Psych (Signed)
   [facility] BH PHP THERAPIST PROGRESS NOTE  [name] [id number]  Session Time: 9:00 - 10:45  Participation Level: Active  Behavioral Response: CasualAlertDepressed  Type of Therapy: Group Therapy  Treatment Goals addressed: Coping  Interventions: CBT, DBT, Solution Focused, Supportive and Reframing  Summary: Clinician led check-in regarding current stressors and situation, and review of patient completed daily inventory. Clinician utilized active listening and empathetic response and validated patient emotions. Clinician facilitated processing group on pertinent issues.   Therapist Response: Toni Alvarado is a 52 y.o. female who presents with depression and anxiety symptoms. Patient arrived within time allowed and reports that she is feeling "fair". Patient rates her mood at an "2" on a scale of 1-10 with 10 being great. Patient reports that she is very depressed this morning and is struggling with not being able to work right now. Patient engaged in activity and discussion.     Session Time: 10:45 -12:15  Participation Level: Active  Behavioral Response: CasualAlertDepressed  Type of Therapy: Group Therapy, psychotherapy  Treatment Goals addressed: Coping  Interventions: strengths based, reframing, Supportive,   Summary:  Spiritual Care group  Therapist Response: Patient engaged in group. See chaplain note.        Session Time: 12:15 - 1:00  Participation Level: Active  Behavioral Response: CasualAlertDepressed  Type of Therapy: Group Therapy, Activity Therapy  Treatment Goals addressed: Coping  Interventions: Systems analyst, Supportive  Summary:  Reflection Group: Patients encouraged to practice skills and interpersonal techniques or work on mindfulness and relaxation techniques. The importance of self-care and making skills part of a routine to increase usage were stressed   Therapist Response: Patient engaged and participated appropriately.        Session Time: 1:00- 2:00  Participation Level: Active  Behavioral Response: CasualAlertAnxious and Depressed  Type of Therapy: Group Therapy, Psychoeducation; Psychotherapy  Treatment Goals addressed: Coping  Interventions: CBT; Solution focused; Supportive; Reframing  Summary: 1:00 - 1:50: Relaxation group: Cln [name] led yoga group focused on retraining the body's response to stress 1:50 -2:00 Clinician led check-out. Clinician assessed for immediate needs, medication compliance and efficacy, and safety concerns   Therapist Response: Patient engaged activity and discussion.  At [place], patient rates her mood at a 3 on a scale of 1-10 with 10 being great. Patient reports that she has plans of taking a nap after group. Patient demonstrates some progress as evidenced by participating on her first day of group. Patient denies SI/Hi/self-harm at the end of group.      Suicidal/Homicidal: Nowithout intent/plan  Plan: Pt will continue in PHP and work towards decreasing depression and anxiety symptoms while increasing ADL's and ability to self manage symptoms.  Diagnosis: Severe episode of recurrent major depressive disorder, without psychotic features ([place]) [F33.2]    1. Severe episode of recurrent major depressive disorder, without psychotic features ([place])       Lorin Glass, LCSW 10/23/2017

## 2017-10-23 NOTE — Progress Notes (Addendum)
Psychiatric Initial Adult Assessment   Patient Identification: Toni Alvarado MRN:  [id number] Date of Evaluation:  10/23/2017 Referral Source: Self Chief Complaint:  Depression and Anxiety  Visit Diagnosis: No diagnosis found.  History of Present Illness:  Toni Alvarado 52 y.o female. Present with a flat and blunted affect. Reported history of ongoing depression and anxiety. Reports multiple work and life stressors which is causing her to have passive thoughts of death. States she recently had to move back in with her ex husband to care for her son who has been chronically ill for the past 10 years. Reports she has two adult children 14 year old 59.year old. States must of her disagreement with her husband is over her son.   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 States she likes the increase in her salary, however feels it's to much responsibility without adequate support.   Toni Alvarado reports pervious inpatient admissions that she felt wasn't helpful and was "almost traumatizing". States she has attended Intensive Outpatient programs in the past and found that the class was helpful for her depression and she has a good support system. Reports she has been followed by Psychiatrist and Therapist and feels like she just gets overwhelmed with life. Reports she was recently started on Ativan for her anxiety attacks. Patient is currently denying suicidal or homicidal ideations during this assessment. Support, encouragement and reassurance was provided    Associated Signs/Symptoms: Depression Symptoms:  depressed mood, anhedonia, hopelessness, (Hypo) Manic Symptoms:  Distractibility, Impulsivity, Irritable Mood, Anxiety Symptoms:  Excessive Worry, Social Anxiety, Psychotic Symptoms:  Hallucinations: None PTSD Symptoms: Avoidance:  Decreased  Interest/Participation  Past Psychiatric History:   Previous Psychotropic Medications: YES  Substance Abuse History in the last 12 months:  No.  Consequences of Substance Abuse: NA  Past Medical History:  Past Medical History:  Diagnosis Date  . Anemia   . Anxiety   . B12 deficiency   . Depression     Past Surgical History:  Procedure Laterality Date  . LITHOTRIPSY      Family Psychiatric History:   Family History:  Family History  Problem Relation Age of Onset  . Hyperlipidemia Mother   . Mental illness Mother   . Diabetes Father   . Heart disease Father   . Hyperlipidemia Father   . Stroke Father   . Mental illness Father   . Mental illness Brother   . Cancer Brother   . Breast cancer Sister     Social History:   Social History   Socioeconomic History  . Marital status: Legally Separated    Spouse name: Not on file  . Number of children: Not on file  . Years of education: Not on file  . Highest education level: Not on file  Social Needs  . Financial resource strain: Not on file  . Food insecurity - worry: Not on file  . Food insecurity - inability: Not on file  . Transportation needs - medical: Not on file  . Transportation needs - non-medical: Not on file  Occupational History  . Not on file  Tobacco Use  . Smoking status: Never Smoker  . Smokeless tobacco: Never Used  Substance and Sexual Activity  . Alcohol use: No  . Drug use: No  . Sexual activity: Not on file  Other Topics Concern  . Not on file  Social History Narrative  .  Not on file    Additional Social History:   Allergies:  No Known Allergies  Metabolic Disorder Labs: No results found for: HGBA1C, MPG No results found for: PROLACTIN Lab Results  Component Value Date   CHOL 293 (HH) 01/02/2007   TRIG 249 (HH) 01/02/2007   HDL 42.5 01/02/2007   CHOLHDL 6.9 CALC 01/02/2007   VLDL 50 (H) 01/02/2007     Current Medications: Current Outpatient Medications  Medication Sig  Dispense Refill  . calcium-vitamin D (OSCAL WITH D) 500-200 MG-UNIT per tablet Take 1 tablet by mouth 2 (two) times daily. For bone health 30 tablet 0  . hydrOXYzine (ATARAX/VISTARIL) 25 MG tablet Take 1 tablet (25 mg total) by mouth every 6 (six) hours as needed for anxiety. 45 tablet 0  . LORazepam (ATIVAN) 1 MG tablet Take 1 tablet (1 mg total) by mouth every 6 (six) hours as needed for anxiety. 16 tablet 0  . perphenazine (TRILAFON) 4 MG tablet Take 1 tablet (4 mg total) by mouth at bedtime. Mood control 30 tablet 0  . traZODone (DESYREL) 50 MG tablet Take 1 tablet (50 mg total) by mouth at bedtime. For sleep 30 tablet 0  . Vortioxetine HBr (BRINTELLIX) 10 MG TABS Take 30 mg by mouth daily.     No current facility-administered medications for this visit.     Neurologic: Headache: No Seizure: No Paresthesias:No  Musculoskeletal: Strength & Muscle Tone: within normal limits Gait & Station: normal Patient leans: N/A  Psychiatric Specialty Exam: Review of Systems  Psychiatric/Behavioral: Positive for depression. Suicidal ideas: passive. The patient is nervous/anxious.   All other systems reviewed and are negative.   There were no vitals taken for this visit.There is no height or weight on file to calculate BMI.  General Appearance: Casual, flat guarded and tearful  Eye Contact:  Fair  Speech:  Clear and Coherent  Volume:  Decreased  Mood:  Anxious and Depressed  Affect:  Blunt, Depressed and Flat  Thought Process:  Coherent  Orientation:  Full (Time, Place, and Person)  Thought Content:  Hallucinations: None  Suicidal Thoughts:  No  Homicidal Thoughts:  No  Memory:  Immediate;   Fair Recent;   Fair Remote;   Fair  Judgement:  Fair  Insight:  Fair  Psychomotor Activity:  Normal  Concentration:  Concentration: Fair  Recall:  [employer] of Knowledge:Fair  Language: Fair  Akathisia:  No  Handed:  Right  AIMS (if indicated):   Assets:  Communication Skills Desire for  Improvement Resilience Social Support  ADL's:  Intact  Cognition: WNL  Sleep:      Treatment Plan Summary: Medication management   Admitted to Partial Hospitalization Program  MDD:   Continue Cymbalta 60 mg PO QHS    Start Rexulti as prescribed by Psychiatrist   Anxitey:  Continue Ativan 1 mg  PO PRN   Treatment Plan reviewed and agreed upon by NP. TLewis and patient Toni Alvarado need for group services  Derrill Center, NP 2/27/20199:43 AM

## 2017-10-24 ENCOUNTER — Other Ambulatory Visit (HOSPITAL_COMMUNITY): Payer: BC Managed Care – PPO | Admitting: Licensed Clinical Social Worker

## 2017-10-24 ENCOUNTER — Encounter (HOSPITAL_COMMUNITY): Payer: Self-pay

## 2017-10-24 ENCOUNTER — Other Ambulatory Visit (HOSPITAL_COMMUNITY): Payer: BC Managed Care – PPO

## 2017-10-24 VITALS — BP 130/76 | HR 87 | Ht 63.0 in | Wt 202.0 lb

## 2017-10-24 DIAGNOSIS — F332 Major depressive disorder, recurrent severe without psychotic features: Secondary | ICD-10-CM
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
## 2017-10-25 ENCOUNTER — Other Ambulatory Visit (HOSPITAL_COMMUNITY): Payer: BC Managed Care – PPO | Attending: Psychiatry | Admitting: Licensed Clinical Social Worker

## 2017-10-25 ENCOUNTER — Encounter (HOSPITAL_COMMUNITY): Payer: Self-pay | Admitting: Licensed Clinical Social Worker

## 2017-10-25 DIAGNOSIS — F332 Major depressive disorder, recurrent severe without psychotic features: Secondary | ICD-10-CM | POA: Diagnosis present

## 2017-10-25 DIAGNOSIS — Z79899 Other long term (current) drug therapy: Secondary | ICD-10-CM | POA: Insufficient documentation

## 2017-10-26 NOTE — Psych (Signed)
[facility] BH PHP THERAPIST PROGRESS NOTE  Toni Alvarado [id number]  Session Time: 9:00 - 10:30  Participation Level: Active  Behavioral Response: CasualAlertDepressed  Type of Therapy: Group Therapy  Treatment Goals addressed: Coping  Interventions: CBT, DBT, Solution Focused, Supportive and Reframing  Summary: Clinician led check-in regarding current stressors and situation, and review of patient completed daily inventory. Clinician utilized active listening and empathetic response and validated patient emotions. Clinician facilitated processing group on pertinent issues.   Therapist Response: Toni Alvarado is a 52 y.o. female who presents with depression and anxiety symptoms. Patient arrived within time allowed and reports that "I feel like my I am swimming in my head." Patient rates her mood at a 3.5-4 on a scale of 1-10 with 10 being great. Patient reports that she had a lot of negative self-talk last night and self-medicated by watching Netflix and eating comfort foods. Patient stated that she did not take her Ativan this morning to see if that could help with her head feeling swimmy. Patient reports that she is still working on finding healthy coping skills to help her when she has negative thoughts.       Session Time: 10:30 -12:00  Participation Level: Active  Behavioral Response: CasualAlertDepressed  Type of Therapy: Group Therapy, psychotherapy, psychoeducation  Treatment Goals addressed: Coping  Interventions: CBT, DBT, Solution Focused, Supportive and Reframing  Summary:  Clinician continued discussion on cognitive distortions and had patient point out at least two that they are struggling with.    Therapist Response: Patient engaged in activity and discussion. Patient identified the two cognitive distortions that she struggles with is disqualifying the positives and should statements. Patient shared that she is going to work on rephrasing her sentences when she says  "should.       Session Time: 12:00 - 12:45  Participation Level: Active  Behavioral Response: CasualAlertDepressed  Type of Therapy: Group Therapy, Activity Therapy  Treatment Goals addressed: Coping  Interventions: Systems analyst, Supportive  Summary:  Reflection Group: Patients encouraged to practice skills and interpersonal techniques or work on mindfulness and relaxation techniques. The importance of self-care and making skills part of a routine to increase usage were stressed   Therapist Response: Patient engaged and participated appropriately.       Session Time: 12:50- 2:00  Participation Level: Active  Behavioral Response: CasualAlertDepressed  Type of Therapy: Group Therapy, Psychoeducation; Psychotherapy  Treatment Goals addressed: Coping  Interventions: CBT; Solution focused; Supportive; Reframing  Summary: 1:00 - 1:50: Clinician led a discussion on the characteristics of boundaries and had patients identify examples of rigid, porous, and healthy boundaries in their life.  1:50 -2:00 Clinician led check-out. Clinician assessed for immediate needs, medication compliance and efficacy, and safety concerns   Therapist Response: Patient engaged activity and discussion.  At Toni Alvarado, patient rates her mood at a 4.5 on a scale of 1-10 with 10 being great. Patient reported that she is going to take a nap after group and relax for the rest of the evening. Patient demonstrates some progress as evidenced by discussing Toni ways she can distract herself besides binge eating and setting up healthy boundaries with her principle at work. Patient denies SI/HI/self-harm at the end of group.      Suicidal/Homicidal: Nowithout intent/plan  Plan: Pt will continue in PHP and work towards decreasing depression and anxiety symptoms while increasing ADL's and ability to self manage symptoms.  Diagnosis: Severe episode of recurrent major depressive disorder, without  psychotic features ([place]) [F33.2]  1. Severe episode of recurrent major depressive disorder, without psychotic features ([place])       Lorin Glass, LCSW 10/26/2017

## 2017-10-26 NOTE — Psych (Signed)
[facility] BH PHP THERAPIST PROGRESS NOTE  Toni Alvarado [id number]  Session Time: 9:00 - 10:15  Participation Level: Active  Behavioral Response: CasualAlertDepressed  Type of Therapy: Group Therapy  Treatment Goals addressed: Coping  Interventions: CBT, DBT, Solution Focused, Supportive and Reframing  Summary: Clinician led check-in regarding current stressors and situation, and review of patient completed daily inventory. Clinician utilized active listening and empathetic response and validated patient emotions. Clinician facilitated processing group on pertinent issues.   Therapist Response: Toni Alvarado is a 52 y.o. female who presents with depression and anxiety symptoms. Patient arrived within time allowed and reports that she is "concerned about her son." Patient rates her mood at a 3-4 on a scale of 1-10 with 10 being great. Patient reports that she is struggling with having guilt about not medically providing for her son like she is supposed to. Patient is finding it hard that her son is dependent on her for everything Patient reports that she is still working on  how to communicate with her son the importance of independence without making him upset. Patient engaged in discussion.       Session Time: 10:15 -11:00  Participation Level: Active  Behavioral Response: CasualAlertDepressed  Type of Therapy: Group Therapy, psychoeducation, psychotherapy  Treatment Goals addressed: Coping  Interventions: CBT, DBT, Solution Focused, Supportive and Reframing  Summary:  Clinician continued discussion on boundaries. Clinician reviewed the different types of boundaries and had patients identified examples of healthy and unhealthy boundaries within each boundary type.   Therapist Response: Patient participated and reports understanding of the different boundary types.         Session Time: 11:00 - 12:00  Participation Level: Active  Behavioral Response:  CasualAlertDepressed  Type of Therapy: Group Therapy, Psychotherapy  Treatment Goals addressed: Coping  Interventions: CBT, Solution focused, Supportive, Reframing  Summary:  Clinician led patients through a boundary exploration worksheet. Group members then created a  specific action plan to work on an unhealthy boundary in their life.    Therapist Response: Patient reported that she needs to work on emotional, intellectual, and time boundaries with her son. Patient shared that she is going to start communicating with her son certain tasks that he can do on his own from now on. Patient plans setting specific goals for her son to complete to help improve his independence such as making his own doctors' appointments and getting refills on his supplements when it is needed.          Session Time: 12:00- 1:00  Participation Level: Active  Behavioral Response: CasualAlertDepressed  Type of Therapy: Group Therapy, Psychoeducation; Psychotherapy  Treatment Goals addressed: Coping  Interventions: CBT; Solution focused; Supportive; Reframing  Summary: 12:00 - 12:50 Group watched "The Agony of Unsubscribing" TedTalk and discussed the topic of  Perspective and how it can change our view of reality. 12:50 -1:00 Clinician led check-out. Clinician assessed for immediate needs, medication compliance and efficacy, and safety concerns   Therapist Response: Patient engaged in discussion. Patient reported that she is going to start setting up healthy boundaries and setting goals for herself to find a new place to live.  At [place], patient rates her mood at a 4.5 on a scale of 1-10 with 10 being great. Patient reports that she is going to take her son to treatment this weekend in [place] and have the conversation with her son about independence. Patient demonstrates some progress as evidenced identifying the areas in her life that she needs to work on  and defining actions steps that she is going  to take. Patient denies SI/HI/self-harm at the end of group.       Suicidal/Homicidal: Nowithout intent/plan  Plan: Pt will continue in PHP and work towards decreasing depression and anxiety symptoms while increasing ADL's and ability to self manage symptoms.  Diagnosis: Severe episode of recurrent major depressive disorder, without psychotic features ([place]) [F33.2]    1. Severe episode of recurrent major depressive disorder, without psychotic features ([place])       Toni Glass, LCSW 10/26/2017

## 2017-10-28 ENCOUNTER — Other Ambulatory Visit (HOSPITAL_COMMUNITY): Payer: BC Managed Care – PPO | Admitting: Licensed Clinical Social Worker

## 2017-10-28 ENCOUNTER — Other Ambulatory Visit (HOSPITAL_COMMUNITY): Payer: Self-pay

## 2017-10-28 DIAGNOSIS — F332 Major depressive disorder, recurrent severe without psychotic features: Secondary | ICD-10-CM

## 2017-10-28 NOTE — Psych (Signed)
   [facility] BH PHP THERAPIST PROGRESS NOTE  Toni Alvarado [id number]  Session Time: 9:00 - 10:30  Participation Level: Active  Behavioral Response: CasualAlertDepressed  Type of Therapy: Group Therapy  Treatment Goals addressed: Coping  Interventions: CBT, DBT, Solution Focused, Supportive and Reframing  Summary: Clinician led check-in regarding current stressors and situation, and review of patient completed daily inventory. Clinician utilized active listening and empathetic response and validated patient emotions. Clinician facilitated processing group on pertinent issues.   Therapist Response: Toni Alvarado is a 52 y.o. female who presents with depression and anxiety symptoms. Patient arrived within time allowed and reports she is feeling "more energized." Patient rates her mood at a 7 on a scale of 1-10 with 10 being great. Patient reports thinking her medication is helping her energy level and that she was able to be productive this weekend. Pt shares she had discussions with her son and ex-husband regarding boundaries. Patient reports that she is still working on feeling as if she cannot control her emotions.       Session Time: 10:30 - 12:00  Participation Level: Active  Behavioral Response: CasualAlertDepressed  Type of Therapy: Group Therapy, Psychotherapy  Treatment Goals addressed: Coping  Interventions: CBT, Solution focused, Supportive, Reframing  Summary:  Cln introduced distress tolerance skills, reviewing their purpose and how to practice them. Cln introduced STOP and TIP skills. Pt's discussed how they could utilize these skills.      Therapist Response: Patient engaged in discussion. Pt reports understanding of skills discussed and shares most likely to use progressive muscle relaxation.        Session Time: 12:00 - 12:45  Participation Level: Active  Behavioral Response: CasualAlertDepressed  Type of Therapy: Group Therapy, Activity Therapy  Treatment  Goals addressed: Coping  Interventions: Systems analyst, Supportive  Summary:  Reflection Group: Patients encouraged to practice skills and interpersonal techniques or work on mindfulness and relaxation techniques. The importance of self-care and making skills part of a routine to increase usage were stressed   Therapist Response: Patient engaged and participated appropriately.       Session Time: 12:45- 2:00  Participation Level: Active  Behavioral Response: CasualAlertDepressed  Type of Therapy: Group Therapy, Psychoeducation; Psychotherapy  Treatment Goals addressed: Coping  Interventions: CBT; Solution focused; Supportive; Reframing  Summary: 12:45 - 1:50: Clinician continued topic of distress tolerance skills and introduced ACCEPTS skill. Group did "Top 5 List" activity to plan ahead for "E," different emotion.  1:50 -2:00 Clinician led check-out. Clinician assessed for immediate needs, medication compliance and efficacy, and safety concerns   Therapist Response: Patient engaged activity and discussion. Pt reports watching tv as a way to utilize "A," activities, and completed 3 top 5 lists.  At [place], patient rates her mood at a 7.5 on a scale of 1-10 with 10 being great. Patient reports she plans to take her son to an appointment and work on her to-do list tonight.  Patient demonstrates some progress as evidenced by increased energy and vigor in group. Patient denies SI/HI/self-harm at the end of group.     Suicidal/Homicidal: Nowithout intent/plan  Plan: Pt will continue in PHP and work towards decreasing depression and anxiety symptoms while increasing ADL's and ability to self manage symptoms.  Diagnosis: Severe episode of recurrent major depressive disorder, without psychotic features ([place]) [F33.2]    1. Severe episode of recurrent major depressive disorder, without psychotic features ([place])       Toni Glass, LCSW 10/28/2017

## 2017-10-29 ENCOUNTER — Ambulatory Visit (HOSPITAL_COMMUNITY): Payer: Self-pay

## 2017-10-29 ENCOUNTER — Encounter (HOSPITAL_COMMUNITY): Payer: Self-pay | Admitting: Occupational Therapy

## 2017-10-29 ENCOUNTER — Other Ambulatory Visit (HOSPITAL_COMMUNITY): Payer: Self-pay

## 2017-10-29 ENCOUNTER — Other Ambulatory Visit (HOSPITAL_COMMUNITY): Payer: BC Managed Care – PPO | Admitting: Occupational Therapy

## 2017-10-29 ENCOUNTER — Other Ambulatory Visit (HOSPITAL_COMMUNITY): Payer: BC Managed Care – PPO | Admitting: Licensed Clinical Social Worker

## 2017-10-29 DIAGNOSIS — F332 Major depressive disorder, recurrent severe without psychotic features: Secondary | ICD-10-CM

## 2017-10-29 DIAGNOSIS — R4589 Other symptoms and signs involving emotional state: Secondary | ICD-10-CM

## 2017-10-29 DIAGNOSIS — F0789 Other personality and behavioral disorders due to known physiological condition: Secondary | ICD-10-CM

## 2017-10-30 ENCOUNTER — Other Ambulatory Visit (HOSPITAL_COMMUNITY): Payer: BC Managed Care – PPO | Admitting: Licensed Clinical Social Worker

## 2017-10-30 ENCOUNTER — Other Ambulatory Visit (HOSPITAL_COMMUNITY): Payer: Self-pay

## 2017-10-30 DIAGNOSIS — F332 Major depressive disorder, recurrent severe without psychotic features: Secondary | ICD-10-CM | POA: Diagnosis not present

## 2017-10-30 NOTE — Progress Notes (Signed)
BH MD/PA/NP Partial  Progress Note  10/30/2017 1:07 PM Toni Alvarado  MRN:  [id number]  Evaluation :  Toni Alvarado seen attending group session. Toni Alvarado present with a brighter affect today. States she feels as if the Rexulti is helping with her mood. Reports her depression has decreased  somewhat. Toni Alvarado reports slight apprehension with returning  back to work (March 15th)  however reports she would like to start slowly. Toni Alvarado is currently denying suicidal or homicidal ideation. Denies auditory or visual hallucination and does not appear to be responding to internal stimuli. Reports group sessions has been beneficial and is helping.  Toni Alvarado states  her anxiety is somewhat under control, however hasn't been working which is where most of her stress comes from. Reports her home life hasn't been a major issue as of lately. Reports good appetite and states she is resting "okay". Support, encouragement and reassurance was provided.   HPI: noted per admission note: Toni Alvarado 52 y.o female. Present with a flat and blunted affect. Reported history of ongoing depression and anxiety. Reports multiple work and life stressors which is causing her to have passive thoughts of death. States she recently had to move back in with her ex husband to care for her son who has been chronically ill for the past 10 years. Reports she has two adult children 52 year old 39.year old. 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 States she likes the increase in her salary, however feels it's to much responsibility without adequate support.  Visit Diagnosis: No diagnosis found.  Past Psychiatric History:   Past Medical History:  Past Medical History:  Diagnosis Date  . Anemia   . Anxiety   . B12 deficiency   . Depression     Past  Surgical History:  Procedure Laterality Date  . LITHOTRIPSY      Family Psychiatric History:   Family History:  Family History  Problem Relation Age of Onset  . Hyperlipidemia Mother   . Mental illness Mother   . Alcohol abuse Mother   . Depression Mother   . Physical abuse Mother   . Diabetes Father   . Heart disease Father   . Hyperlipidemia Father   . Stroke Father   . Mental illness Father   . Alcohol abuse Father   . Bipolar disorder Father   . Mental illness Brother   . Cancer Brother   . ADD / ADHD Brother   . Alcohol abuse Brother   . Bipolar disorder Brother   . Sexual abuse Brother   . Alcohol abuse Maternal Grandfather   . Alcohol abuse Paternal Grandfather   . Breast cancer Sister   . Alcohol abuse Maternal Aunt   . Anxiety disorder Maternal Aunt   . Depression Maternal Aunt   . Drug abuse Maternal Aunt   . Alcohol abuse Maternal Uncle     Social History:  Social History   Socioeconomic History  . Marital status: Legally Separated    Spouse name: Not on file  . Number of children: Not on file  . Years of education: Not on file  . Highest education level: Not on file  Social Needs  . Financial resource strain: Not very hard  . Food insecurity - worry: Never true  . Food insecurity - inability: Never true  .  Transportation needs - medical: No  . Transportation needs - non-medical: No  Occupational History  . Not on file  Tobacco Use  . Smoking status: Current Every Day Smoker    Packs/day: 0.50    Types: Cigarettes  . Smokeless tobacco: Never Used  . Tobacco comment: Had quit for 10 years and recently restarted. Not interested in quiting at this time  Substance and Sexual Activity  . Alcohol use: Yes    Comment: Occasional use but did drink some the previous week to help wiht anxitety per patient  report   . Drug use: No  . Sexual activity: Yes    Partners: Male    Birth control/protection: None  Other Topics Concern  . Not on file   Social History Narrative  . Not on file    Allergies: No Known Allergies  Metabolic Disorder Labs: No results found for: HGBA1C, MPG No results found for: PROLACTIN Lab Results  Component Value Date   CHOL 293 (HH) 01/02/2007   TRIG 249 (HH) 01/02/2007   HDL 42.5 01/02/2007   CHOLHDL 6.9 CALC 01/02/2007   VLDL 50 (H) 01/02/2007   Lab Results  Component Value Date   TSH 2.180 05/13/2014   TSH 1.49 08/08/2006    Therapeutic Level Labs: No results found for: LITHIUM No results found for: VALPROATE No components found for:  CBMZ  Current Medications: Current Outpatient Medications  Medication Sig Dispense Refill  . Brexpiprazole (REXULTI PO) Take 0.5 mg by mouth daily.    . calcium-vitamin D (OSCAL WITH D) 500-200 MG-UNIT per tablet Take 1 tablet by mouth 2 (two) times daily. For bone health (Patient not taking: Reported on 10/24/2017) 30 tablet 0  . DULoxetine (CYMBALTA) 60 MG capsule Take 60 mg by mouth daily.    . hydrOXYzine (ATARAX/VISTARIL) 25 MG tablet Take 1 tablet (25 mg total) by mouth every 6 (six) hours as needed for anxiety. (Patient not taking: Reported on 10/24/2017) 45 tablet 0  . LORazepam (ATIVAN) 1 MG tablet Take 1 tablet (1 mg total) by mouth every 6 (six) hours as needed for anxiety. 16 tablet 0  . perphenazine (TRILAFON) 4 MG tablet Take 1 tablet (4 mg total) by mouth at bedtime. Mood control (Patient not taking: Reported on 10/24/2017) 30 tablet 0  . traZODone (DESYREL) 50 MG tablet Take 1 tablet (50 mg total) by mouth at bedtime. For sleep (Patient not taking: Reported on 10/24/2017) 30 tablet 0  . Vortioxetine HBr (BRINTELLIX) 10 MG TABS Take 30 mg by mouth daily.     No current facility-administered medications for this visit.      Musculoskeletal: Strength & Muscle Tone: within normal limits Gait & Station: normal Patient leans: N/A  Psychiatric Specialty Exam: ROS  Last menstrual period 05/14/2014.There is no height or weight on file to  calculate BMI.  General Appearance: Casual  Eye Contact:  Good  Speech:  Clear and Coherent  Volume:  Normal  Mood:  improving   Affect:  Congruent  Thought Process:  Coherent  Orientation:  Full (Time, Place, and Person)  Thought Content: Hallucinations: None   Suicidal Thoughts:  No  Homicidal Thoughts:  No  Memory:  Immediate;   Fair Recent;   Fair Remote;   Fair  Judgement:  Fair  Insight:  Present  Psychomotor Activity:  Normal  Concentration:  Concentration: Fair  Recall:  [employer] of Knowledge: Fair  Language: Good  Akathisia:  No  Handed:  Right  AIMS (if indicated):  Assets:  Communication Skills Desire for Improvement Resilience Social Support  ADL's:  Intact  Cognition: WNL  Sleep:  Good   Screenings: AUDIT     Admission (Discharged) from 05/12/2014 in [facility]  Alcohol Use Disorder Identification Test Final Score (AUDIT)  0    GAD-7     Counselor from 10/23/2017 in [facility]  Total GAD-7 Score  17    PHQ2-9     Counselor from 10/23/2017 in [facility]  PHQ-2 Total Score  6  PHQ-9 Total Score  25       Assessment and Plan:   [facility]  MDD:              Continue Cymbalta 60 mg PO QHS              Continue Rexulti 2 mg as prescribed by Psychiatrist   Anxitey:             Continue Ativan 1 mg  PO PRN   Treatment Plan reviewed and agreed upon by NP. TLewis and patient Toni Alvarado need for group services  Derrill Center, NP 10/30/2017, 1:07 PM

## 2017-10-30 NOTE — Therapy (Signed)
[place], [place], [address] Phone: [phone]   Fax:  [phone]  Occupational Therapy Evaluation and Treatment  Patient Details  Name: Toni Alvarado MRN: [id number] Date of Birth: [birth year] Referring Provider: Ricky Ala, NP   Encounter Date: 10/29/2017  OT End of Session - 10/30/17 0835    Visit Number  1    Number of Visits  6    Date for OT Re-Evaluation  11/22/17    Authorization Type  [place]    OT Start Time  1030    OT Stop Time  1145    OT Time Calculation (min)  75 min    Activity Tolerance  Patient tolerated treatment well    Behavior During Therapy  [facility] for tasks assessed/performed       Past Medical History:  Diagnosis Date  . Anemia   . Anxiety   . B12 deficiency   . Depression     Past Surgical History:  Procedure Laterality Date  . LITHOTRIPSY      There were no vitals filed for this visit.  Subjective Assessment - 10/29/17 1715    Currently in Pain?  No/denies        [facility] OT Assessment - 10/30/17 0835      Assessment   Medical Diagnosis  MDD    Referring Provider  Ricky Ala, NP    Onset Date/Surgical Date  -- chronic      Precautions   Precautions  None      Balance Screen   Has the patient fallen in the past 6 months  No    Has the patient had a decrease in activity level because of a fear of falling?   No    Is the patient reluctant to leave their home because of a fear of falling?   No          OT assessment:  Diagnosis: MDD Past medical history: depression, anxiety Living situation: with husband and child (separated from husband; child with medical issues) ADLs:  Independent Work: Market researcher Leisure: sleep, netflix Social support: sister-in-law, daughters Struggles: work, transitions, money OT goal:  Scientist, research (medical) Causality Orientation Scale    Subscore Percentile Score  Autonomy  96 90.51   Control  49 45.18  Impersonal  64 39.80    Motivation Type  Motivation type Explanation   Autonomy-oriented The individual is clear about what he or she is doing.  There is clear connection between behavior and interest/personal goals.  Motivation is intact.  Assessment:  Patient demonstrates autonomy-oriented motivation type.  Pt has clear connection between behavior and interest/personal goals. Patient will benefit from occupational therapy intervention in order to improve time management, financial management, stress management, job readiness skills, social skills, sleep hygiene, exercise and healthy eating habits, and health management skills and other psychosocial skills needed for preparation to return to full time community living and to be a productive community member.     Plan:  Patient will participate in skilled occupational therapy sessions individually or in a group setting to improve coping skills, psychosocial skills, and emotional skills required to return to prior level of function as a productive community member. Treatment will be 1-2 times per week for 2-6 weeks.         OT Group: Communication Skills-Assertiveness   S:  "Assertiveness means the use of "I" statements."   O:  Patient actively participated in the  following skilled occupational therapy treatment session this date:             Social and communication skills: Pt participated in group geared towards assertiveness practice and appropriate communication skills/behavior. Pt engaged in discussion on body language and what is portrayed via various gestures and postures. Pt participated in group discussion of assertiveness and traits of passive, assertive, and aggressive individuals; then participated in group activity of role playing each of the 3 aforementioned traits while trying to sell a new cell phone to an assertive/confident person. Group activity focusing on assertiveness and positive communication. Group  was divided into pairs/groups and each pair completed the Positive Communication Worksheet, coming up with a real-life scenario and working through the 7 elements of positive communication. Group then came back together for discussion of each scenario and alternative ways to handle difficult situations.    A:  Patient participated in skilled occupational therapy group for social and communication skills this date.  Patient was engaged during activities and provided insightful thoughts and feelings on session.      P:  Continue participation in skilled occupational therapy groups  1-2 times per week for 2 weeks in order to gain the necessary skills needed to return to full time community living and learn effective coping strategies to be a productive community resident. Next session: Stress management.           OT Short Term Goals - 10/30/17 0836      OT SHORT TERM GOAL #1   Title  Patient will be educated on strategies to improve psychosocial skills needed to participate fully in all daily, work, and leisure activities.    Time  3    Period  Weeks    Status  New    Target Date  11/22/17      OT SHORT TERM GOAL #2   Title  Patient will be educated on a HEP and independent with implementation of HEP.    Time  3    Period  Weeks    Status  New      OT SHORT TERM GOAL #3   Title  Patient will independently apply psychosocial skills and coping mechanisms to her daily activities in order to function independently.    Time  3    Period  Weeks    Status  New               Plan - 10/30/17 9326    Occupational performance deficits (Please refer to evaluation for details):  ADL's;IADL's;Rest and Sleep;Work;Leisure;Social Participation    Rehab Potential  Good    OT Frequency  2x / week    OT Duration  -- 3 weeks    OT Treatment/Interventions  Self-care/ADL training;Psychosocial skills training;Coping strategies training;Patient/family education;Other (comment) community  reintegration    Clinical Decision Making  Limited treatment options, no task modification necessary    Consulted and Agree with Plan of Care  Patient       Patient will benefit from skilled therapeutic intervention in order to improve the following deficits and impairments:  Decreased coping skills, Decreased psychosocial skills, Other (comment)(decreased participation in ADLs)  Visit Diagnosis: Severe episode of recurrent major depressive disorder, without psychotic features ([place]) - Plan: Ot plan of care cert/re-cert  Difficulty coping - Plan: Ot plan of care cert/re-cert  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 Severe episode of recurrent major depressive disorder ([place]) 05/13/2014  . Generalized anxiety disorder 05/13/2014  . Suicidal ideation 05/12/2014  . DYSLIPIDEMIA 10/06/2007  . OBESITY 10/06/2007  . INTERNAL HEMORRHOIDS 10/06/2007  . EXTERNAL HEMORRHOIDS 10/06/2007  . IRRITABLE BOWEL SYNDROME 10/06/2007  . RECTAL BLEEDING 10/06/2007  . MENORRHAGIA 10/06/2007  . ANEMIA, B12 DEFICIENCY 05/01/2007  . DEPRESSION 04/08/2007  . OSTEOPENIA 04/08/2007  . NEPHROLITHIASIS, HX OF 04/08/2007   Guadelupe Sabin, OTR/L  [phone] 10/30/2017, 11:50 AM  [place], [place], [address] Phone: [phone]   Fax:  [phone]  Name: Toni Alvarado MRN: [id number] Date of Birth: Jan 06, 1966

## 2017-10-31 ENCOUNTER — Other Ambulatory Visit (HOSPITAL_COMMUNITY): Payer: Self-pay

## 2017-10-31 ENCOUNTER — Other Ambulatory Visit (HOSPITAL_COMMUNITY): Payer: BC Managed Care – PPO | Admitting: Specialist

## 2017-10-31 ENCOUNTER — Ambulatory Visit (HOSPITAL_COMMUNITY): Payer: Self-pay

## 2017-10-31 ENCOUNTER — Encounter (HOSPITAL_COMMUNITY): Payer: Self-pay

## 2017-10-31 ENCOUNTER — Other Ambulatory Visit (HOSPITAL_COMMUNITY): Payer: BC Managed Care – PPO | Admitting: Licensed Clinical Social Worker

## 2017-10-31 DIAGNOSIS — F0789 Other personality and behavioral disorders due to known physiological condition: Secondary | ICD-10-CM

## 2017-10-31 DIAGNOSIS — F332 Major depressive disorder, recurrent severe without psychotic features: Secondary | ICD-10-CM

## 2017-10-31 DIAGNOSIS — R4589 Other symptoms and signs involving emotional state: Secondary | ICD-10-CM

## 2017-10-31 NOTE — Therapy (Signed)
[place], [place], [address] Phone: [phone]   Fax:  [phone]  Occupational Therapy Treatment  Patient Details  Name: Toni Alvarado MRN: [id number] Date of Birth: [birth year] Referring Provider: Ricky Ala, NP   Encounter Date: 10/31/2017  OT End of Session - 10/31/17 2152    Visit Number  2    Number of Visits  6    Date for OT Re-Evaluation  11/22/17    Authorization Type  [place]    OT Start Time  1030    OT Stop Time  1130    OT Time Calculation (min)  60 min    Activity Tolerance  Patient tolerated treatment well    Behavior During Therapy  [facility] for tasks assessed/performed       Past Medical History:  Diagnosis Date  . Anemia   . Anxiety   . B12 deficiency   . Depression     Past Surgical History:  Procedure Laterality Date  . LITHOTRIPSY      There were no vitals filed for this visit.  Subjective Assessment - 10/31/17 2151    Currently in Pain?  No/denies      S:  Good stress can motivate you O:  Skilled OT group focused on stress management this date.  Group opened with members identifying 6 stressful situations they have encountered.  Group weighed in on their personal level of stress with each situation.  Group discussed what causes varying levels of stress for the same situation.  Group discussed  recurrent physical and psychological stress can diminish self-esteem, decrease interpersonal and academic effectiveness and create a cycle of self-blame and self-doubt.  Group was educated on importance of identifying symptoms of stress, events are not stressful but rather our interpretations and reactions to event that are stressful.  Group member took a stress symptom checklist and discussed results.  Group discussed poor coping mechanisms including self medication, suppression, passivity, acting out, blaming/complaining), why it may work short term, as well as possible long  term complications.  Group discussed and learned positive coping mechanisms to stress including: relaxation, positive mental attitude, support, self-care and lifestyle changes, interpersonal strategies, emotional strategies, cognitive strategies, and philosophical strategies.  Group member was given handouts with 150 suggestions to decrease and manage stress, and member was able to voice 1 new strategy they will implement to manage stress.  A:   Patient engaged throughout group this date and open to new strategies suggested by group leader.  Patient committed to stopping smoking in order to decrease her stress level. P:  Continue skilled OT group 2 times per week.  Next group to focus on time management. Vangie Bicker, [place], OTR/L [phone]                       OT Education - 10/31/17 2151    Education provided  Yes    Education Details  educated on stress management strategies and stress precursors    Person(s) Educated  Patient    Methods  Explanation;Handout    Comprehension  Verbalized understanding;Returned demonstration       OT Short Term Goals - 10/31/17 2154      OT SHORT TERM GOAL #1   Title  Patient will be educated on strategies to improve psychosocial skills needed to participate fully in all daily, work, and leisure activities.    Time  3    Period  Weeks  Status  On-going      OT SHORT TERM GOAL #2   Title  Patient will be educated on a HEP and independent with implementation of HEP.    Time  3    Period  Weeks    Status  On-going      OT SHORT TERM GOAL #3   Title  Patient will independently apply psychosocial skills and coping mechanisms to her daily activities in order to function independently.    Time  3    Period  Weeks    Status  On-going               Plan - 10/31/17 2153    OT Treatment/Interventions  Self-care/ADL training;Psychosocial skills training;Coping strategies training;Patient/family education;Other (comment)  community reintegration       Patient will benefit from skilled therapeutic intervention in order to improve the following deficits and impairments:  Decreased coping skills, Decreased psychosocial skills, Other (comment)(decreased participation in ADLs)  Visit Diagnosis: Severe episode of recurrent major depressive disorder, without psychotic features ([place])  Difficulty coping  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 Severe episode of recurrent major depressive disorder ([place]) 05/13/2014  . Generalized anxiety disorder 05/13/2014  . Suicidal ideation 05/12/2014  . DYSLIPIDEMIA 10/06/2007  . OBESITY 10/06/2007  . INTERNAL HEMORRHOIDS 10/06/2007  . EXTERNAL HEMORRHOIDS 10/06/2007  . IRRITABLE BOWEL SYNDROME 10/06/2007  . RECTAL BLEEDING 10/06/2007  . MENORRHAGIA 10/06/2007  . ANEMIA, B12 DEFICIENCY 05/01/2007  . DEPRESSION 04/08/2007  . OSTEOPENIA 04/08/2007  . NEPHROLITHIASIS, HX OF 04/08/2007    Vangie Bicker, [place], OTR/L [phone]  10/31/2017, 9:54 PM  [place], [place], [address] Phone: [phone]   Fax:  [phone]  Name: Toni Alvarado MRN: [id number] Date of Birth: 08/06/1966

## 2017-10-31 NOTE — Progress Notes (Addendum)
GROUP NOTE - spiritual care group 10/30/2017 10:50 - 12:15 ?Facilitated by Simone Curia, MDiv and Lorin Glass, CSW.    Group focused on topic of strength. ?Group members engaged in facilitated dialog - reflected on what thoughts and feelings emerge when they hear this topic.?Reflected on The topic of strength and represented what strength had been to them in their lives (images and patterns given) and what they saw as helpful in their life now - What they needed / wanted. ? Engaged in visual explorer activity to represent what they needed strength to mean for them today.    Activity drew on Adlerian and  narrative framework   Brekyn was present throughout group.  Related that she was trying to see strength in openness and asking for help / vulnerability where needed.  Initiated connection and support with other group members during conversation.

## 2017-10-31 NOTE — Psych (Signed)
[facility] BH PHP THERAPIST PROGRESS NOTE  Toni Alvarado [id number]  Session Time: 9:00 - 10:30  Participation Level: Active  Behavioral Response: CasualAlertDepressed  Type of Therapy: Group Therapy  Treatment Goals addressed: Coping  Interventions: CBT, DBT, Solution Focused, Supportive and Reframing  Summary: Clinician led check-in regarding current stressors and situation, and review of patient completed daily inventory. Clinician utilized active listening and empathetic response and validated patient emotions. Clinician facilitated processing group on pertinent issues.   Therapist Response: [name] is a 52 y.o. female who presents with depression and anxiety symptoms. Patient arrived within time allowed and reports she is feeling "better." Patient rates her mood at a 8.5 on a scale of 1-10 with 10 being great. Patient reports continued energy from the medication and feeling as if the future is more hopeful. Patient reports that she is still working on managing anxiety about work.       Session Time: 10:30 -11:30   Participation Level: Active   Behavioral Response: CasualAlertDepressed   Type of Therapy: Group Therapy, OT   Treatment Goals addressed: Coping   Interventions: Psychosocial skills training, Supportive,    Summary:  Occupational Therapy group   Therapist Response: Patient engaged in group. See OT note.           Session Time: 11:30 - 12:15   Participation Level: Active   Behavioral Response: CasualAlertDepressed   Type of Therapy: Group Therapy, Psychotherapy   Treatment Goals addressed: Coping   Interventions: CBT, Solution focused, Supportive, Reframing   Summary:  Group continued topic of distress tolerance. Cln reviewed skills discussed yesterday and group members discussed ways they put them into practice last night.    Therapist Response: Patient engaged in group. Pt states she attempted to use contribution by helping her  son.       Session Time: 12:15 - 1:00  Participation Level: Active  Behavioral Response: CasualAlertDepressed  Type of Therapy: Group Therapy, Activity Therapy  Treatment Goals addressed: Coping  Interventions: Systems analyst, Supportive  Summary:  Reflection Group: Patients encouraged to practice skills and interpersonal techniques or work on mindfulness and relaxation techniques. The importance of self-care and making skills part of a routine to increase usage were stressed   Therapist Response: Patient engaged and participated appropriately.       Session Time: 12:45- 2:00  Participation Level: Active  Behavioral Response: CasualAlertAnxious and Depressed  Type of Therapy: Group Therapy, Psychoeducation; Psychotherapy  Treatment Goals addressed: Coping  Interventions: CBT; Solution focused; Supportive; Reframing  Summary: 12:45 - 1:50: Clinician continued topic of distress tolerance skills and introduced Self Soothe skill. Group members discussed ways they would utilize self soothe in their everyday life.   1:50 -2:00 Clinician led check-out. Clinician assessed for immediate needs, medication compliance and efficacy, and safety concerns   Therapist Response: Patient engaged activity and discussion. Pt reports chocolate, cozy blankets, coffee, and flowers as ways she can practice self soothe.  At [place], patient rates her mood at a 7 on a scale of 1-10 with 10 being great. Patient reports plans of "doing some self care" for herself this afternoon.  Patient demonstrates some progress as evidenced by increased goal directed behaviors. . Patient denies SI/HI/self-harm at the end of group.       Suicidal/Homicidal: Nowithout intent/plan  Plan: Pt will continue in PHP and work towards decreasing depression and anxiety symptoms while increasing ADL's and ability to self manage symptoms.  Diagnosis: Severe episode of recurrent major depressive disorder,  without  psychotic features ([place]) [F33.2]    1. Severe episode of recurrent major depressive disorder, without psychotic features (Toni Alvarado)       Toni Glass, LCSW 10/31/2017

## 2017-11-01 ENCOUNTER — Other Ambulatory Visit (HOSPITAL_COMMUNITY): Payer: Self-pay

## 2017-11-01 ENCOUNTER — Encounter (HOSPITAL_COMMUNITY): Payer: Self-pay

## 2017-11-01 ENCOUNTER — Other Ambulatory Visit (HOSPITAL_COMMUNITY): Payer: BC Managed Care – PPO | Admitting: Licensed Clinical Social Worker

## 2017-11-01 VITALS — BP 100/64 | HR 100 | Ht 63.0 in | Wt 198.0 lb

## 2017-11-01 DIAGNOSIS — F332 Major depressive disorder, recurrent severe without psychotic features: Secondary | ICD-10-CM | POA: Diagnosis not present

## 2017-11-01 NOTE — Progress Notes (Signed)
Patient presents with brighter affect, more level mood and denies any suicidal or homicidal ideations.  Patient reported she if doing much better, is setting limits and really using skills she has learned in PHP to better manage stress and period with flashback memories come up from previous trauma.  Patient reported she is now setting better limits with her son who suffers from Candor and her ex-husband she moved back in with to help son.  States she informed them she will move back out in 6 months and is encouraging them both to move forward and to figure out what son will be doing next, school, work, Social research officer, government.  Patient reports her boyfriend is a great support for her and helps her with setting limits.  States the Rexulti she is taking causes somnolence and insomnia so is still "playing around with when I take it each day".  Patient reported she is worried about loosing her job if she stays in [place] until 12/02/17 so is thinking she may end PHP after another week but will decide this in the coming week.  Patient more talkative and reports no problems with sleep or appetite. States she would never hurt herself or others due to her faith and love for family.  Patient will continue to work on skills learned while in [facility] as reports she is practicing their use often so they become more like second nature when back in her work and life environments.  Patient reported no other concerns today and will contact this nurse or PHP staff if any worsening of symptoms or problems with medications.

## 2017-11-01 NOTE — Psych (Signed)
[facility] BH PHP THERAPIST PROGRESS NOTE  Oasis Goehring [id number]  Session Time: 9:00 - 10:30  Participation Level: Active  Behavioral Response: CasualAlertDepressed  Type of Therapy: Group Therapy  Treatment Goals addressed: Coping  Interventions: CBT, DBT, Solution Focused, Supportive and Reframing  Summary: Clinician led check-in regarding current stressors and situation, and review of patient completed daily inventory. Clinician utilized active listening and empathetic response and validated patient emotions. Clinician facilitated processing group on pertinent issues.   Therapist Response: Trecia Maring is a 52 y.o. female who presents with depression and anxiety symptoms. Patient arrived within time allowed and reports she is feeling "clear." Patient rates her mood at a 8 on a scale of 1-10 with 10 being great. Patient reports she talked to her mom and had a "healing" conversation. Pt reports she has been working on increasing her positive self talk. Patient reports that she is still working on managing anxiety about having tough discussions.       Session Time: 10:30 -11:30   Participation Level: Active   Behavioral Response: CasualAlertDepressed   Type of Therapy: Group Therapy, OT   Treatment Goals addressed: Coping   Interventions: Psychosocial skills training, Supportive,    Summary:  Occupational Therapy group   Therapist Response: Patient engaged in group. See OT note.           Session Time: 11:30 - 12:15   Participation Level: Active   Behavioral Response: CasualAlertDepressed   Type of Therapy: Group Therapy, Psychotherapy; Psychoeducation   Treatment Goals addressed: Coping   Interventions: CBT, Solution focused, Supportive, Reframing   Summary: Cln introduced topic of communication. Cln discussed 3 components of communication: nonverbals, words, and listening. Group discussed nonverbals: what they are and how they affect communication.     Therapist Response: Patient engaged in group. Pt was able to give examples of nonverbal and demonstrated awareness of how her nonverbals are taken.        Session Time: 12:15 - 1:00  Participation Level: Active  Behavioral Response: CasualAlertDepressed  Type of Therapy: Group Therapy, Activity Therapy  Treatment Goals addressed: Coping  Interventions: Systems analyst, Supportive  Summary:  Reflection Group: Patients encouraged to practice skills and interpersonal techniques or work on mindfulness and relaxation techniques. The importance of self-care and making skills part of a routine to increase usage were stressed   Therapist Response: Patient engaged and participated appropriately.       Session Time: 12:45- 2:00  Participation Level: Active  Behavioral Response: CasualAlertDepressed  Type of Therapy: Group Therapy, Psychoeducation; Psychotherapy  Treatment Goals addressed: Coping  Interventions: CBT; Solution focused; Supportive; Reframing  Summary: 12:45 - 1:50: Clinician continued topic of communication. Cln educated group on 4 communication styles: assertive, aggressive, passive, and passive-aggressive. Group gave examples of the 4 styles and identified which is their default style.  1:50 -2:00 Clinician led check-out. Clinician assessed for immediate needs, medication compliance and efficacy, and safety concerns   Therapist Response: Patient engaged activity and discussion. Pt reports default communication style of passive or agressive. Pt reports understanding of the communication styles and how they set tone for interpersonal communication.  At [place], patient rates her mood at a 6 on a scale of 1-10 with 10 being great. Patient reports plans of going shopping and working on her to do list. Patient demonstrates some progress as evidenced by reporting skills she is using at home. Patient denies SI/HI/self-harm at the end of  group.      Suicidal/Homicidal: Nowithout intent/plan  Plan: Pt will continue in PHP and work towards decreasing depression and anxiety symptoms while increasing ADL's and ability to self manage symptoms.  Diagnosis: Severe episode of recurrent major depressive disorder, without psychotic features ([place]) [F33.2]    1. Severe episode of recurrent major depressive disorder, without psychotic features ([place])       Lorin Glass, LCSW 11/01/2017

## 2017-11-01 NOTE — Psych (Signed)
   [facility] BH PHP THERAPIST PROGRESS NOTE  Toni Alvarado [id number]  Session Time: 9:00 - 10:45  Participation Level: Active  Behavioral Response: CasualAlertDepressed  Type of Therapy: Group Therapy  Treatment Goals addressed: Coping  Interventions: CBT, DBT, Solution Focused, Supportive and Reframing  Summary: Clinician led check-in regarding current stressors and situation, and review of patient completed daily inventory. Clinician utilized active listening and empathetic response and validated patient emotions. Clinician facilitated processing group on pertinent issues.   Therapist Response: Toni Alvarado is a 52 y.o. female who presents with depression and anxiety symptoms. Patient arrived within time allowed and reports that she is feeling "so hopeful."  Patient rates her mood at a 8 on a scale of 1-10 with 10 being great. Patient reports she feels she is becoming more comfortable in her own skin and that where she is is "well within my soul." Pt reports experiencing decreased sleep over the past few days but not feeling "too tired." Pt reports struggling with managing triggers when they arise.  Patient engaged in activity and discussion. Pt became tearful when topic of hopeless was brought up in processing and shares she can relate to being in that dark place, and now that she feels she is coming out of it, her "heart breaks for people in the darkness."       Session Time: 10:45 -12:15  Participation Level: Active  Behavioral Response: CasualAlertDepressed  Type of Therapy: Group Therapy, psychotherapy  Treatment Goals addressed: Coping  Interventions: strengths based, reframing, Supportive,   Summary:  Spiritual Care group  Therapist Response: Patient engaged in group. See chaplain note.        Session Time: 12:15 - 1:00  Participation Level: Active  Behavioral Response: CasualAlertDepressed  Type of Therapy: Group Therapy, Activity Therapy  Treatment Goals  addressed: Coping  Interventions: Systems analyst, Supportive  Summary:  Reflection Group: Patients encouraged to practice skills and interpersonal techniques or work on mindfulness and relaxation techniques. The importance of self-care and making skills part of a routine to increase usage were stressed   Therapist Response: Patient engaged and participated appropriately.        Session Time: 1:00- 2:00  Participation Level: Active  Behavioral Response: CasualAlertAnxious and Depressed  Type of Therapy: Group Therapy, Psychoeducation; Psychotherapy  Treatment Goals addressed: Coping  Interventions: CBT; Solution focused; Supportive; Reframing  Summary: 1:00 - 1:50: Relaxation group: Cln [name] led yoga group focused on retraining the body's response to stress 1:50 -2:00 Clinician led check-out. Clinician assessed for immediate needs, medication compliance and efficacy, and safety concerns   Therapist Response: Patient engaged activity and discussion.  At [place], patient rates her mood at a 8 on a scale of 1-10 with 10 being great. Patient reports plans to work on her to-do list today. Patient demonstrates some progress as evidenced by maintaining some boundaries within group when she had a natural desire to "fix" others. Patient denies SI/HI/self-harm at the end of group.      Suicidal/Homicidal: Nowithout intent/plan  Plan: Pt will continue in PHP and work towards decreasing depression and anxiety symptoms while increasing ADL's and ability to self manage symptoms.  Diagnosis: Severe episode of recurrent major depressive disorder, without psychotic features ([place]) [F33.2]    1. Severe episode of recurrent major depressive disorder, without psychotic features ([place])       Lorin Glass, LCSW 11/01/2017

## 2017-11-04 ENCOUNTER — Other Ambulatory Visit (HOSPITAL_COMMUNITY): Payer: Self-pay

## 2017-11-04 ENCOUNTER — Other Ambulatory Visit (HOSPITAL_COMMUNITY): Payer: BC Managed Care – PPO | Admitting: Licensed Clinical Social Worker

## 2017-11-04 DIAGNOSIS — F332 Major depressive disorder, recurrent severe without psychotic features: Secondary | ICD-10-CM | POA: Diagnosis not present

## 2017-11-05 ENCOUNTER — Other Ambulatory Visit (HOSPITAL_COMMUNITY): Payer: BC Managed Care – PPO | Admitting: Licensed Clinical Social Worker

## 2017-11-05 ENCOUNTER — Other Ambulatory Visit (HOSPITAL_COMMUNITY): Payer: BC Managed Care – PPO | Admitting: Occupational Therapy

## 2017-11-05 ENCOUNTER — Encounter (HOSPITAL_COMMUNITY): Payer: Self-pay | Admitting: Occupational Therapy

## 2017-11-05 ENCOUNTER — Ambulatory Visit (HOSPITAL_COMMUNITY): Payer: Self-pay

## 2017-11-05 ENCOUNTER — Other Ambulatory Visit (HOSPITAL_COMMUNITY): Payer: Self-pay

## 2017-11-05 DIAGNOSIS — F332 Major depressive disorder, recurrent severe without psychotic features: Secondary | ICD-10-CM | POA: Diagnosis not present

## 2017-11-05 DIAGNOSIS — F0789 Other personality and behavioral disorders due to known physiological condition: Secondary | ICD-10-CM

## 2017-11-05 DIAGNOSIS — R4589 Other symptoms and signs involving emotional state: Secondary | ICD-10-CM

## 2017-11-05 NOTE — Therapy (Signed)
[facility] [place], [place], [address] Phone: [phone]   Fax:  [phone]  Occupational Therapy Treatment  Patient Details  Name: Toni Alvarado MRN: [id number] Date of Birth: May 25, 1966 Referring Provider: Ricky Ala, NP   Encounter Date: 11/05/2017  OT End of Session - 11/05/17 1215    Visit Number  3    Number of Visits  6    Date for OT Re-Evaluation  11/22/17    Authorization Type  [place]    OT Start Time  1030    OT Stop Time  1130    OT Time Calculation (min)  60 min    Activity Tolerance  Patient tolerated treatment well    Behavior During Therapy  [facility] for tasks assessed/performed       Past Medical History:  Diagnosis Date  . Anemia   . Anxiety   . B12 deficiency   . Depression     Past Surgical History:  Procedure Laterality Date  . LITHOTRIPSY      There were no vitals filed for this visit.  Subjective Assessment - 11/05/17 1215    Currently in Pain?  No/denies         [facility] OT Assessment - 11/05/17 1215      Assessment   Medical Diagnosis  MDD      Precautions   Precautions  None            OT Treatment Session: Social and Communication Skills III  S:  "Active listening is whole body listening."   O:  Patient actively participated in the following skilled occupational therapy treatment session this date:             Social and communication skills: Pt participated in discussion of social and communication skills including the importance of social skills in various settings. First group activity focusing on active listening and open communication via dialogue was completed next. Pt participated in small group/pair activity-participants given a subject to discuss. One person talks about the subject for 3 minutes while the other person practices active listening; then the listener recapped what the speaker said but did not include opinions or personal thoughts.  Pairs/groups then switch roles for the next subject. Pt then participated in activity-"Elephant in the Room" focusing on sharing a difficult or taboo subject or situation and working through a problem-solving process with a small group. Pt was asked to identify if the situation was one he/she could C, I, or A-control, influence, or accept. Pt then actively participated in discussion with group working through the 4 W's-why is this happening? What is being done about it? Who can resolve it? and When can it be resolved?   A:  Patient participated in skilled occupational therapy group for social and communication skills this date.  Patient was engaged and open to ideas and strategies introduced.     P:  Continue participation in skilled occupational therapy groups  1-2 times per week for 2 weeks in order to gain the necessary skills needed to return to full time community living and learn effective coping strategies to be a productive community resident. Next session: Time management              OT Short Term Goals - 10/31/17 2154      OT SHORT TERM GOAL #1   Title  Patient will be educated on strategies to improve psychosocial skills needed to participate fully in all daily, work,  and leisure activities.    Time  3    Period  Weeks    Status  On-going      OT SHORT TERM GOAL #2   Title  Patient will be educated on a HEP and independent with implementation of HEP.    Time  3    Period  Weeks    Status  On-going      OT SHORT TERM GOAL #3   Title  Patient will independently apply psychosocial skills and coping mechanisms to her daily activities in order to function independently.    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 Severe episode of recurrent major depressive disorder ([place]) 05/13/2014  . Generalized anxiety disorder 05/13/2014  . Suicidal ideation 05/12/2014  . DYSLIPIDEMIA 10/06/2007  . OBESITY 10/06/2007  . INTERNAL HEMORRHOIDS 10/06/2007  . EXTERNAL HEMORRHOIDS 10/06/2007  . IRRITABLE BOWEL SYNDROME 10/06/2007  . RECTAL BLEEDING 10/06/2007  . MENORRHAGIA 10/06/2007  . ANEMIA, B12 DEFICIENCY 05/01/2007  . DEPRESSION 04/08/2007  . OSTEOPENIA 04/08/2007  . NEPHROLITHIASIS, HX OF 04/08/2007   Guadelupe Sabin, OTR/L  [phone] 11/05/2017, 12:16 PM  [facility] [place], [place], [address] Phone: [phone]   Fax:  [phone]  Name: Toni Alvarado MRN: [id number] Date of Birth: 30-Apr-1966

## 2017-11-05 NOTE — Progress Notes (Signed)
BH MD/PA/NP Partial Hospitalization Progress Note  11/05/2017 2:01 PM Toni Alvarado  MRN:  [id number]   Objective: Toni Alvarado is awake, alert and oriented. Present tearful and depressed. [name] reports she had a "awful weekend". 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 reports that was a lot to handle in one week. Reports she was able to utilized some positive outlets by talking to a friend for a few hours on lastnight. States also started journling again which was helpful.  [name] reports " I feel like I am in a better place to handle all of this this time"  Continues to deny suicidal or homicidal ideations. Denies auditory or visual hallucination and does not appear to be responding to internal stimuli. Patient reports she is taken her medications as prescribed and tolerating medications well. Rates her depression 5/10 today. Reports her mood continues to improve. Support, encouragement and reassurance was provided.     Visit Diagnosis: No diagnosis found.  Past Psychiatric History:   Past Medical History:  Past Medical History:  Diagnosis Date  . Anemia   . Anxiety   . B12 deficiency   . Depression     Past Surgical History:  Procedure Laterality Date  . LITHOTRIPSY      Family Psychiatric History:  Family History:  Family History  Problem Relation Age of Onset  . Hyperlipidemia Mother   . Mental illness Mother   . Alcohol abuse Mother   . Depression Mother   . Physical abuse Mother   . Diabetes Father   . Heart disease Father   . Hyperlipidemia Father   . Stroke Father   . Mental illness Father   . Alcohol abuse Father   . Bipolar disorder Father   . Mental illness Brother   . Cancer Brother   . ADD / ADHD Brother   . Alcohol abuse Brother   . Bipolar disorder Brother   . Sexual abuse Brother   . Alcohol abuse Maternal Grandfather   . Alcohol abuse Paternal Grandfather   . Breast cancer Sister    . Alcohol abuse Maternal Aunt   . Anxiety disorder Maternal Aunt   . Depression Maternal Aunt   . Drug abuse Maternal Aunt   . Alcohol abuse Maternal Uncle     Social History:  Social History   Socioeconomic History  . Marital status: Legally Separated    Spouse name: Not on file  . Number of children: Not on file  . Years of education: Not on file  . Highest education level: Not on file  Social Needs  . Financial resource strain: Not very hard  . Food insecurity - worry: Never true  . Food insecurity - inability: Never true  . Transportation needs - medical: No  . Transportation needs - non-medical: No  Occupational History  . Not on file  Tobacco Use  . Smoking status: Current Every Day Smoker    Packs/day: 0.50    Types: Cigarettes  . Smokeless tobacco: Never Used  . Tobacco comment: working to stop doing this again.  Was quit for 10 years  Substance and Sexual Activity  . Alcohol use: Yes    Comment: Occasional use but did drink some the previous week to help wiht anxitety per patient  report   . Drug use: No  . Sexual activity: Yes    Partners: Male    Birth control/protection: None  Other Topics Concern  .  Not on file  Social History Narrative  . Not on file    Allergies: No Known Allergies  Metabolic Disorder Labs: No results found for: HGBA1C, MPG No results found for: PROLACTIN Lab Results  Component Value Date   CHOL 293 (HH) 01/02/2007   TRIG 249 (HH) 01/02/2007   HDL 42.5 01/02/2007   CHOLHDL 6.9 CALC 01/02/2007   VLDL 50 (H) 01/02/2007   Lab Results  Component Value Date   TSH 2.180 05/13/2014   TSH 1.49 08/08/2006    Therapeutic Level Labs: No results found for: LITHIUM No results found for: VALPROATE No components found for:  CBMZ  Current Medications: Current Outpatient Medications  Medication Sig Dispense Refill  . Brexpiprazole (REXULTI PO) Take 0.5 mg by mouth daily.    . calcium-vitamin D (OSCAL WITH D) 500-200 MG-UNIT per  tablet Take 1 tablet by mouth 2 (two) times daily. For bone health (Patient not taking: Reported on 10/24/2017) 30 tablet 0  . DULoxetine (CYMBALTA) 60 MG capsule Take 60 mg by mouth daily.    . hydrOXYzine (ATARAX/VISTARIL) 25 MG tablet Take 1 tablet (25 mg total) by mouth every 6 (six) hours as needed for anxiety. (Patient not taking: Reported on 10/24/2017) 45 tablet 0  . LORazepam (ATIVAN) 1 MG tablet Take 1 tablet (1 mg total) by mouth every 6 (six) hours as needed for anxiety. 16 tablet 0  . perphenazine (TRILAFON) 4 MG tablet Take 1 tablet (4 mg total) by mouth at bedtime. Mood control (Patient not taking: Reported on 10/24/2017) 30 tablet 0  . traZODone (DESYREL) 50 MG tablet Take 1 tablet (50 mg total) by mouth at bedtime. For sleep (Patient not taking: Reported on 10/24/2017) 30 tablet 0  . Vortioxetine HBr (BRINTELLIX) 10 MG TABS Take 30 mg by mouth daily.     No current facility-administered medications for this visit.      Musculoskeletal: Strength & Muscle Tone: within normal limits Gait & Station: normal Patient leans: N/A  Psychiatric Specialty Exam: ROS  Last menstrual period 05/14/2014.There is no height or weight on file to calculate BMI.  General Appearance: Casual  Eye Contact:  Fair  Speech:  Clear and Coherent  Volume:  Normal  Mood:  Anxious and Depressed reports sadness   Affect:  Depressed  Thought Process:  Coherent  Orientation:  Full (Time, Place, and Person)  Thought Content: Rumination   Suicidal Thoughts:  No  Homicidal Thoughts:  No  Memory:  Immediate;   Fair Recent;   Fair Remote;   Fair  Judgement:  Fair  Insight:  Present  Psychomotor Activity:  Normal  Concentration:  Concentration: Fair  Recall:  [employer] of Knowledge: Fair  Language: Good  Akathisia:  No  Handed:  Right  AIMS (if indicated):   Assets:  Communication Skills Desire for Improvement Resilience Social Support  ADL's:  Intact  Cognition: WNL  Sleep:  Fair    Screenings: AUDIT     Admission (Discharged) from 05/12/2014 in [facility]  Alcohol Use Disorder Identification Test Final Score (AUDIT)  0    GAD-7     Counselor from 10/23/2017 in [facility]  Total GAD-7 Score  17    PHQ2-9     Counselor from 10/23/2017 in [facility]  PHQ-2 Total Score  6  PHQ-9 Total Score  25       Assessment and Plan:  [facility] Partial Hospitalization [facility]  MDD:  Continue Cymbalta 60 mg PO QHS  Continue Rexulti 2 mg as prescribed by Psychiatrist   Anxitey: Continue Ativan 1 mg PO PRN   Treatment Plan reviewed and agreed upon by NP. TLewis and patient Jayna Mulnix need for group services     Derrill [place], NP 11/05/2017, 2:01 PM

## 2017-11-05 NOTE — Psych (Signed)
[facility] BH PHP THERAPIST PROGRESS NOTE  Rabab Currington [id number]  Session Time: 9:00 - 10:30  Participation Level: Active  Behavioral Response: CasualAlertDepressed  Type of Therapy: Group Therapy  Treatment Goals addressed: Coping  Interventions: CBT, DBT, Solution Focused, Supportive and Reframing  Summary: Clinician led check-in regarding current stressors and situation, and review of patient completed daily inventory. Clinician utilized active listening and empathetic response and validated patient emotions. Clinician facilitated processing group on pertinent issues.   Therapist Response: Donnielle Addison is a 52 y.o. female who presents with depression and anxiety symptoms. Patient arrived within time allowed and reports she is feeling "nostalgic." Patient rates her mood at a 8 on a scale of 1-10 with 10 being great. Patient reports she had a good self care day yesterday, and went shopping for herself. Pt shares feeling sad that some group members are discharging today. Patient reports that she is still working on managing negative self talk.       Session Time: 10:30 - 11:15   Participation Level: Active   Behavioral Response: CasualAlertDepressed   Type of Therapy: Group Therapy, Psychotherapy   Treatment Goals addressed: Coping   Interventions: CBT, Solution focused, Supportive, Reframing   Summary: Cln led group on hopelessness and group worked together to reframe and conceptualize hopelessness in a healthy way.    Therapist Response: Patient engaged in group. Pt was able to share ways in which she has struggled with hopelessness and how she has reframed it in the past. Pt reports having a "trauma flashback" in group and "reliving" finding her mom injured. With guidance from clinician pt is able to ground herself and reports "it was helpful having someone talk me through how to manage." Pt shares trauma memories have been resurfacing more for her lately and shares she feels  she can utilize skills to manage them.         Session Time: 11:15 - 12:00   Participation Level: Active   Behavioral Response: CasualAlertDepressed   Type of Therapy: Group Therapy, Psychotherapy; Psychoeducation   Treatment Goals addressed: Coping   Interventions: CBT, Solution focused, Supportive, Reframing   Summary: Cln continued topic of communication. Cln introduced "I" Statements and how to formulate them as well as common pitfalls and how to avoid them.    Therapist Response: Patient engaged in group. Pt reports understanding of "I" Statements and successfully formulated her own in practice.         Session Time: 12:00- 1:00  Participation Level: Active  Behavioral Response: CasualAlertDepressed  Type of Therapy: Group Therapy, Psychoeducation; Psychotherapy  Treatment Goals addressed: Coping  Interventions: CBT; Solution focused; Supportive; Reframing  Summary: 12:00 - 12:50: Clinician led assertiveness workshop in which group members shared issues with assertiveness and other group members helped problem solve the concern and respond in an assertive way.  12:50 -1:00 Clinician led check-out. Clinician assessed for immediate needs, medication compliance and efficacy, and safety concerns   Therapist Response: Patient engaged in activity and discussion. Pt did well with assertiveness practice and shares she is better at being assertive with friends than at work.  At [place], patient rates her mood at a 10 on a scale of 1-10 with 10 being great. Patient reports plans of seeing her boyfriend and her daughter this weekend.  Patient demonstrates some progress as evidenced by planning positive self care activities and increased ability to manage her symptoms. Patient denies SI/HI/self-harm at the end of group.      Suicidal/Homicidal: Nowithout intent/plan  Plan: Pt will continue in PHP and work towards decreasing depression and anxiety symptoms while  increasing ADL's and ability to self manage symptoms.  Diagnosis: Severe episode of recurrent major depressive disorder, without psychotic features ([place]) [F33.2]    1. Severe episode of recurrent major depressive disorder, without psychotic features ([place])       Lorin Glass, LCSW 11/05/2017

## 2017-11-05 NOTE — Psych (Signed)
[facility] BH PHP THERAPIST PROGRESS NOTE  Gera Inboden [id number]  Session Time: 9:00 - 10:30  Participation Level: Active  Behavioral Response: CasualAlertDepressed  Type of Therapy: Group Therapy  Treatment Goals addressed: Coping  Interventions: CBT, DBT, Solution Focused, Supportive and Reframing  Summary: Clinician led check-in regarding current stressors and situation, and review of patient completed daily inventory. Clinician utilized active listening and empathetic response and validated patient emotions. Clinician facilitated processing group on pertinent issues.   Therapist Response: Gearldine Looney is a 51 y.o. female who presents with depression and anxiety symptoms. Patient arrived within time allowed and reports she is feeling "conflicted." Patient rates her mood at a 6.5 on a scale of 1-10 with 10 being great. Patient reports "parts of the weekend were wonderful" including seeing her boyfriend and visiting her daughter. Pt shares she was also given news that a students' mom died and a former student died of the flu. Pt reports struggling with managing the feelings from this news. Pt processed during group. Patient reports that she is still working on not taking on other people's feelings.       Session Time: 10:30 -11:30  Participation Level: Active  Behavioral Response: CasualAlertDepressed  Type of Therapy: Group Therapy, psychoeducation, psychotherapy  Treatment Goals addressed: Coping  Interventions: CBT, DBT, Solution Focused, Supportive and Reframing  Summary:  Clinician introduced topic of feelings and emotions. Group viewed feeling faces handout and identified which three emotions they feel right now and which they want to feel. Clinician discussed the way to contextualize feelings as things that come and go and the ability to choose which feelings we attach to. Cln used metaphor of leaves in the wind.    Therapist Response: Patient participated and shared the  three feelings she wants to feel more of are: "confident, relieved, sure." Pt reports understanding of feelings and their purpose.     Session Time: 11:30 - 12:15   Participation Level: Active   Behavioral Response: CasualAlertDepressed   Type of Therapy: Group Therapy, Psychotherapy   Treatment Goals addressed: Coping   Interventions: CBT, Solution focused, Supportive, Reframing   Summary: Cln continued topic of feelings. Cln provided education on the difference between feelings and reactions to feelings, highlighting that our reactions we can alter.    Therapist Response: Patient participated and reports understanding of the role of feelings and the differences between feelings and our reactions to feelings.        Session Time: 12:15 - 1:00  Participation Level: Active  Behavioral Response: CasualAlertDepressed  Type of Therapy: Group Therapy, Activity Therapy  Treatment Goals addressed: Coping  Interventions: Systems analyst, Supportive  Summary:  Reflection Group: Patients encouraged to practice skills and interpersonal techniques or work on mindfulness and relaxation techniques. The importance of self-care and making skills part of a routine to increase usage were stressed   Therapist Response: Patient engaged and participated appropriately.       Session Time: 12:45- 2:00  Participation Level: Active  Behavioral Response: CasualAlertDepressed  Type of Therapy: Group Therapy, Psychoeducation; Psychotherapy  Treatment Goals addressed: Coping  Interventions: CBT; Solution focused; Supportive; Reframing  Summary: 12:45 - 1:50: Clinician continued topic of feelings. Cln discussed 3 states of mind: emotion mind, reason mind, and wise mind. Pt's shared examples of when they were in each state of mind. Clinician utilized hand out "ways to actively challenge your own beliefs" and pt's discussed ways to access reason mind through facts/logic.   1:50  -2:00 Clinician led check-out.  Clinician assessed for immediate needs, medication compliance and efficacy, and safety concerns   Therapist Response: Patient engaged in activity and discussion. Pt was able to walk through examples of how to challenge beliefs and access reason mind.  At [place], patient rates her mood at a 5 on a scale of 1-10 with 10 being great. Patient reports plans of taking a nap to recharge.  Patient demonstrates some progress as evidenced by showing an increased ability to sit with and manage tough feelings. Patient denies SI/HI/self-harm at the end of group.      Suicidal/Homicidal: Nowithout intent/plan  Plan: Pt will continue in PHP and work towards decreasing depression and anxiety symptoms while increasing ADL's and ability to self manage symptoms.  Diagnosis: Severe episode of recurrent major depressive disorder, without psychotic features ([place]) [F33.2]    1. Severe episode of recurrent major depressive disorder, without psychotic features ([place])       Lorin Glass, LCSW 11/05/2017

## 2017-11-06 ENCOUNTER — Other Ambulatory Visit (HOSPITAL_COMMUNITY): Payer: Self-pay

## 2017-11-06 ENCOUNTER — Other Ambulatory Visit (HOSPITAL_COMMUNITY): Payer: BC Managed Care – PPO | Admitting: Licensed Clinical Social Worker

## 2017-11-06 DIAGNOSIS — F332 Major depressive disorder, recurrent severe without psychotic features: Secondary | ICD-10-CM | POA: Diagnosis not present

## 2017-11-06 NOTE — Psych (Signed)
[facility] BH PHP THERAPIST PROGRESS NOTE  Toni Alvarado [id number]  Session Time: 9:00 - 10:30  Participation Level: Active  Behavioral Response: CasualAlertDepressed  Type of Therapy: Group Therapy  Treatment Goals addressed: Coping  Interventions: CBT, DBT, Solution Focused, Supportive and Reframing  Summary: Clinician led check-in regarding current stressors and situation, and review of patient completed daily inventory. Clinician utilized active listening and empathetic response and validated patient emotions. Clinician facilitated processing group on pertinent issues.   Therapist Response: Mackenzey Crownover is a 52 y.o. female who presents with depression and anxiety symptoms. Patient arrived within time allowed and reports she is feeling "tired." Patient rates her mood at a 6 on a scale of 1-10 with 10 being great. Patient reports she is tired and struggled to fall asleep last night. Pt shares continued rememberances of traumatic events from childhood. Pt reports journaling. Patient reports that she is working to remember what skills she has learned now that she is in a darker place than she has been recently.     Session Time: 10:30 -11:30   Participation Level: Active   Behavioral Response: CasualAlertDepressed   Type of Therapy: Group Therapy, OT   Treatment Goals addressed: Coping   Interventions: Psychosocial skills training, Supportive,    Summary:  Occupational Therapy group   Therapist Response: Patient engaged in group. See OT note.           Session Time: 11:30 - 12:15   Participation Level: Active   Behavioral Response: CasualAlertDepressed   Type of Therapy: Group Therapy, Psychotherapy; Psychoeducation   Treatment Goals addressed: Coping   Interventions: CBT, Solution focused, Supportive, Reframing   Summary: Cln led psychotherapy group on grief and loss. Pt's shared different experiences of grief and how they manage loss in their lives.     Therapist  Response: Patient engaged in group. Pt shares this is a fresh topic for her due to losing a student over the weekend. Pt shares that she feels grief is a process and it's important to take care of yourself during that time.       Session Time: 12:15 - 1:00  Participation Level: Active  Behavioral Response: CasualAlertDepressed  Type of Therapy: Group Therapy, Activity Therapy  Treatment Goals addressed: Coping  Interventions: Systems analyst, Supportive  Summary:  Reflection Group: Patients encouraged to practice skills and interpersonal techniques or work on mindfulness and relaxation techniques. The importance of self-care and making skills part of a routine to increase usage were stressed   Therapist Response: Patient engaged and participated appropriately.       Session Time: 12:45- 2:00  Participation Level: Active  Behavioral Response: CasualAlertDepressed  Type of Therapy: Group Therapy, Psychoeducation; Psychotherapy  Treatment Goals addressed: Coping  Interventions: CBT; Solution focused; Supportive; Reframing  Summary: 12:45 - 1:50: Clinician introduced topic of "Positive Psychology." Group watched "The Happiness Advantage" TED talk and discussed how the "lens" through which they view life affects the way they feel. Pts identified a strategy they would be willing to try to change their "lens."  1:50 -2:00 Clinician led check-out. Clinician assessed for immediate needs, medication compliance and efficacy, and safety concerns   Therapist Response: Patient engaged in activity and discussion. Pt reports she is going to try 2 minutes of exercise daily to help change her lens. Pt reports she will complete the exercise in the morning. At [place], patient rates her mood at a 7 on a scale of 1-10 with 10 being great. Patient reports plans of calling  HR, going to a doctor's appointment with her boyfriend's mother for support, and walking her dog for some self-care  time. Patient demonstrates some progress as evidenced by incorporating self-care time into her daily schedule. Patient denies SI/HI/self-harm at the end of group.       Suicidal/Homicidal: Nowithout intent/plan  Plan: Pt will continue in PHP and work towards decreasing depression and anxiety symptoms while increasing ADL's and ability to self manage symptoms.  Diagnosis: Severe episode of recurrent major depressive disorder, without psychotic features ([place]) [F33.2]    1. Severe episode of recurrent major depressive disorder, without psychotic features ([place])       Lorin Glass, LCSW 11/06/2017

## 2017-11-07 ENCOUNTER — Encounter (HOSPITAL_COMMUNITY): Payer: Self-pay

## 2017-11-07 ENCOUNTER — Ambulatory Visit (HOSPITAL_COMMUNITY): Payer: Self-pay

## 2017-11-07 ENCOUNTER — Other Ambulatory Visit (HOSPITAL_COMMUNITY): Payer: Self-pay

## 2017-11-07 ENCOUNTER — Other Ambulatory Visit (HOSPITAL_COMMUNITY): Payer: BC Managed Care – PPO | Admitting: Specialist

## 2017-11-07 ENCOUNTER — Other Ambulatory Visit (HOSPITAL_COMMUNITY): Payer: BC Managed Care – PPO | Admitting: Licensed Clinical Social Worker

## 2017-11-07 VITALS — BP 116/78 | HR 93 | Ht 63.0 in | Wt 200.0 lb

## 2017-11-07 DIAGNOSIS — F332 Major depressive disorder, recurrent severe without psychotic features: Secondary | ICD-10-CM

## 2017-11-07 DIAGNOSIS — R4589 Other symptoms and signs involving emotional state: Secondary | ICD-10-CM

## 2017-11-07 DIAGNOSIS — F0789 Other personality and behavioral disorders due to known physiological condition: Secondary | ICD-10-CM

## 2017-11-07 NOTE — Psych (Signed)
[facility] BH PHP THERAPIST PROGRESS NOTE  Toni Alvarado [id number]  Session Time: 9:00 - 10:30  Participation Level: Active  Behavioral Response: CasualAlertDepressed  Type of Therapy: Group Therapy, Psychotherapy  Treatment Goals addressed: Coping  Interventions: CBT, DBT, Solution Focused, Supportive and Reframing  Summary: 9:00 - 10:30 Clinician led check-in regarding current stressors and situation, and review of patient completed daily inventory. Clinician utilized active listening and empathetic response and validated patient emotions. Clinician facilitated processing group on pertinent issues.   Therapist Response: Patient arrived within time allowed and reports that she is "maintaining." Pt rates her mood at a 7 on scale of 1-10 with 10 being great. Pt reports that today is a significant day for her. Pt shared with the group that today is the 28th anniversary of the last time she tried to commit suicide. Pt stated that she has plans of celebrating this milestone in her life tonight. Pt also shared that she had a difficult evening interacting with colleagues at work but was pleased to find out a colleague gave her some of her vacation days to use to continue treatment. Pt reports that she is still working on using her skills on a regular basis to help her mental wellness. Pt engaged in discussion.      Session Time: 10:30 -12:00   Participation Level: Active   Behavioral Response: CasualAlertDepressed   Type of Therapy: Group Therapy, Psychotherapy, Psychoeducation   Treatment Goals addressed: Coping   Interventions:  CBT, DBT, Solution Focused, Supportive and Reframing   Summary 10:30 - 12:00 Clinician introduced "Mindfulness". Clinician showed TedTalk on mindfulness and the group discussed. Group discussed "What" and "How" skills of mindfulness.   Therapist Response: Patient engaged in activity and discussion. Patient reported that she tries to practice being mindful and  has participated using mindfulness activities before. Patient shared that she is going to continue to work on this skill as it is relaxing.       Session Time: 12:00 - 12:45  Participation Level: Active  Behavioral Response: CasualAlertDepressed  Type of Therapy: Group Therapy, Activity Therapy  Treatment Goals addressed: Coping  Interventions: Systems analyst, Supportive  Summary:  Reflection Group: Patients encouraged to practice skills and interpersonal techniques or work on mindfulness and relaxation techniques. The importance of self-care and making skills part of a routine to increase usage were stressed   Therapist Response: Patient engaged and participated appropriately.     Session Time: 12:45- 2:00  Participation Level: Active  Behavioral Response: CasualAlertDepressed  Type of Therapy: Group Therapy, Psychoeducation; Psychotherapy  Treatment Goals addressed: Coping  Interventions: CBT; Solution focused; Supportive; Reframing  Summary: 12:45 - 1:50 Cln continued mindfulness discuss and led a mindfulness workshop, teaching the patients different ways to be mindful.  1:50 - 2:00 Clinician led check-out. Clinician assessed for immediate needs, medication compliance and efficacy, and safety concerns   Therapist Response: Patient engaged in activity and discussion. Pt stated that she was going to work on [employer] from now on. Pt explained that she was able to really enjoy eating when she tried this technique.  At checkout, patient engaged in activity and discussion. Patient rates her mood at an 5.5-6 on a scale of 1-10 with 10 being great at the end of group. Pt reports she will go to significant and meaningful areas around [place] and release balloons in remembrance of her continued life. Patient also plans on taking a nap and getting ready for group again tomorrow. Patient demonstrates some progress as  evidenced by increased insight and use of coping  skills. Patient denies SI/HI/self-harm at the end of group.     Suicidal/Homicidal: Nowithout intent/plan  Plan: Pt will continue in PHP and work towards decreasing depression and anxiety symptoms while increasing ADL's and ability to self manage symptoms.  Diagnosis: Severe episode of recurrent major depressive disorder, without psychotic features ([place]) [F33.2]    1. Severe episode of recurrent major depressive disorder, without psychotic features ([place])     Royetta Crochet, LPCA 11/07/2017

## 2017-11-07 NOTE — Progress Notes (Signed)
Patient presents with brighter affect, more level mood and reported her current level of depression a 2, anxiety a 2 and hopelessness a 0 on a scale of 0-10 with 0 being none and 10 the worst she could manage.  Patient reported still "playing around" with how she is taking her Rexulti as when she takes a whole pill or takes it later in the evening it makes her feel "hungover".  States medications are helping though and has decided she wants to complete PHP program as reports group skills she has learned are working and when she returns to work, she wants this to be successful.  Patient denied and suicidal or homicidal ideations an no other symptoms at this point.  Patient reported she celebrated 28 years since the one time she attempted suicide with an overdose on yesterday by releasing balloons at different significant places around town that had a ticket supporting suicide prevention attached to them.  Patient did mention she feels she may need to get her B12 labs checked as often feels tired and states this was a issue in her past.  Agreed to question if NP would like to order and to also provide patient with a few therapist she would like to check into getting some help for her son.   Patient to contact this nurse if any problems or worsening of symptoms while in PHP or through transition out.

## 2017-11-07 NOTE — Therapy (Signed)
[facility] [place], [place], [address] Phone: [phone]   Fax:  [phone]  Occupational Therapy Treatment  Patient Details  Name: Toni Alvarado MRN: [id number] Date of Birth: Oct 21, 1965 Referring Provider: Ricky Ala, NP   Encounter Date: 11/07/2017  OT End of Session - 11/07/17 2049    Visit Number  4    Number of Visits  6    Date for OT Re-Evaluation  11/22/17    Authorization Type  [place]    OT Start Time  1030    OT Stop Time  1130    OT Time Calculation (min)  60 min    Activity Tolerance  Patient tolerated treatment well    Behavior During Therapy  [facility] for tasks assessed/performed       Past Medical History:  Diagnosis Date  . Anemia   . Anxiety   . B12 deficiency   . Depression     Past Surgical History:  Procedure Laterality Date  . LITHOTRIPSY      There were no vitals filed for this visit.  S:  I have good intentions, I just need to try and stay focused.   O  :Group opened with members defining accountability.  Leader educated group on definition of personal accountability, each individual being responsible for their own actions and holding themselves to this expected standard.  Personal accountability is doing what you know you should do, when you should it.  Group discussed if accountability was positive on negative theme for themselves.  Group was educated on three categories of accountability:  actions and choices, responsibilities, and goals.  Group provided examples of each accountability and identified the type that they most struggle with.  Leader educated group on use of prioritized to do list each day, labeling items with a if they absolutely must be done, b if they better be done, c if they could be done, and d if the task could be delegated.  Group then played the blame game, picking blame statements to read aloud and explain how they resononate with them.  Member was  then instructed to discuss how the statement could be changed to be more accountable frame of thinking vs blaming.  Group closed with discussion on why accountability can be difficult and what strategies they are willing to use to stay accountable to themselves.  A:  Patient was engaged in group.  Patient acknowledged accountability is important and very difficult to maintain.  Patient agrees that a to do list will be helpful in maintaining healthy level of accountability. P:  Continue skilled OT group intervention for improved ability to reintegrated into community living.  Group focus self esteem.  Vangie Bicker, [place], OTR/L [phone]                      OT Education - 11/07/17 2048    Education provided  Yes    Education Details  educated on personal accountability and use of a prioritized to do list    Person(s) Educated  Patient    Methods  Explanation    Comprehension  Verbalized understanding       OT Short Term Goals - 10/31/17 2154      OT SHORT TERM GOAL #1   Title  Patient will be educated on strategies to improve psychosocial skills needed to participate fully in all daily, work, and leisure activities.    Time  3  Period  Weeks    Status  On-going      OT SHORT TERM GOAL #2   Title  Patient will be educated on a HEP and independent with implementation of HEP.    Time  3    Period  Weeks    Status  On-going      OT SHORT TERM GOAL #3   Title  Patient will independently apply psychosocial skills and coping mechanisms to her daily activities in order to function independently.    Time  3    Period  Weeks    Status  On-going               Plan - 11/07/17 2049    OT Treatment/Interventions  Self-care/ADL training;Psychosocial skills training;Coping strategies training;Patient/family education;Other (comment) ADL training       Patient will benefit from skilled therapeutic intervention in order to improve the following deficits and  impairments:  Decreased coping skills, Decreased psychosocial skills, Other (comment)(decreased participation in ADLs)  Visit Diagnosis: Severe episode of recurrent major depressive disorder, without psychotic features ([place])  Difficulty coping  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 Severe episode of recurrent major depressive disorder ([place]) 05/13/2014  . Generalized anxiety disorder 05/13/2014  . Suicidal ideation 05/12/2014  . DYSLIPIDEMIA 10/06/2007  . OBESITY 10/06/2007  . INTERNAL HEMORRHOIDS 10/06/2007  . EXTERNAL HEMORRHOIDS 10/06/2007  . IRRITABLE BOWEL SYNDROME 10/06/2007  . RECTAL BLEEDING 10/06/2007  . MENORRHAGIA 10/06/2007  . ANEMIA, B12 DEFICIENCY 05/01/2007  . DEPRESSION 04/08/2007  . OSTEOPENIA 04/08/2007  . NEPHROLITHIASIS, HX OF 04/08/2007    Penelope Galas Burns 11/07/2017, 8:51 PM  [facility] PARTIAL HOSPITALIZATION PROGRAM [place], [place], [address] Phone: [phone]   Fax:  [phone]  Name: Toni Alvarado MRN: [id number] Date of Birth: 1966/05/01

## 2017-11-08 ENCOUNTER — Other Ambulatory Visit (HOSPITAL_COMMUNITY): Payer: BC Managed Care – PPO | Admitting: Professional

## 2017-11-08 ENCOUNTER — Other Ambulatory Visit (HOSPITAL_COMMUNITY): Payer: Self-pay

## 2017-11-08 DIAGNOSIS — F332 Major depressive disorder, recurrent severe without psychotic features: Secondary | ICD-10-CM | POA: Diagnosis not present

## 2017-11-08 NOTE — Psych (Cosign Needed)
  [facility] Partial Hospitalization Program Psych Discharge Summary  Belvie Iribe [id number]  Admission date:  Discharge date: 11/08/2017  Reason for admission: Depression  Noted per admission assessment: HPI-Lakechia Murakami 52 y.o female. Present with a flat and blunted affect. Reported history of ongoing depression and anxiety. Reports multiple work and life stressors which is causing her to have passive thoughts of death. States she recently had to move back in with her ex husband to care for her son who has been chronically ill for the past 10 years. Reports she has two adult children 35 year old 45.year old. States must of her disagreement with her husband is over her son. 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 States she likes the increase in her salary, however feels it's to much responsibility without adequate support. Denaja reports pervious inpatient admissions that she felt wasn't helpful and was "almost traumatizing". States she has attended Intensive Outpatient programs in the past and found that the class was helpful for her depression and she has a good support system. Reports she has been followed by Psychiatrist and Therapist and feels like she just gets overwhelmed with life. Reports she was recently started on Ativan for her anxiety attacks. Patient is currently denying suicidal or homicidal ideations during this assessment. Support, encouragement and reassurance was provided   Progress in Program Toward Treatment Goals: Parminder attended  and participated with group sessions.   Progress (rationale): Ongoing   Discharge Plan: Step down to IOP   Derrill Center, NP 11/08/2017

## 2017-11-08 NOTE — Psych (Signed)
[facility] BH PHP THERAPIST PROGRESS NOTE  Toni Alvarado [id number]  Session Time: 9:00 - 10:30  Participation Level: Active  Behavioral Response: CasualAlertDepressed  Type of Therapy: Group Therapy, Psychotherapy  Treatment Goals addressed: Coping  Interventions: CBT, DBT, Solution Focused, Supportive and Reframing  Summary: 9:00 - 10:30 Clinician led check-in regarding current stressors and situation, and review of patient completed daily inventory. Clinician utilized active listening and empathetic response and validated patient emotions. Clinician facilitated processing group on pertinent issues.   Therapist Response: Patient arrived within time allowed and reports that she is feeling "a little anxious." Patient rates her mood at a 6 on a scale of 1-10 with 10 being great. Pt reports that she had a good evening and was able to get her errands done. Pt reports she is still working on lessening her anxiety and preparing herself for going back to work. Patient engaged in discussion       Session Time: 10:30 -11:30   Participation Level: Active   Behavioral Response: CasualAlertDepressed   Type of Therapy: Group Therapy, OT   Treatment Goals addressed: Coping   Interventions: Psychosocial skills training, Supportive    Summary:  Occupational Therapy group   Therapist Response: Patient engaged in group. See OT note.           Session Time: 11:30 - 12:15   Participation Level: Active   Behavioral Response: CasualAlertDepressed   Type of Therapy: Group Therapy, Psychotherapy; Psychoeducation   Treatment Goals addressed: Coping   Interventions: CBT, Solution focused, Supportive, Reframing   Summary: Cln led psychotherapy group on relationships and discussed ways to improve them.      Therapist Response: Patient engaged in activity and discussion. Pt shared the struggles she is having in current relationships.        Session Time: 12:15 -  1:00  Participation Level: Active  Behavioral Response: CasualAlertDepressed  Type of Therapy: Group Therapy, Activity Therapy  Treatment Goals addressed: Coping  Interventions: Systems analyst, Supportive  Summary:  Reflection Group: Patients encouraged to practice skills and interpersonal techniques or work on mindfulness and relaxation techniques. The importance of self-care and making skills part of a routine to increase usage were stressed   Therapist Response: Patient engaged and participated.       Session Time: 12:45- 2:00  Participation Level: Active  Behavioral Response: CasualAlertDepressed  Type of Therapy: Group Therapy, Psychoeducation; Psychotherapy  Treatment Goals addressed: Coping  Interventions: CBT; Solution focused; Supportive; Reframing  Summary: 12:45 - 1:50: Cln led art therapy group on self esteem. Patients used creativity to create what self-esteem looks like for them now and what they want self-esteem to look like in the future.  1:50 -2:00 Clinician led check-out. Clinician assessed for immediate needs, medication compliance and efficacy, and safety concerns   Therapist Response: Patient engaged in activity and discussion. Pt says that right now her self-esteem is "not complete" and it is weighing down what her future is going to look like.  At [place], patient rates her mood at a 8 on a scale of 1-10 with 10 being great. Pt reports she is going to get a haircut with her son after group and go to the [facility] to get her sons tag for his car. Patient demonstrates some progress as evidenced by utilizing skills taught in group to help with her anxiety and depression. Patient denies SI/HI/self-harm thoughts at the end of group     Suicidal/Homicidal: Nowithout intent/plan  Plan: Pt will continue in PHP and  work towards decreasing depression and anxiety symptoms while increasing ADL's and ability to self manage symptoms.  Diagnosis: Severe  episode of recurrent major depressive disorder, without psychotic features ([place]) [F33.2]    1. Severe episode of recurrent major depressive disorder, without psychotic features ([place])     Lorin Glass, LCSW 11/08/2017

## 2017-11-09 ENCOUNTER — Encounter (HOSPITAL_COMMUNITY): Payer: Self-pay | Admitting: Family

## 2017-11-11 ENCOUNTER — Encounter (HOSPITAL_COMMUNITY): Payer: Self-pay | Admitting: Psychiatry

## 2017-11-11 ENCOUNTER — Other Ambulatory Visit (HOSPITAL_COMMUNITY): Payer: Self-pay

## 2017-11-11 ENCOUNTER — Other Ambulatory Visit (HOSPITAL_COMMUNITY): Payer: BC Managed Care – PPO | Admitting: Psychiatry

## 2017-11-11 DIAGNOSIS — F332 Major depressive disorder, recurrent severe without psychotic features: Secondary | ICD-10-CM

## 2017-11-11 NOTE — Psych (Signed)
[facility] BH PHP THERAPIST PROGRESS NOTE  Toni Alvarado [id number]  Session Time: 9:00 - 10:30  Participation Level: Active  Behavioral Response: CasualAlertEuthymic  Type of Therapy: Group Therapy, Psychotherapy  Treatment Goals addressed: Coping  Interventions: CBT, DBT, Solution Focused, Supportive and Reframing  Summary: 9:00 - 10:30 Clinician led check-in regarding current stressors and situation, and review of patient completed daily inventory. Clinician utilized active listening and empathetic response and validated patient emotions. Clinician facilitated processing group on pertinent issues.   Therapist Response: Patient arrived within time allowed and reports that she is feeling "excited because tomorrow is my son's birthday." Patient rates her mood at a 9 on a scale of 1-10 with 10 being great. Pt reports that she had a busy evening preparing for her son's birthday and was able to get his car ready. Pt shared that she is sad to leave group and is hopefully about her discharge plans. Pt reports she is still working on handling her anxiety and knowing which skills were work best for her. Patient engaged in activity and discussion.      Session Time: 10:30 -11:15 ? Participation Level:?Active ? Behavioral Response:?CasualAlertDepressed ? Type of Therapy: Group Therapy, psychotherapy ? Treatment Goals addressed: Coping ? Interventions:?CBT, supportive, reframing ? Summary:?Cln continued topic of self esteem. Group worked on Environmental manager and discussed barriers to recognizing what they do well.  ? Therapist Response: Patient engaged in activity and discussion. Patient reported that she has poor self-esteem and finds herself not confident in her ability to do certain task. Pt also states that she can become fearful and judgmental when wanting to try something new and failing.   ? ? ? ? Session Time: 11:15 - 12:00 ? Participation  Level:Active ? Behavioral Response:CasualAlertDepressed ? Type of Therapy: Group Therapy, Psychotherapy; Psychoeducation ? Treatment Goals addressed: Coping ? Interventions:?DBT, Solution focused, Supportive, Reframing ? Summary: Cln continued topic of self esteem. Cln educated group on the FAST skill as a means to bolster self esteem. Group discussed difficulties they foresee using the skill. ? Therapist Response: Patient engaged in group. Pt reports understanding of the FAST skill and how to use it.  ?  ? ? Session Time: 12:00- 1:00 ? Participation Level: Active ? Behavioral Response: CasualAlertDepressed ? Type of Therapy: Group Therapy, Psychoeducation; Psychotherapy ? Treatment Goals addressed: Coping ? Interventions: DBT, CBT; Solution focused; Supportive; Reframing ? Summary: 12:00 - 12:50: Group viewed "The Person You Really Need to Marry" TED talk and discussed why it is difficult to love ourselves even while we love others.  12:50 -1:00 Clinician led check-out. Clinician assessed for immediate needs, medication compliance and efficacy, and safety concerns   Therapist Response: Patient engaged in activity and discussion. Patient identified her struggles with self-love and beginning to see that she has to go through struggles in order to gain hope and self-love. At checkout, patient engaged in activity and discussion. Patient rates her mood at an 7 on a scale of 1-10 with 10 being great at the end of group. Patient reports that she is going to spend the weekend celebrating her son's birthday as well as her brothers-in-law birthday. Patient demonstrates some progress as evidenced by identifying specific skills she uses when she feels anxious. Patient enjoys the deep breathing techniques. Patient denies SI/HI/self-harm at the end of group.    Suicidal/Homicidal: Nowithout intent/plan  Plan: Pt will discharge from PHP due to meeting treatment goals of decreased depression  and anxiety symptoms and increased ability to self  manage symptoms.  Progress is noted by standardized scales, self report and observation from treatment team. Pt will step down to IOP to accommodate the transition back into life and to gain further stability. Pt and provider are aligned with this plan. Pt will begin IOP within this agency on 11/11/17 at 9 am. Pt denies SI/HI/psychosis upon discharge.    Diagnosis: Severe episode of recurrent major depressive disorder, without psychotic features ([place]) [F33.2]    1. Severe episode of recurrent major depressive disorder, without psychotic features ([place])     Lorin Glass, LCSW 11/11/2017

## 2017-11-11 NOTE — Progress Notes (Signed)
Toni Alvarado is a 52 y.o., married, employed, Caucasian female, who transitioned from [facility].  States she was in [facility] for three weeks.  Pt presented to PHP due to increasing depression and anxiety. 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 Pt states her car is in a state of disrepair, her children have had healthcare issues, and she has had a number of illnesses back to back likely due to supressed immune system from stress.   Pt denies SI/HI or A/V hallucinations. Pt is well known to writer due to previous admit in IOP (Oct. 2015).  CC: previous chart for hx. Pt currently sees Dr. Clovis Pu for medication mgmt and Toni Alvarado, [facility] for therapy.  A:  Re-oriented pt to MH-IOP.  Informed Dr.Cottle and Toni Alvarado of admit.  Encouraged support groups.  R:  Pt receptive.      Carlis Abbott, RITA, M.Ed,CNA

## 2017-11-12 ENCOUNTER — Encounter (HOSPITAL_COMMUNITY): Payer: Self-pay | Admitting: Family

## 2017-11-12 ENCOUNTER — Ambulatory Visit (HOSPITAL_COMMUNITY): Payer: Self-pay

## 2017-11-12 ENCOUNTER — Other Ambulatory Visit (HOSPITAL_COMMUNITY): Payer: BC Managed Care – PPO | Admitting: Psychiatry

## 2017-11-12 ENCOUNTER — Other Ambulatory Visit (HOSPITAL_COMMUNITY): Payer: Self-pay

## 2017-11-12 DIAGNOSIS — F332 Major depressive disorder, recurrent severe without psychotic features: Secondary | ICD-10-CM | POA: Diagnosis not present

## 2017-11-12 NOTE — Progress Notes (Addendum)
Psychiatric Initial Adult Assessment   Patient Identification: Toni Alvarado MRN:  [id number] Date of Evaluation:  11/13/2017 Referral Source: PHP Chief Complaint:   Depression   Visit Diagnosis:    ICD-10-CM   1. Severe episode of recurrent major depressive disorder, without psychotic features ([facility]) F33.2     History of Present Illness:  Per admission assessment note in PHP-[facility] Domingo 52 y.o female. Present with a flat and blunted affect. Reported history of ongoing depression and anxiety. Reports multiple work and life stressors which is causing her to have passive thoughts of death. States she recently had to move back in with her ex husband to care for her son who has been chronically ill for the past 10 years. Reports she has two adult children 27 year old 30.year old. 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 States she likes the increase in her salary, however feels it's to much responsibility without adequate support.  Toni Alvarado reports pervious inpatient admissions that she felt wasn't helpful and was "almost traumatizing". States she has attended Intensive Outpatient programs in the past and found that the class was helpful for her depression and she has a good support system. Reports she has been followed by Psychiatrist and Therapist and feels like she just gets overwhelmed with life. Reports she was recently started on Ativan for her anxiety attacks. Patient is currently denying suicidal or homicidal ideations during this assessment. Support, encouragement and reassurance was provided  On Evaluation: Toni Alvarado is awake, alert and oriented. Patient was referred by Partial hospitalization for step down care. Patient reports she is feeling a lot better since her admission. Continues to ruminate  with her sons physical health and her apprehension about returning to work. Toni Alvarado reports she feel as if she is in a better place today. Discussed her medication which she feels are helping. Rates her depression 5/10 during this assessment. Denies suicidal or homicidal ideation during this assessment. Denies auditory or visual hallucination and does not appear to be responding to internal stimuli. Reports she is eating and resting "okay" at night. Patient to continue intensive outpatient program.   Associated Signs/Symptoms: Depression Symptoms:  depressed mood, difficulty concentrating, loss of energy/fatigue, (Hypo) Manic Symptoms:  Irritable Mood, Labiality of Mood, Anxiety Symptoms:  Excessive Worry, Psychotic Symptoms:  Hallucinations: None PTSD Symptoms: NA  Past Psychiatric History: See Chart   Previous Psychotropic Medications: Yes   Substance Abuse History in the last 12 months:  No.  Consequences of Substance Abuse: NA  Past Medical History:  Past Medical History:  Diagnosis Date  . Anemia   . Anxiety   . B12 deficiency   . Depression     Past Surgical History:  Procedure Laterality Date  . LITHOTRIPSY      Family Psychiatric History:   Family History:  Family History  Problem Relation Age of Onset  . Hyperlipidemia Mother   . Mental illness Mother   . Alcohol abuse Mother   . Depression Mother   . Physical abuse Mother   . Diabetes Father   . Heart disease Father   . Hyperlipidemia Father   . Stroke Father   . Mental illness Father   . Alcohol abuse Father   . Bipolar disorder Father   . Mental illness Brother   . Cancer Brother   .  ADD / ADHD Brother   . Alcohol abuse Brother   . Bipolar disorder Brother   . Sexual abuse Brother   . Alcohol abuse Maternal Grandfather   . Alcohol abuse Paternal Grandfather   . Breast cancer Sister   . Alcohol abuse Maternal Aunt   . Anxiety disorder Maternal Aunt   . Depression Maternal Aunt   . Drug abuse  Maternal Aunt   . Alcohol abuse Maternal Uncle     Social History:   Social History   Socioeconomic History  . Marital status: Legally Separated    Spouse name: None  . Number of children: None  . Years of education: None  . Highest education level: None  Social Needs  . Financial resource strain: Not very hard  . Food insecurity - worry: Never true  . Food insecurity - inability: Never true  . Transportation needs - medical: No  . Transportation needs - non-medical: No  Occupational History  . None  Tobacco Use  . Smoking status: Current Every Day Smoker    Packs/day: 0.50    Types: Cigarettes  . Smokeless tobacco: Never Used  . Tobacco comment: working to stop doing this again.  Was quit for 10 years  Substance and Sexual Activity  . Alcohol use: Yes    Comment: Occasional use but did drink some the previous week to help wiht anxitety per patient  report   . Drug use: No  . Sexual activity: Yes    Partners: Male    Birth control/protection: None  Other Topics Concern  . None  Social History Narrative  . None    Additional Social History:  Reports she is currently dating. New of 6 months.  states she has been encouraging her son to be more independent. 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  Allergies:  No Known Allergies  Metabolic Disorder Labs: No results found for: HGBA1C, MPG No results found for: PROLACTIN Lab Results  Component Value Date   CHOL 293 (HH) 01/02/2007   TRIG 249 (HH) 01/02/2007   HDL 42.5 01/02/2007   CHOLHDL 6.9 CALC 01/02/2007   VLDL 50 (H) 01/02/2007     Current Medications: Current Outpatient Medications  Medication Sig Dispense Refill  . Brexpiprazole (REXULTI PO) Take 0.5 mg by mouth daily.    . calcium-vitamin D (OSCAL WITH D) 500-200 MG-UNIT per tablet Take 1 tablet by mouth 2 (two) times daily. For bone health 30 tablet 0  . DULoxetine (CYMBALTA) 60 MG capsule Take 60 mg by mouth daily.    .  hydrOXYzine (ATARAX/VISTARIL) 25 MG tablet Take 1 tablet (25 mg total) by mouth every 6 (six) hours as needed for anxiety. 45 tablet 0  . LORazepam (ATIVAN) 1 MG tablet Take 1 tablet (1 mg total) by mouth every 6 (six) hours as needed for anxiety. 16 tablet 0  . perphenazine (TRILAFON) 4 MG tablet Take 1 tablet (4 mg total) by mouth at bedtime. Mood control 30 tablet 0  . traZODone (DESYREL) 50 MG tablet Take 1 tablet (50 mg total) by mouth at bedtime. For sleep 30 tablet 0  . Vortioxetine HBr (BRINTELLIX) 10 MG TABS Take 30 mg by mouth daily.     No current facility-administered medications for this visit.     Neurologic: Headache: No Seizure: No Paresthesias:No  Musculoskeletal: Strength & Muscle Tone: within normal limits Gait & Station: normal Patient leans: N/A  Psychiatric Specialty Exam: ROS  Last menstrual period 05/14/2014.There  is no height or weight on file to calculate BMI.  General Appearance: Casual  Eye Contact:  Good  Speech:  Clear and Coherent  Volume:  Normal  Mood:  Anxious and Depressed  Affect:  Congruent  Thought Process:  Coherent  Orientation:  Full (Time, Place, and Person)  Thought Content:  Hallucinations: None  Suicidal Thoughts:  No  Homicidal Thoughts:  No  Memory:  Immediate;   Good Recent;   Good Remote;   Fair  Judgement:  Fair  Insight:  Present  Psychomotor Activity:  Normal  Concentration:  Concentration: Fair  Recall:  Good  Fund of Knowledge:Good  Language: Good  Akathisia:  No  Handed:  Right  AIMS (if indicated):    Assets:  Communication Skills Desire for Improvement Physical Health Resilience Social Support  ADL's:  Intact  Cognition: WNL  Sleep:      Treatment Plan Summary:  Continue IOP ( Intensive outpatient program)   Will continue working on family stressors, depression and anxiety symptoms  Continue with current medications regimen as prescribed by outpatient provider  Treatment Plan reviewed and agreed  upon by NP T. Bobby Rumpf and Patient Toni Alvarado need for group services  Derrill Center, NP 3/20/20193:13 PM

## 2017-11-13 ENCOUNTER — Encounter (HOSPITAL_COMMUNITY): Payer: Self-pay | Admitting: Family

## 2017-11-13 ENCOUNTER — Other Ambulatory Visit (HOSPITAL_COMMUNITY): Payer: Self-pay

## 2017-11-13 ENCOUNTER — Other Ambulatory Visit (HOSPITAL_COMMUNITY): Payer: BC Managed Care – PPO | Admitting: Psychiatry

## 2017-11-13 DIAGNOSIS — F332 Major depressive disorder, recurrent severe without psychotic features: Secondary | ICD-10-CM

## 2017-11-13 NOTE — Progress Notes (Deleted)
    Daily Group Progress Note  Program: IOP  Group Time: 9:00-12:00 Participation Level: Active Behavioral Response: Sharing, Appropriate Type of Therapy:  Group Summary of Progress: The beginning of the group involved the pharmacist providing information about medicine, case management and modifications. The second portion of the group session focused on themes involving life circumstances beyond control which may have an affect on group members. Patient feels as though her weekend was great. Pt. enjoyed her son's birthday and time with her boyfriend over the weekend. Stated feeling on edge today due to her arthritis pain. Patient is concerned about how her medicines seem to be waking her up at night. Pt. thinks it may be due to menopause. Pt. also mentioned that her son has chronic lime disease and being out of work for the sake of group. 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 Pt. wants to generate a self-care plan and is hopeful about her future.   Nancie Neas, LPC

## 2017-11-14 ENCOUNTER — Other Ambulatory Visit (HOSPITAL_COMMUNITY): Payer: Self-pay

## 2017-11-14 ENCOUNTER — Ambulatory Visit (HOSPITAL_COMMUNITY): Payer: Self-pay

## 2017-11-14 ENCOUNTER — Other Ambulatory Visit (HOSPITAL_COMMUNITY): Payer: BC Managed Care – PPO | Admitting: Psychiatry

## 2017-11-14 DIAGNOSIS — F332 Major depressive disorder, recurrent severe without psychotic features: Secondary | ICD-10-CM | POA: Diagnosis not present

## 2017-11-14 NOTE — Progress Notes (Signed)
    Daily Group Progress Note  Program: IOP   Group Time: 9:00-12:00 Participation Level: Active Behavioral Response: Sharing, Appropriate Type of Therapy:  Group Summary of Progress: The group session focused on themes involving topics for enforcing positive reinforcement and setting personal boundaries. Group session also touched up on building more habits for expressing self-compassion along with a psychoeducation video about self-compassion. Patient spoke about her depression and anxiety. Pt. shared how she tries to separate herself from her mental illness. Patient opened up about past  experiences of loss from a past close friend and feeling ostracized by her church community, which caused her feelings of being unaccepted. 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 Pt. offered positive affirmations towards other group members throughout session. Nancie Neas, LPC
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
## 2017-11-14 NOTE — Progress Notes (Signed)
    Daily Group Progress Note  Program: IOP  Group Time: 9:00-12:00 Participation Level: Active Behavioral Response: Sharing, Appropriate Type of Therapy:  Group Summary of Progress: The beginning of the group involved the pharmacist providing information about medicine, case management and modifications. The second portion of the group session focused on themes involving life circumstances that group members may not be able to control, but may have to learn to accept. Patient feels as though her weekend was great. Pt. enjoyed her son's birthday and time with her boyfriend over the weekend. Pt. stated feeling on edge today due to her arthritis pain. Patient is concerned about how her medicines seem to be waking her up at night. Pt. thinks it may be due to menopause. Pt. discussed that her son has chronic lyme disease and the toll that taking care of him had taken on her. 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 Pt. discussed wanting to generate a self-care plan and is hopeful about her future.   Nancie Neas, LPC

## 2017-11-14 NOTE — Progress Notes (Signed)
    Daily Group Progress Note  Program: IOP  Group Time: 9:00-12:00  Participation Level: Active  Behavioral Response: Appropriate  Type of Therapy:  Group Therapy  Summary of Progress: Pt. Participated in check-in with the case manager and indicated that she was feeling very "sad" and experiencing significant body pain from arthritis. Pt. Discussed in group therapy her fears that she was developing fibromialgia and does not want to live the life that her mother lived in chronic pain. Pt. Also discussed her fears of not doing enough for her son who has been disabled by chronic lyme disease for the past twelve years. Pt. was challenged by the group therapist to identify ways that her fear is no longer serving her and to gently let it go. Pt. Participated in yoga therapy with Jan Fireman, LPC, RYT.    Nancie Neas, LPC
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
## 2017-11-15 ENCOUNTER — Other Ambulatory Visit (HOSPITAL_COMMUNITY): Payer: BC Managed Care – PPO | Admitting: Psychiatry

## 2017-11-15 ENCOUNTER — Other Ambulatory Visit (HOSPITAL_COMMUNITY): Payer: Self-pay

## 2017-11-15 DIAGNOSIS — F332 Major depressive disorder, recurrent severe without psychotic features: Secondary | ICD-10-CM

## 2017-11-18 ENCOUNTER — Other Ambulatory Visit (HOSPITAL_COMMUNITY): Payer: BC Managed Care – PPO | Admitting: Psychiatry

## 2017-11-18 ENCOUNTER — Telehealth (HOSPITAL_COMMUNITY): Payer: Self-pay | Admitting: Psychiatry

## 2017-11-18 ENCOUNTER — Other Ambulatory Visit (HOSPITAL_COMMUNITY): Payer: Self-pay

## 2017-11-18 NOTE — Progress Notes (Signed)
    Daily Group Progress Note  Program: IOP  Group Time: 9:00-12:00  Participation Level: Active  Behavioral Response: Appropriate  Type of Therapy:  Group Therapy  Summary of Progress: Pt. Presented as talkative and engaged in the group process. Pt. Shared that she was experiencing high body pain due to her arthritis. Pt. Discussed recent communication in a group chat with co-workers that had left her feeling insecure about her relationships at work. Pt. Received feedback from the group about limiting her communications with co-workers about work during her leave so that she can concentrate on her healing. Pt. Shared art with the group that represented two pictures of herself, one pre-treatment that was dark and post-treatment that was brighter and filled with themes related to hopefulness.     Nancie Neas, LPC

## 2017-11-19 ENCOUNTER — Other Ambulatory Visit (HOSPITAL_COMMUNITY): Payer: Self-pay

## 2017-11-19 ENCOUNTER — Ambulatory Visit (HOSPITAL_COMMUNITY): Payer: Self-pay

## 2017-11-19 ENCOUNTER — Other Ambulatory Visit (HOSPITAL_COMMUNITY): Payer: BC Managed Care – PPO | Admitting: Psychiatry

## 2017-11-19 DIAGNOSIS — F332 Major depressive disorder, recurrent severe without psychotic features: Secondary | ICD-10-CM

## 2017-11-20 ENCOUNTER — Other Ambulatory Visit (HOSPITAL_COMMUNITY): Payer: BC Managed Care – PPO | Admitting: Psychiatry

## 2017-11-20 ENCOUNTER — Other Ambulatory Visit (HOSPITAL_COMMUNITY): Payer: Self-pay

## 2017-11-20 DIAGNOSIS — F332 Major depressive disorder, recurrent severe without psychotic features: Secondary | ICD-10-CM | POA: Diagnosis not present
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
## 2017-11-21 ENCOUNTER — Ambulatory Visit (HOSPITAL_COMMUNITY): Payer: Self-pay

## 2017-11-21 ENCOUNTER — Other Ambulatory Visit (HOSPITAL_COMMUNITY): Payer: Self-pay

## 2017-11-21 ENCOUNTER — Other Ambulatory Visit (HOSPITAL_COMMUNITY): Payer: BC Managed Care – PPO | Admitting: Psychiatry

## 2017-11-21 DIAGNOSIS — F332 Major depressive disorder, recurrent severe without psychotic features: Secondary | ICD-10-CM | POA: Diagnosis not present

## 2017-11-22 ENCOUNTER — Other Ambulatory Visit (HOSPITAL_COMMUNITY): Payer: Self-pay

## 2017-11-22 ENCOUNTER — Other Ambulatory Visit (HOSPITAL_COMMUNITY): Payer: BC Managed Care – PPO | Admitting: Psychiatry

## 2017-11-22 DIAGNOSIS — F332 Major depressive disorder, recurrent severe without psychotic features: Secondary | ICD-10-CM

## 2017-11-22 NOTE — Progress Notes (Signed)
    Daily Group Progress Note  Program: IOP  Group Time: 9:00-12:00  Participation Level: Active  Behavioral Response: Appropriate  Type of Therapy:  Group Therapy  Summary of Progress:  Pt. Presented as talkative, engaged in the group process. Pt. Participated in grief and loss group with the Chaplain. Pt. Participated in presentation about community resources presented by the mental health association. Pt. Connected with other group members around themes of job dissatisfaction and surrendering control over life tragedies that are outside of our control.     Nancie Neas, LPC

## 2017-11-25 ENCOUNTER — Other Ambulatory Visit (HOSPITAL_COMMUNITY): Payer: Self-pay

## 2017-11-25 ENCOUNTER — Other Ambulatory Visit (HOSPITAL_COMMUNITY): Payer: BC Managed Care – PPO | Attending: Psychiatry | Admitting: Psychiatry

## 2017-11-25 DIAGNOSIS — F332 Major depressive disorder, recurrent severe without psychotic features: Secondary | ICD-10-CM | POA: Diagnosis present

## 2017-11-25 DIAGNOSIS — F329 Major depressive disorder, single episode, unspecified: Secondary | ICD-10-CM | POA: Insufficient documentation

## 2017-11-25 DIAGNOSIS — F419 Anxiety disorder, unspecified: Secondary | ICD-10-CM | POA: Insufficient documentation

## 2017-11-25 DIAGNOSIS — F322 Major depressive disorder, single episode, severe without psychotic features: Secondary | ICD-10-CM | POA: Diagnosis not present
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
## 2017-11-25 NOTE — Progress Notes (Signed)
    Daily Group Progress Note  Program: IOP  Group Time: 9:00-12:00  Participation Level: Active  Behavioral Response: Appropriate  Type of Therapy:  Group Therapy  Summary of Progress: Pt. Presented as talkative, appropriately tearful, engaged in therapy process. Pt. Participated and was attentive during reflective reading and suggestions for meditation. Pt. Shared that she felt very sad in anticipation of her cousin's funeral. Pt. Received support and feedback from the group regarding allowing herself the time to grieve her cousin and to set appropriate boundaries for self-care during the grief process.    Nancie Neas, LPC

## 2017-11-26 ENCOUNTER — Other Ambulatory Visit (HOSPITAL_COMMUNITY): Payer: Self-pay

## 2017-11-26 ENCOUNTER — Other Ambulatory Visit (HOSPITAL_COMMUNITY): Payer: BC Managed Care – PPO | Admitting: Psychiatry

## 2017-11-26 ENCOUNTER — Ambulatory Visit (HOSPITAL_COMMUNITY): Payer: Self-pay

## 2017-11-26 DIAGNOSIS — F329 Major depressive disorder, single episode, unspecified: Secondary | ICD-10-CM | POA: Diagnosis not present

## 2017-11-26 DIAGNOSIS — F332 Major depressive disorder, recurrent severe without psychotic features: Secondary | ICD-10-CM
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
## 2017-11-27 ENCOUNTER — Other Ambulatory Visit (HOSPITAL_COMMUNITY): Payer: Self-pay

## 2017-11-27 ENCOUNTER — Other Ambulatory Visit (HOSPITAL_COMMUNITY): Payer: BC Managed Care – PPO | Admitting: Psychiatry

## 2017-11-27 NOTE — Progress Notes (Signed)
    Daily Group Progress Note  Program: IOP  Group Time: 9:00-12:00 Participation Level: Active Behavioral Response: Sharing, Appropriate Type of Therapy:  Group Summary of Progress: The group session focused on themes involving topics pertaining to dealing with fearful thinking of pre-anticipated events that may or may not occur. Group also spoke about giving themselves permission to express inner emotions about external past or current events. The second half of the session dealt with topics of grief and loss. Patient expressed interest in expressing her pain through art. Pt. feels as though drawing and writing have been beneficial towards her growth and progress in her mental health. Pt. reflected about her emotional breakdown from yesterday's group. Pt. received encouragement to be assertive from her boyfriend along with feedback from the group.  Nancie Neas, LPC

## 2017-11-28 ENCOUNTER — Ambulatory Visit (HOSPITAL_COMMUNITY): Payer: Self-pay

## 2017-11-28 ENCOUNTER — Other Ambulatory Visit (HOSPITAL_COMMUNITY): Payer: Self-pay

## 2017-11-28 ENCOUNTER — Other Ambulatory Visit (HOSPITAL_COMMUNITY): Payer: BC Managed Care – PPO | Admitting: Psychiatry

## 2017-11-28 DIAGNOSIS — F332 Major depressive disorder, recurrent severe without psychotic features: Secondary | ICD-10-CM

## 2017-11-28 DIAGNOSIS — F329 Major depressive disorder, single episode, unspecified: Secondary | ICD-10-CM | POA: Diagnosis not present

## 2017-11-28 NOTE — Progress Notes (Signed)
    Daily Group Progress Note  Program: IOP  Group Time: 9:00-12:00  Participation Level: Active  Behavioral Response: Appropriate  Type of Therapy:  Group Therapy  Summary of Progress: Pt. Presented with brightened affect, talkative, engaged in the group process. Pt. discussed feeing overwhelmed by thoughts of returning to work, meeting the needs of her son, and meeting the needs of her significant other. Pt. Received feedback from the group about setting small daily goals and engaging in self-care in preparation for her return to work. Pt. Participated in yoga therapy with Jan Fireman, LPC, RYT.    Nancie Neas, LPC

## 2017-11-29 ENCOUNTER — Other Ambulatory Visit (HOSPITAL_COMMUNITY): Payer: BC Managed Care – PPO | Admitting: Psychiatry

## 2017-11-29 ENCOUNTER — Other Ambulatory Visit (HOSPITAL_COMMUNITY): Payer: Self-pay

## 2017-11-29 DIAGNOSIS — F329 Major depressive disorder, single episode, unspecified: Secondary | ICD-10-CM | POA: Diagnosis not present

## 2017-11-29 DIAGNOSIS — F332 Major depressive disorder, recurrent severe without psychotic features: Secondary | ICD-10-CM

## 2017-11-29 NOTE — Progress Notes (Signed)
Toni Alvarado is a 52 y.o. , married, employed, Caucasian female, who transitioned from [facility].  Stated she was in [facility] for three weeks.  Pt presented to PHP due to increasing depression and anxiety. 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 Pt stated her car is in a state of disrepair, her children have had healthcare issues (just found out that son has a serious mold allergy) , and she has had a number of illnesses back to back likely due to supressed immune system from stress.   "I have to get the house tested and eventually move him out of the home."  Pt completed MH-IOP today.  Reports overall mood improved.  "I have learned stress mgmt techniques, pain tolerance and I've made friends."  Pt admits to smoking cigarettes for six weeks, but hasn't smoked since Wednesday (11-27-17).  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  Pt continues to deny SI/HI or A/V hallucinations. Pt is well known to writer due to previous admit in IOP (Oct. 2015).  CC: previous chart for hx. Pt currently sees Dr. Clovis Pu for medication mgmt and Dara Hoyer, [facility] for therapy.  A:  D/C today.  Will f/u with Dr.Cottle 12-02-17 and will continue trying to obtain appt from [facility], [facility].  Encouraged support groups.  RTW on 12-02-17.  Pt to discuss accommodations with Dr. Clovis Pu.  R:  Pt receptive.           Derrill Center, NP

## 2017-11-29 NOTE — Progress Notes (Signed)
  [facility] Intensive Outpatient Program Discharge Summary  Tona Qualley [id number]  Admission date: 10/23/2017 Discharge date: 11/29/2017  Reason for admission: Per assessment notes: Nohely Whitehorn 52 y.o female. Present with a flat and blunted affect. Reported history of ongoing depression and anxiety. Reports multiple work and life stressors which is causing her to have passive thoughts of death. States she recently had to move back in with her ex husband to care for her son who has been chronically ill for the past 10 years. Reports she has two adult children 76 year old 57.year old. 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 States she likes the increase in her salary, however feels it's to much responsibility without adequate support. Mikell reports pervious inpatient admissions that she felt wasn't helpful and was "almost traumatizing". States she has attended Intensive Outpatient programs in the past and found that the class was helpful for her depression and she has a good support system. Reports she has been followed by Psychiatrist and Therapist and feels like she just gets overwhelmed with life. Reports she was recently started on Ativan for her anxiety attacks. Patient is currently denying suicidal or homicidal ideations during this assessment. Support, encouragement and reassurance was provided  Chemical Use History:  Was denied   Family of Origin Issues: Reports her relationship with her husband as improved since their discuss a few weeks back. Reports she will continue to be supportive the her son and his illness and she is working on not enabling him as much.   Progress in Program Toward Treatment Goals:  Lanell reports both group session was helpful and enjoyable. Reports she has  a wonderful experiences and has learned new coping skills. Continues to report symptoms of worries with starting back to work. Reports a new job opportunity.     Patient has a follow-up appointment with  Dr. Clovis Pu and Thearpist session with [place] (rationale): Ongoing    Take all medications as prescribed. Keep all follow-up appointments as scheduled.  Do not consume alcohol or use illegal drugs while on prescription medications. Report any adverse effects from your medications to your primary care provider promptly.  In the event of recurrent symptoms or worsening symptoms, call 911, a crisis hotline, or go to the nearest emergency department for evaluation.   Ricky Ala, NP  11/29/2017

## 2017-11-29 NOTE — Patient Instructions (Signed)
D:  Patient successfully completed MH-IOP today.  A:  Discharge patient today.  F/U with Dr. Clovis Pu on 12-02-17 @ 4:30 pm.  Patient will continue to call Windell Moment, [facility] for an appointment.  R:  Pt receptive.

## 2017-12-02 ENCOUNTER — Other Ambulatory Visit (HOSPITAL_COMMUNITY): Payer: BC Managed Care – PPO

## 2017-12-02 NOTE — Progress Notes (Signed)
    Daily Group Progress Note  Program: IOP  Group Time: 9:00-12:00  Participation Level: Active  Behavioral Response: Appropriate  Type of Therapy:  Group Therapy  Summary of Progress: Pt. Presented with bright affect, talkative, engaged in the group process. Pt. Met with the case manager to prepare of discharge. Pt. Shared her poetry and art with the group and gave a bookmark with her art to each member of the group. Pt. Shared that she is preparing herself for her return to work.    Nancie Neas, LPC

## 2017-12-03 ENCOUNTER — Other Ambulatory Visit (HOSPITAL_COMMUNITY): Payer: BC Managed Care – PPO

## 2017-12-04 ENCOUNTER — Other Ambulatory Visit (HOSPITAL_COMMUNITY): Payer: BC Managed Care – PPO

## 2017-12-05 ENCOUNTER — Other Ambulatory Visit (HOSPITAL_COMMUNITY): Payer: BC Managed Care – PPO

## 2017-12-06 ENCOUNTER — Other Ambulatory Visit (HOSPITAL_COMMUNITY): Payer: BC Managed Care – PPO | Admitting: Psychiatry

## 2017-12-09 ENCOUNTER — Other Ambulatory Visit (HOSPITAL_COMMUNITY): Payer: BC Managed Care – PPO

## 2017-12-10 ENCOUNTER — Other Ambulatory Visit (HOSPITAL_COMMUNITY): Payer: BC Managed Care – PPO

## 2017-12-11 ENCOUNTER — Other Ambulatory Visit (HOSPITAL_COMMUNITY): Payer: BC Managed Care – PPO

## 2017-12-12 ENCOUNTER — Other Ambulatory Visit (HOSPITAL_COMMUNITY): Payer: BC Managed Care – PPO

## 2017-12-13 ENCOUNTER — Other Ambulatory Visit (HOSPITAL_COMMUNITY): Payer: BC Managed Care – PPO

## 2017-12-16 ENCOUNTER — Other Ambulatory Visit (HOSPITAL_COMMUNITY): Payer: BC Managed Care – PPO

## 2017-12-17 ENCOUNTER — Other Ambulatory Visit (HOSPITAL_COMMUNITY): Payer: BC Managed Care – PPO

## 2017-12-18 ENCOUNTER — Other Ambulatory Visit (HOSPITAL_COMMUNITY): Payer: BC Managed Care – PPO

## 2017-12-19 ENCOUNTER — Other Ambulatory Visit (HOSPITAL_COMMUNITY): Payer: BC Managed Care – PPO

## 2017-12-20 ENCOUNTER — Other Ambulatory Visit (HOSPITAL_COMMUNITY): Payer: BC Managed Care – PPO

## 2017-12-23 ENCOUNTER — Other Ambulatory Visit (HOSPITAL_COMMUNITY): Payer: BC Managed Care – PPO

## 2017-12-24 ENCOUNTER — Other Ambulatory Visit (HOSPITAL_COMMUNITY): Payer: BC Managed Care – PPO

## 2017-12-25 ENCOUNTER — Other Ambulatory Visit (HOSPITAL_COMMUNITY): Payer: PRIVATE HEALTH INSURANCE

## 2017-12-26 ENCOUNTER — Other Ambulatory Visit (HOSPITAL_COMMUNITY): Payer: PRIVATE HEALTH INSURANCE

## 2017-12-27 ENCOUNTER — Other Ambulatory Visit (HOSPITAL_COMMUNITY): Payer: PRIVATE HEALTH INSURANCE

## 2017-12-30 ENCOUNTER — Other Ambulatory Visit (HOSPITAL_COMMUNITY): Payer: PRIVATE HEALTH INSURANCE

## 2017-12-31 ENCOUNTER — Other Ambulatory Visit (HOSPITAL_COMMUNITY): Payer: PRIVATE HEALTH INSURANCE

## 2018-01-01 ENCOUNTER — Other Ambulatory Visit (HOSPITAL_COMMUNITY): Payer: PRIVATE HEALTH INSURANCE

## 2018-01-02 ENCOUNTER — Other Ambulatory Visit (HOSPITAL_COMMUNITY): Payer: PRIVATE HEALTH INSURANCE

## 2018-01-03 ENCOUNTER — Other Ambulatory Visit (HOSPITAL_COMMUNITY): Payer: PRIVATE HEALTH INSURANCE

## 2018-06-06 DIAGNOSIS — F4310 Post-traumatic stress disorder, unspecified: Secondary | ICD-10-CM

## 2018-06-06 HISTORY — DX: Post-traumatic stress disorder, unspecified: F43.10

## 2018-06-16 ENCOUNTER — Ambulatory Visit: Payer: PRIVATE HEALTH INSURANCE | Admitting: Psychiatry

## 2018-09-17 ENCOUNTER — Ambulatory Visit: Payer: Self-pay | Admitting: Psychiatry

## 2018-09-17 ENCOUNTER — Encounter: Payer: Self-pay | Admitting: Psychiatry

## 2018-09-17 DIAGNOSIS — F4310 Post-traumatic stress disorder, unspecified: Secondary | ICD-10-CM

## 2018-09-17 DIAGNOSIS — F411 Generalized anxiety disorder: Secondary | ICD-10-CM

## 2018-09-17 DIAGNOSIS — F4001 Agoraphobia with panic disorder: Secondary | ICD-10-CM

## 2018-09-17 DIAGNOSIS — F341 Dysthymic disorder: Secondary | ICD-10-CM

## 2018-09-17 DIAGNOSIS — F339 Major depressive disorder, recurrent, unspecified: Secondary | ICD-10-CM

## 2018-09-17 MED ORDER — NORTRIPTYLINE HCL 25 MG PO CAPS: ORAL_CAPSULE | ORAL | 1 refills | Status: DC

## 2018-09-17 NOTE — Progress Notes (Signed)
Toni Alvarado [id number] 1966-04-14 53 y.o.  Subjective:   Patient ID:  Toni Alvarado is a 53 y.o. (DOB [birth year]) female.  Chief Complaint:  Chief Complaint  Patient presents with  . Follow-up    Medication Adjustment    HPI  Last seen Sept. Toni Alvarado presents to the office today for follow-up of chronic depression. At last visit added Rexulti but saw benefit  With energy and less panic, but had quite her job that was triggering it.  She thought it was causing sleep problems, but sleep problems continue off the [place].  Bedridden for 3weeks.  Gradually worse since here.  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  Plans Women to Work program at [employer] and peer support.  Prior psych meds:  Rexulti 0.5, Provigil,   Review of Systems:  Review of Systems  Gastrointestinal: Positive for abdominal pain. Negative for constipation.  Neurological: Negative for tremors and weakness.  Psychiatric/Behavioral: Positive for dysphoric mood and sleep disturbance. Negative for agitation, behavioral problems, confusion, decreased concentration, hallucinations, self-injury and suicidal ideas. The patient is nervous/anxious. The patient is not hyperactive.     Medications: I have reviewed the patient's current medications.  Current Outpatient Medications  Medication Sig Dispense Refill  . DULoxetine (CYMBALTA) 60 MG capsule Take 60 mg by mouth daily.    Marland Kitchen LORazepam (ATIVAN) 1 MG tablet Take 1 tablet (1 mg total) by mouth every 6 (six) hours as needed for anxiety. 16 tablet 0  . traZODone (DESYREL) 50 MG tablet Take 1 tablet (50 mg total) by mouth at bedtime. For sleep 30 tablet 0  . calcium-vitamin D (OSCAL WITH D) 500-200 MG-UNIT per tablet Take 1 tablet by mouth 2 (two) times daily. For bone health (Patient not taking: Reported on 09/17/2018) 30 tablet 0   No current facility-administered medications for this visit.     Medication Side Effects: Fatigue  Allergies: No  Known Allergies  Past Medical History:  Diagnosis Date  . Anemia   . Anxiety   . B12 deficiency   . Depression     Family History  Problem Relation Age of Onset  . Hyperlipidemia Mother   . Mental illness Mother   . Alcohol abuse Mother   . Depression Mother   . Physical abuse Mother   . Diabetes Father   . Heart disease Father   . Hyperlipidemia Father   . Stroke Father   . Mental illness Father   . Alcohol abuse Father   . Bipolar disorder Father   . Mental illness Brother   . Cancer Brother   . ADD / ADHD Brother   . Alcohol abuse Brother   . Bipolar disorder Brother   . Sexual abuse Brother   . Depression Daughter        good with Cymbalta  . Alcohol abuse Maternal Grandfather   . Alcohol abuse Paternal Grandfather   . Breast cancer Sister   . Alcohol abuse Maternal Aunt   . Anxiety disorder Maternal Aunt   . Depression Maternal Aunt   . Drug abuse Maternal Aunt   . Alcohol abuse Maternal Uncle     Social History   Socioeconomic History  . Marital status: Legally Separated    Spouse name: Not on file  . Number of children: Not on file  . Years of education: Not on file  . Highest education level: Not on file  Occupational History  . Not on file  Social Needs  . 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 Food insecurity:    Worry: Never true    Inability: Never true  . Transportation needs:    Medical: No    Non-medical: No  Tobacco Use  . Smoking status: Current Every Day Smoker    Packs/day: 0.50    Types: Cigarettes  . Smokeless tobacco: Never Used  . Tobacco comment: working to stop doing this again.  Was quit for 10 years  Substance and Sexual Activity  . Alcohol use: Yes    Comment: Occasional use but did drink some the previous week to help wiht anxitety per patient  report   . Drug use: No  . Sexual activity: Yes    Partners: Male    Birth control/protection: None  Lifestyle  . Physical activity:    Days per week: 0 days     Minutes per session: Not on file  . Stress: Very much  Relationships  . Social connections:    Talks on phone: More than three times a week    Gets together: Once a week    Attends religious service: Never    Active member of club or organization: Yes    Attends meetings of clubs or organizations: More than 4 times per year    Relationship status: Separated  . Intimate partner violence:    Fear of current or ex partner: No    Emotionally abused: No    Physically abused: No    Forced sexual activity: No  Other Topics Concern  . Not on file  Social History Narrative  . Not on file    Past Medical History, Surgical history, Social history, and Family history were reviewed and updated as appropriate.   Please see review of systems for further details on the patient's review from today.   Objective:   Physical Exam:  LMP 05/14/2014   Physical Exam Constitutional:      General: She is not in acute distress.    Appearance: She is well-developed.  Musculoskeletal:        General: No deformity.  Neurological:     Mental Status: She is alert and oriented to person, place, and time.     Motor: No tremor.     Coordination: Coordination normal.     Gait: Gait normal.  Psychiatric:        Attention and Perception: Attention and perception normal.        Mood and Affect: Mood is anxious and depressed. Affect is not labile, blunt, angry or inappropriate.        Speech: Speech normal.        Behavior: Behavior normal.        Thought Content: Thought content normal. Thought content does not include homicidal or suicidal ideation. Thought content does not include homicidal or suicidal plan.        Cognition and Memory: Cognition normal.        Judgment: Judgment normal.     Comments: Insight intact. No auditory or visual hallucinations. No delusions.      Lab Review:     Component Value Date/Time   NA 139 06/14/2014 0001   K 4.1 06/14/2014 0001   CL 103 06/14/2014 0001   CO2  24 06/14/2014 0001   GLUCOSE 116 (H) 06/14/2014 0001   BUN 13 06/14/2014 0001   CREATININE 0.62 06/14/2014 0001   CALCIUM 9.0 06/14/2014 0001   PROT 7.2 06/14/2014 0001   ALBUMIN 3.7 06/14/2014 0001   AST 18  06/14/2014 0001   ALT 18 06/14/2014 0001   ALKPHOS 82 06/14/2014 0001   BILITOT <0.2 (L) 06/14/2014 0001   GFRNONAA >90 06/14/2014 0001   GFRAA >90 06/14/2014 0001       Component Value Date/Time   WBC 8.6 06/14/2014 0001   RBC 3.92 06/14/2014 0001   HGB 11.7 (L) 06/14/2014 0001   HCT 34.9 (L) 06/14/2014 0001   PLT 280 06/14/2014 0001   MCV 89.0 06/14/2014 0001   MCH 29.8 06/14/2014 0001   MCHC 33.5 06/14/2014 0001   RDW 13.4 06/14/2014 0001   LYMPHSABS 3.2 01/24/2010 0835   MONOABS 0.7 01/24/2010 0835   EOSABS 0.3 01/24/2010 0835   BASOSABS 0.1 01/24/2010 0835    No results found for: POCLITH, LITHIUM   Lab Results  Component Value Date   CBMZ 10.7 Performed at [facility] 02/02/2007     .res Assessment: Plan:    Recurrent major depression resistant to treatment ([place])  Panic disorder with agoraphobia  Dysthymia  PTSD (post-traumatic stress disorder)  Generalized anxiety disorder   This patient has a history of chronic depression with multiple episodes and prior psychiatric hospitalizations and chronic anxiety.  She is typically had difficulty functioning well for any extended period of time.  She has sought disability a couple of occasions.  She presents today complaining of worsening depression again.  She is financially stressed which complicates treatments and limits options.  Also chronically noncompliant with follow-up.    Sleep hygiene.  Disrupted sleep cycle.  Never taken TCA nor MAOI.  Has tried enough SSRI.  Fearful of weight gain. Nortriptyline 25 to 75 HS.  Wean duloxetine.  Constipation management if needed: 1.  Loss of water 2.  Powdered fiber supplement such as MiraLAX, Citrucel, etc. preferably with a meal 3.  2 stool  softeners a day 4.  Milk of magnesia or magnesium tablets if needed  FU 6 weeks.  Lynder Parents, MD, DFAPA  Please see After Visit Summary for patient specific instructions.  No future appointments.  No orders of the defined types were placed in this encounter.     -------------------------------

## 2018-09-17 NOTE — Patient Instructions (Signed)
1 at night for 7 days, then reduce duloxetine to 30 mg daily and increase nortriptyline to 2 capsules daily for 7 days, then stop duloxetine and increase nortriptyline to 3 each night

## 2018-10-29 ENCOUNTER — Ambulatory Visit: Payer: Self-pay | Admitting: Psychiatry

## 2018-12-01 ENCOUNTER — Other Ambulatory Visit: Payer: Self-pay

## 2018-12-01 ENCOUNTER — Telehealth: Payer: Self-pay | Admitting: Psychiatry

## 2018-12-01 MED ORDER — NORTRIPTYLINE HCL 25 MG PO CAPS: ORAL_CAPSULE | ORAL | 1 refills | Status: DC

## 2018-12-01 NOTE — Telephone Encounter (Signed)
Patient need refill on Notriptyline 25 mg., to be sent to [facility] on [address]

## 2018-12-17 ENCOUNTER — Telehealth: Payer: Self-pay

## 2018-12-17 ENCOUNTER — Telehealth: Payer: Self-pay | Admitting: *Deleted

## 2018-12-17 ENCOUNTER — Telehealth: Payer: Self-pay | Admitting: Psychiatry

## 2018-12-17 NOTE — Telephone Encounter (Signed)
Noted  

## 2018-12-17 NOTE — Telephone Encounter (Signed)
Patient called and said that she is having heart issues such as heart palpations, elevated heart rate and dizzyness. She was so dizzy she fell yesterday. Her pcp stopped her norttriptylene to see if that would help. She has been off it for 3 days now. She is also going to see a cardiologist but wanted you to be aware

## 2018-12-17 NOTE — Telephone Encounter (Signed)
REFERRAL SENT TO [facility] AND NOTES ON FILE FROM [place], [place].

## 2018-12-17 NOTE — Telephone Encounter (Signed)
Left message for patient to call back  

## 2018-12-18 NOTE — Progress Notes (Signed)
Cardiology Consultation:   Patient ID: Toni Alvarado MRN: [id number]; DOB: 19-Feb-1966  Admit date: (Not on file) Date of Consult: 12/19/2018  Primary Care Provider: Antony Contras, MD Primary Cardiologist: Reola Calkins / Johnsie Cancel  Primary Electrophysiologist:  None     Patient Profile:   Toni Alvarado is a 53 y.o. female with a hx of major depression  who is being seen today for the evaluation of tachycardia at the request of Behavioral Health and Marilynne Drivers PA   History of Present Illness:   Ms. Touch 53 y.o. with multiple behavioral diagnosis including major depression, Anxiety, Panic disorede, PTSD. Med list currently includes trazodone, pamelor, ativan and cymbalta Note made of using Rexulti recently as well.    Seen by PA 4/15 and 4/22 for dizziness BP normal not postural labs ok including TSH and normal ECG 4/21 felt dizzy fell and hit her head no LOC no other stroke like symptoms  Nortriptyline stopped  HLD intolerant to crestor causes fatigue and lipitor causes joint pain   Lab review 12/10/18 Hct 39.8 Cr .80 K 4.7 LDL 164 A1c 5.8 ESR 23 RF negative   She does feel better off pamelor / nortriptyline   Past Medical History:  Diagnosis Date  . Anemia   . ANEMIA, B12 DEFICIENCY 05/01/2007   Qualifier: Diagnosis of  By: Arnoldo Morale MD, Balinda Quails   . Anxiety   . B12 deficiency   . Depression   . DEPRESSION 04/08/2007   Qualifier: Diagnosis of  By: Jimmye Norman, LPN, Winfield Cunas   . DYSLIPIDEMIA 10/06/2007   Qualifier: Diagnosis of  By: Lurlean Nanny LPN, [place]    . Generalized anxiety disorder 05/13/2014  . INTERNAL HEMORRHOIDS 10/06/2007   Qualifier: Diagnosis of  By: Lurlean Nanny LPN, [place]    . Irritable bowel syndrome 10/06/2007   Qualifier: Diagnosis of  By: Lurlean Nanny LPN, [place]    . MENORRHAGIA 10/06/2007   Qualifier: Diagnosis of  By: Lurlean Nanny LPN, Rollene Fare    . NEPHROLITHIASIS, HX OF 04/08/2007   Qualifier: Diagnosis of  By: Jimmye Norman, LPN, [place] 10/06/2007   Qualifier: Diagnosis of  By: Lurlean Nanny LPN, [place]    .  OSTEOPENIA 04/08/2007   Qualifier: Diagnosis of  By: Jimmye Norman, LPN, Winfield Cunas   . PTSD (post-traumatic stress disorder) 06/06/2018  . RECTAL BLEEDING 10/06/2007   Qualifier: Diagnosis of  By: Lurlean Nanny LPN, [place]    . Severe episode of recurrent major depressive disorder ([place]) 05/13/2014  . Suicidal ideation 05/12/2014    Past Surgical History:  Procedure Laterality Date  . LITHOTRIPSY       Home Medications:  Prior to Admission medications   Medication Sig Start Date End Date Taking? Authorizing Provider  calcium-vitamin D (OSCAL WITH D) 500-200 MG-UNIT per tablet Take 1 tablet by mouth 2 (two) times daily. For bone health Patient not taking: Reported on 09/17/2018 05/17/14   Lindell Spar I, NP  DULoxetine (CYMBALTA) 60 MG capsule Take 60 mg by mouth daily.    Cottle, Billey Co., MD  LORazepam (ATIVAN) 1 MG tablet Take 1 tablet (1 mg total) by mouth every 6 (six) hours as needed for anxiety. 05/17/14   Lindell Spar I, NP  nortriptyline (PAMELOR) 25 MG capsule 3 each night 12/01/18   Cottle, Billey Co., MD  traZODone (DESYREL) 50 MG tablet Take 1 tablet (50 mg total) by mouth at bedtime. For sleep 05/17/14   Lindell Spar I, NP    Inpatient Medications: Scheduled Meds:  Continuous Infusions:  PRN  Meds:   Allergies:   No Known Allergies  Social History:   Social History   Socioeconomic History  . Marital status: Legally Separated    Spouse name: Not on file  . Number of children: Not on file  . Years of education: Not on file  . Highest education level: Not on file  Occupational History  . Not on file  Social Needs  . Financial resource strain: Not very hard  . Food insecurity:    Worry: Never true    Inability: Never true  . Transportation needs:    Medical: No    Non-medical: No  Tobacco Use  . Smoking status: Current Every Day Smoker    Packs/day: 0.50    Types: Cigarettes  . Smokeless tobacco: Never Used  . Tobacco comment: working to stop doing this again.  Was quit  for 10 years  Substance and Sexual Activity  . Alcohol use: Yes    Comment: Occasional use but did drink some the previous week to help wiht anxitety per patient  report   . Drug use: No  . Sexual activity: Yes    Partners: Male    Birth control/protection: None  Lifestyle  . Physical activity:    Days per week: 0 days    Minutes per session: Not on file  . Stress: Very much  Relationships  . Social connections:    Talks on phone: More than three times a week    Gets together: Once a week    Attends religious service: Never    Active member of club or organization: Yes    Attends meetings of clubs or organizations: More than 4 times per year    Relationship status: Separated  . Intimate partner violence:    Fear of current or ex partner: No    Emotionally abused: No    Physically abused: No    Forced sexual activity: No  Other Topics Concern  . Not on file  Social History Narrative  . Not on file    Family History:    Family History  Problem Relation Age of Onset  . Hyperlipidemia Mother   . Mental illness Mother   . Alcohol abuse Mother   . Depression Mother   . Physical abuse Mother   . Diabetes Father   . Heart disease Father   . Hyperlipidemia Father   . Stroke Father   . Mental illness Father   . Alcohol abuse Father   . Bipolar disorder Father   . Mental illness Brother   . Cancer Brother   . ADD / ADHD Brother   . Alcohol abuse Brother   . Bipolar disorder Brother   . Sexual abuse Brother   . Depression Daughter        good with Cymbalta  . Alcohol abuse Maternal Grandfather   . Alcohol abuse Paternal Grandfather   . Breast cancer Sister   . Alcohol abuse Maternal Aunt   . Anxiety disorder Maternal Aunt   . Depression Maternal Aunt   . Drug abuse Maternal Aunt   . Alcohol abuse Maternal Uncle      ROS:  Please see the history of present illness.   All other ROS reviewed and negative.     Physical Exam/Data:   Vitals:   12/19/18 1017   BP: 118/82  Pulse: 99  SpO2: 97%  Weight: 93.4 kg  Height: 5' 3.5" (1.613 m)   '@IOBRIEF'$ @ Last 3 Weights 12/19/2018 11/07/2017 11/01/2017  Weight (  lbs) 206 lb 200 lb 198 lb  Weight (kg) 93.441 kg 90.719 kg 89.812 kg     Body mass index is 35.92 kg/m.  General:  Well nourished, well developed, in no acute distress  HEENT: normal Lymph: no adenopathy Neck: no JVD Endocrine:  No thryomegaly Vascular: No carotid bruits; FA pulses 2+ bilaterally without bruits  Cardiac:  normal S1, S2; RRR; no murmur   Lungs:  clear to auscultation bilaterally, no wheezing, rhonchi or rales  Abd: soft, nontender, no hepatomegaly  Ext: no edema Musculoskeletal:  No deformities, BUE and BLE strength normal and equal Skin: warm and dry  Neuro:  CNs 2-12 intact, no focal abnormalities noted Psych:  Normal affect   EKG:  The EKG was personally reviewed and demonstrates:  SR rate 95 normal 12/10/18    Relevant CV Studies: None  Laboratory Data:  ChemistryNo results for input(s): NA, K, CL, CO2, GLUCOSE, BUN, CREATININE, CALCIUM, GFRNONAA, GFRAA, ANIONGAP in the last 168 hours.  No results for input(s): PROT, ALBUMIN, AST, ALT, ALKPHOS, BILITOT in the last 168 hours. HematologyNo results for input(s): WBC, RBC, HGB, HCT, MCV, MCH, MCHC, RDW, PLT in the last 168 hours. Cardiac EnzymesNo results for input(s): TROPONINI in the last 168 hours. No results for input(s): TROPIPOC in the last 168 hours.  BNPNo results for input(s): BNP, PROBNP in the last 168 hours.  DDimer No results for input(s): DDIMER in the last 168 hours.  Radiology/Studies:  No results found.  Assessment and Plan:   1. Tachycardia:  Benign normal ECG monitor ordered d/c nortriptyline 2. Depresion:  With polypharmacy f/u psychiatry avoid adrenergic agents  3. Dizziness: benign not postural related to centrally acting drugs 4. Dyspnea:  Functional TTE ordered normal exam    For questions or updates, please contact [place]  Please consult [email or web address] for contact info under     Signed, Jenkins Rouge, MD  12/19/2018 10:49 AM

## 2018-12-18 NOTE — Telephone Encounter (Signed)
   Cardiac Questionnaire:    Since your last visit or hospitalization:    1. Have you been having new or worsening chest pain? NO   2. Have you been having new or worsening shortness of breath? Yes with activity- walking 3. Have you been having new or worsening leg swelling, wt gain, or increase in abdominal girth (pants fitting more tightly)? NO   4. Have you had any passing out spells? NO, but have dizziness and fell once in the past week.    *A YES to any of these questions would result in the appointment being kept. *If all the answers to these questions are NO, we should indicate that given the current situation regarding the worldwide coronarvirus pandemic, at the recommendation of the CDC, we are looking to limit gatherings in our waiting area, and thus will reschedule their appointment beyond four weeks from today.   _____________   [id number] Pre-Screening Questions:  . Do you currently have a fever? NON . Have you recently travelled on a cruise, internationally, or to [place], [place], [place], [place], [place], or [place], [place] [employer]) ? NO . Have you been in contact with someone that is currently pending confirmation of Covid19 testing or has been confirmed to have the [place] virus? NO . Are you currently experiencing fatigue or cough? NO

## 2018-12-19 ENCOUNTER — Encounter (INDEPENDENT_AMBULATORY_CARE_PROVIDER_SITE_OTHER): Payer: Self-pay

## 2018-12-19 ENCOUNTER — Other Ambulatory Visit: Payer: Self-pay

## 2018-12-19 ENCOUNTER — Encounter: Payer: Self-pay | Admitting: Cardiovascular Disease

## 2018-12-19 ENCOUNTER — Ambulatory Visit (INDEPENDENT_AMBULATORY_CARE_PROVIDER_SITE_OTHER): Payer: Self-pay | Admitting: Cardiovascular Disease

## 2018-12-19 ENCOUNTER — Telehealth: Payer: Self-pay | Admitting: Radiology

## 2018-12-19 VITALS — BP 118/82 | HR 99 | Ht 63.5 in | Wt 206.0 lb

## 2018-12-19 DIAGNOSIS — R000 Tachycardia, unspecified: Secondary | ICD-10-CM

## 2018-12-19 DIAGNOSIS — R0600 Dyspnea, unspecified: Secondary | ICD-10-CM

## 2018-12-19 DIAGNOSIS — R42 Dizziness and giddiness: Secondary | ICD-10-CM

## 2018-12-19 NOTE — Patient Instructions (Addendum)
Medication Instructions:   If you need a refill on your cardiac medications before your next appointment, please call your pharmacy.   Lab work:  If you have labs (blood work) drawn today and your tests are completely normal, you will receive your results only by: Marland Kitchen MyChart Message (if you have MyChart) OR . A paper copy in the mail If you have any lab test that is abnormal or we need to change your treatment, we will call you to review the results.  Testing/Procedures: Your physician has requested that you have an echocardiogram in July. Echocardiography is a painless test that uses sound waves to create images of your heart. It provides your doctor with information about the size and shape of your heart and how well your heart's chambers and valves are working. This procedure takes approximately one hour. There are no restrictions for this procedure.  Your physician has recommended that you wear an event monitor. Event monitors are medical devices that record the heart's electrical activity. Doctors most often [place] these monitors to diagnose arrhythmias. Arrhythmias are problems with the speed or rhythm of the heartbeat. The monitor is a small, portable device. You can wear one while you do your normal daily activities. This is usually used to diagnose what is causing palpitations/syncope (passing out).  Follow-Up: At [facility], you and your health needs are our priority.  As part of our continuing mission to provide you with exceptional heart care, we have created designated Provider Care Teams.  These Care Teams include your primary Cardiologist (physician) and Advanced Practice Providers (APPs -  Physician Assistants and Nurse Practitioners) who all work together to provide you with the care you need, when you need it. Your physician recommends that you schedule a follow-up appointment as needed with Dr. Johnsie Cancel.

## 2018-12-19 NOTE — Telephone Encounter (Signed)
LVM asking patient to call back to verify information and to go over brief instructions for the monitor her doctor orderd.

## 2018-12-24 NOTE — Telephone Encounter (Signed)
Tried calling to verify patients information and go over monitor

## 2018-12-25 NOTE — Telephone Encounter (Signed)
Enrolled patient for a 30 day [employer] Event monitor. I went over brief instructions with the patient and she knows to expect monitor in 3-4 days. Also instructed patient to talk with [employer] about the self pay/hardship price

## 2018-12-25 NOTE — Telephone Encounter (Signed)
New Message ° °Patient returning your call please call back. °

## 2019-01-05 ENCOUNTER — Ambulatory Visit (INDEPENDENT_AMBULATORY_CARE_PROVIDER_SITE_OTHER): Payer: Self-pay

## 2019-01-05 DIAGNOSIS — R0600 Dyspnea, unspecified: Secondary | ICD-10-CM

## 2019-01-05 DIAGNOSIS — R42 Dizziness and giddiness: Secondary | ICD-10-CM

## 2019-01-05 DIAGNOSIS — R000 Tachycardia, unspecified: Secondary | ICD-10-CM

## 2019-01-13 ENCOUNTER — Telehealth (HOSPITAL_COMMUNITY): Payer: Self-pay

## 2019-01-13 NOTE — Telephone Encounter (Signed)
LMTCB to schedule echo

## 2019-01-29 ENCOUNTER — Telehealth (HOSPITAL_COMMUNITY): Payer: Self-pay | Admitting: Radiology

## 2019-01-29 NOTE — Telephone Encounter (Signed)
Left message to call office-Patient needs to schedule an echocardiogram.  

## 2019-02-05 ENCOUNTER — Telehealth (HOSPITAL_COMMUNITY): Payer: Self-pay | Admitting: Radiology

## 2019-02-05 NOTE — Telephone Encounter (Signed)
Left message to call office-Patient needs to schedule an echocardiogram.  

## 2019-02-09 ENCOUNTER — Other Ambulatory Visit: Payer: Self-pay

## 2019-02-10 ENCOUNTER — Encounter (HOSPITAL_COMMUNITY): Payer: Self-pay | Admitting: Cardiovascular Disease

## 2019-04-03 ENCOUNTER — Telehealth (HOSPITAL_COMMUNITY): Payer: Self-pay

## 2019-04-03 NOTE — Telephone Encounter (Signed)
New message    Just an FYI. We have made several attempts to contact this patient including sending a letter to schedule or reschedule their echocardiogram. We will be removing the patient from the echo WQ.    8.6.20 @ 10:21am lm on home vm gesil a 6.24.20 @ 2:22pm lm on home vm - Ciana Simmon  6.16.20 @ 2:20pm lm on home vm  - both # are the same Bo Rogue  6.16.20 mail reminder letter Hartman Minahan 4.27.20 @ 9:03am both # are the same - lm on home vm to call [phone] to set up echo appt Lorita Forinash

## 2019-09-02 ENCOUNTER — Ambulatory Visit: Payer: Self-pay | Attending: Internal Medicine

## 2020-05-31 ENCOUNTER — Other Ambulatory Visit: Payer: Self-pay

## 2020-05-31 ENCOUNTER — Ambulatory Visit (INDEPENDENT_AMBULATORY_CARE_PROVIDER_SITE_OTHER): Payer: No Payment, Other | Admitting: Psychiatry

## 2020-05-31 ENCOUNTER — Encounter (HOSPITAL_COMMUNITY): Payer: Self-pay | Admitting: Psychiatry

## 2020-05-31 VITALS — BP 131/70 | HR 72 | Ht 63.5 in | Wt 202.0 lb

## 2020-05-31 DIAGNOSIS — F330 Major depressive disorder, recurrent, mild: Secondary | ICD-10-CM | POA: Diagnosis not present

## 2020-05-31 MED ORDER — FLUOXETINE HCL 40 MG PO CAPS: 40.0000 mg | ORAL_CAPSULE | Freq: Every day | ORAL | 2 refills | Status: DC
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
## 2020-07-25 ENCOUNTER — Ambulatory Visit (HOSPITAL_COMMUNITY): Payer: No Payment, Other | Admitting: Licensed Clinical Social Worker

## 2020-07-26 ENCOUNTER — Other Ambulatory Visit: Payer: Self-pay

## 2020-07-26 ENCOUNTER — Ambulatory Visit (INDEPENDENT_AMBULATORY_CARE_PROVIDER_SITE_OTHER): Payer: No Payment, Other | Admitting: Licensed Clinical Social Worker

## 2020-07-26 DIAGNOSIS — F331 Major depressive disorder, recurrent, moderate: Secondary | ICD-10-CM | POA: Diagnosis not present
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
## 2020-09-01 ENCOUNTER — Other Ambulatory Visit: Payer: Self-pay

## 2020-09-01 ENCOUNTER — Encounter (HOSPITAL_COMMUNITY): Payer: Self-pay | Admitting: Psychiatry

## 2020-09-01 ENCOUNTER — Telehealth (INDEPENDENT_AMBULATORY_CARE_PROVIDER_SITE_OTHER): Payer: No Payment, Other | Admitting: Psychiatry

## 2020-09-01 DIAGNOSIS — F331 Major depressive disorder, recurrent, moderate: Secondary | ICD-10-CM

## 2020-09-01 MED ORDER — FLUOXETINE HCL 40 MG PO CAPS: 40.0000 mg | ORAL_CAPSULE | Freq: Every day | ORAL | 2 refills | Status: DC

## 2020-09-01 MED ORDER — ARIPIPRAZOLE 5 MG PO TABS: 5.0000 mg | ORAL_TABLET | Freq: Every day | ORAL | 2 refills | Status: DC

## 2020-09-01 NOTE — Progress Notes (Signed)
BH MD/PA/NP OP Progress Note Virtual Visit via Telephone Note  I connected with Toni Alvarado on 09/01/20 at  4:00 PM EST by telephone and verified that I am speaking with the correct person using two identifiers.  Location: Patient: home Provider: Clinic   I discussed the limitations, risks, security and privacy concerns of performing an evaluation and management service by telephone and the availability of in person appointments. I also discussed with the patient that there may be a patient responsible charge related to this service. The patient expressed understanding and agreed to proceed.   I provided 30 minutes of non-face-to-face time during this encounter.   09/01/2020 4:41 PM Toni Alvarado  MRN:  [id number]  Chief Complaint:  "Im really depressed"  HPI: 55 year old female seen today for follow up psychiatric evaluation.  She has a psychiatric history of anxiety, PTSD, and depression.  She is currently managed on Prozac 40 mg daily.  She notes that her medications are somewhat effective in managing her psychiatric conditions.  Today she is unable to log on virtually so her exam was done over the phone. During assessment she is pleasant, cooperative, and engaged in conversation.  She described her mood as depressed. She notes that she believed removing stressors would improve her mood but it did not. She notes she moved out of her boyfriends mothers home, stopped providing care for his  disabled uncle, and stopped running errands for his aunt. She also notes that she works for [employer] abd believed that the holiday break would bring much needed rest. She notes that instead of resting she felt overwhelmed and anxious. Provider conducted a GAD 7and patient scored an 8.   Patient notes that she now is between homes. She notes that sometimes she stays with her ex husband and their son and other times she lives with her ex sister in law who is also her best friend. She notes  that because of this and the above stressors she feels displaced which worsens her depression. She descibes feeling flat and empty. She notes that her appetite is poor and endorses anhedonia. She endorses adequate sleep and denies SI/HI/VAH or paranoia. Provider conducted a PHQ 9 and patient scored a 21.   Patient is agreeable to start Abilify 5 mg to help manage depression. Potential side effects of medication and risks vs benefits of treatment vs non-treatment were explained and discussed. All questions were answered.She notes she has tried Abilify in the past however she notes that she can not remember how effective it was. She also informed provider that she and her boyfriend has been seeing a therapist for counseling.    She will continue all medications as prescribed.  No other concerns noted at this time. Visit Diagnosis:    ICD-10-CM   1. Moderate episode of recurrent major depressive disorder (HCC)  F33.1 FLUoxetine (PROZAC) 40 MG capsule    ARIPiprazole (ABILIFY) 5 MG tablet    Past Psychiatric History: anxiety, PTSD, and depression.   Past Medical History:  Past Medical History:  Diagnosis Date  . Anemia   . ANEMIA, B12 DEFICIENCY 05/01/2007   Qualifier: Diagnosis of  By: Lovell Sheehan MD, Balinda Quails   . Anxiety   . B12 deficiency   . Depression   . DEPRESSION 04/08/2007   Qualifier: Diagnosis of  By: Mayford Knife, LPN, Domenic Polite   . DYSLIPIDEMIA 10/06/2007   Qualifier: Diagnosis of  By: Pasty Arch LPN, [place]    . Generalized anxiety disorder 05/13/2014  . INTERNAL  HEMORRHOIDS 10/06/2007   Qualifier: Diagnosis of  By: Lurlean Nanny LPN, [place]    . Irritable bowel syndrome 10/06/2007   Qualifier: Diagnosis of  By: Lurlean Nanny LPN, [place]    . MENORRHAGIA 10/06/2007   Qualifier: Diagnosis of  By: Lurlean Nanny LPN, Rollene Fare    . NEPHROLITHIASIS, HX OF 04/08/2007   Qualifier: Diagnosis of  By: Jimmye Norman, LPN, [place] 10/06/2007   Qualifier: Diagnosis of  By: Lurlean Nanny LPN, [place]    . OSTEOPENIA 04/08/2007   Qualifier:  Diagnosis of  By: Jimmye Norman, LPN, Winfield Cunas   . PTSD (post-traumatic stress disorder) 06/06/2018  . RECTAL BLEEDING 10/06/2007   Qualifier: Diagnosis of  By: Lurlean Nanny LPN, [place]    . Severe episode of recurrent major depressive disorder ([place]) 05/13/2014  . Suicidal ideation 05/12/2014    Past Surgical History:  Procedure Laterality Date  . LITHOTRIPSY      Family Psychiatric History: mother depression and alcohol use, father alcohol use and bipolar disorder  Family History:       Family History     Family History:  Family History  Problem Relation Age of Onset  . Hyperlipidemia Mother   . Mental illness Mother   . Alcohol abuse Mother   . Depression Mother   . Physical abuse Mother   . Diabetes Father   . Heart disease Father   . Hyperlipidemia Father   . Stroke Father   . Mental illness Father   . Alcohol abuse Father   . Bipolar disorder Father   . Mental illness Brother   . Cancer Brother   . ADD / ADHD Brother   . Alcohol abuse Brother   . Bipolar disorder Brother   . Sexual abuse Brother   . Depression Daughter        good with Cymbalta  . Alcohol abuse Maternal Grandfather   . Alcohol abuse Paternal Grandfather   . Breast cancer Sister   . Alcohol abuse Maternal Aunt   . Anxiety disorder Maternal Aunt   . Depression Maternal Aunt   . Drug abuse Maternal Aunt   . Alcohol abuse Maternal Uncle     Social History:  Social History   Socioeconomic History  . Marital status: Legally Separated    Spouse name: Not on file  . Number of children: Not on file  . Years of education: Not on file  . Highest education level: Not on file  Occupational History  . Not on file  Tobacco Use  . Smoking status: Current Every Day Smoker    Packs/day: 0.50    Types: Cigarettes  . Smokeless tobacco: Never Used  . Tobacco comment: working to stop doing this again.  Was quit for 10 years  Substance and Sexual Activity  . Alcohol use: Yes    Comment: Occasional use but did  drink some the previous week to help wiht anxitety per patient  report   . Drug use: No  . Sexual activity: Yes    Partners: Male    Birth control/protection: None  Other Topics Concern  . Not on file  Social History Narrative  . Not on file   Social Determinants of Health   Financial Resource Strain: Not on file  Food Insecurity: Not on file  Transportation Needs: Not on file  Physical Activity: Not on file  Stress: Not on file  Social Connections: Not on file    Allergies: No Known Allergies  Metabolic Disorder Labs: No results found for: HGBA1C,  MPG No results found for: PROLACTIN Lab Results  Component Value Date   CHOL 293 (HH) 01/02/2007   TRIG 249 (HH) 01/02/2007   HDL 42.5 01/02/2007   CHOLHDL 6.9 CALC 01/02/2007   VLDL 50 (H) 01/02/2007   Lab Results  Component Value Date   TSH 2.180 05/13/2014   TSH 1.49 08/08/2006    Therapeutic Level Labs: No results found for: LITHIUM No results found for: VALPROATE No components found for:  CBMZ  Current Medications: Current Outpatient Medications  Medication Sig Dispense Refill  . ARIPiprazole (ABILIFY) 5 MG tablet Take 1 tablet (5 mg total) by mouth daily. 30 tablet 2  . calcium-vitamin D (OSCAL WITH D) 500-200 MG-UNIT per tablet Take 1 tablet by mouth 2 (two) times daily. For bone health (Patient not taking: Reported on 05/31/2020) 30 tablet 0  . FLUoxetine (PROZAC) 40 MG capsule Take 1 capsule (40 mg total) by mouth daily. 30 capsule 2   No current facility-administered medications for this visit.     Musculoskeletal: Strength & Muscle Tone: Unable to assess due to telehealth visit [place]: Unable to assess due to telehealth visit Patient leans: N/A  Psychiatric Specialty Exam: Review of Systems  Last menstrual period 05/14/2014.There is no height or weight on file to calculate BMI.  General Appearance: Unable to assess due to telehealth visit  Eye Contact:  Unable to assess due to telehealth  visit  Speech:  Clear and Coherent and Normal Rate  Volume:  Normal  Mood:  Anxious and Depressed  Affect:  Appropriate and Congruent  Thought Process:  Coherent, Goal Directed and Linear  Orientation:  Full (Time, Place, and Person)  Thought Content: WDL and Logical   Suicidal Thoughts:  Yes.  without intent/plan  Homicidal Thoughts:  No  Memory:  Immediate;   Good Recent;   Good Remote;   Good  Judgement:  Good  Insight:  Good  Psychomotor Activity:  Normal  Concentration:  Concentration: Good and Attention Span: Good  Recall:  Good  Fund of Knowledge: Good  Language: Fair  Akathisia:  No  Handed:  Right  AIMS (if indicated): Not done  Assets:  Communication Skills Desire for Improvement Financial Resources/Insurance Housing Social Support  ADL's:  Intact  Cognition: WNL  Sleep:  Good   Screenings: AUDIT   Flowsheet Row Admission (Discharged) from 05/12/2014 in [place] [facility]  Alcohol Use Disorder Identification Test Final Score (AUDIT) 0    GAD-7   Flowsheet Row Video Visit from 09/01/2020 in [facility] Counselor from 11/08/2017 in [place] Counselor from 10/23/2017 in [place]  Total GAD-7 Score 8 3 17     PHQ2-9   Flowsheet Row Video Visit from 09/01/2020 in [facility] Counselor from 11/08/2017 in [place] Counselor from 10/23/2017 in [place]  PHQ-2 Total Score 6 0 6  PHQ-9 Total Score 21 7 25        Assessment and Plan: Patient endorses symptoms of depression. She is agreeable to start Abilify 5 mg to help manage depression.  She will follow up with counseling for therapy.  1. Moderate episode of recurrent major depressive disorder (HCC) Continue- FLUoxetine (PROZAC) 40 MG capsule; Take 1 capsule (40 mg total) by mouth  daily.  Dispense: 30 capsule; Refill: 2 Start- ARIPiprazole (ABILIFY) 5 MG tablet; Take 1 tablet (5 mg total) by mouth daily.  Dispense: 30  tablet; Refill: 2   Follow up in 6 weeks Follow up with therapy  Salley Slaughter, NP 09/01/2020, 4:41 PM

## 2020-09-21 ENCOUNTER — Other Ambulatory Visit: Payer: Self-pay

## 2020-09-21 ENCOUNTER — Ambulatory Visit (INDEPENDENT_AMBULATORY_CARE_PROVIDER_SITE_OTHER): Payer: No Payment, Other | Admitting: Licensed Clinical Social Worker

## 2020-09-21 DIAGNOSIS — F330 Major depressive disorder, recurrent, mild: Secondary | ICD-10-CM

## 2020-09-24 NOTE — Progress Notes (Signed)
THERAPIST PROGRESS NOTE   Virtual Visit via Video Note  I connected with Toni Alvarado on 09/21/20 at  4:00 PM EST by a video enabled telemedicine application and verified that I am speaking with the correct person using two identifiers.  Location: Patient: Outside of work sitting on a bench alone Provider: Home   I discussed the limitations of evaluation and management by telemedicine and the availability of in person appointments. The patient expressed understanding and agreed to proceed. I discussed the assessment and treatment plan with the patient. The patient was provided an opportunity to ask questions and all were answered. The patient agreed with the plan and demonstrated an understanding of the instructions.   I provided 45 minutes of non-face-to-face time during this encounter.  Participation Level: Active  Behavioral Response: CasualAlertMildly depressed  Type of Therapy: Individual Therapy  Treatment Goals addressed: Communication: Depression/coping  Interventions: Motivational Interviewing and Supportive  Summary: Toni Alvarado is a 55 y.o. female who presents with hx of MDD. This date pt signs on for video session. She is at work and goes outside for privacy. This is first session since initial session end of Nov. LCSW acknowledges gap and assesses for overall status/any significant changes. Pt reports she is doing better at the present time but had a "rough Christmas". Pt reports she continued to struggle with a place to stay. She had to leave sil home d/t sil needing to start to care for her mil in the home. Pt advises her boyfriend, Toni Alvarado, was trying to get her to come back to live with him but she declined. She states just before Christmas day her ex, Toni Alvarado, allowed her to come to stay with him. She states they have remained friends and this is working out well. She advises she is able to spend more time with her adult son chronically ill with lyme disease who also  lives there. She is working on saving money to get her own place. Pt feels the most stability she has had in some time and reports she will probably stay where she is ~ 6 mon. LCSW assessed for status of meds. Pt reports she had Abilify added to regimen in early Jan. She states she can appreciate benefits. Reports "My energy is up and I have more clarity of thought". Pt reports she has a return of feelings of hope. She provides update on status of relationship with Toni Alvarado when asked. Pt states they are in couples therapy and have had 3 sessions, going weekly. She states they are trying to work out their 12 yr relationship. She provides multiple details. She states Toni Alvarado has recently asked her to help with care of his mother as she has upcoming surgery. This was explored as pt relayed to Toni Alvarado she needed limited involvement with his mother. Pt states she will be sure to address this further in their couples therapy sessions. Pt reports she is looking for "a better job". She is wanting to find something with better pay yet states she "loves" her current job. Pt trying to save for a car saying hers is not currently operational. LCSW assisted to explore this change/choice and timing. Remainder of session spent addressing coping strategies. Pt reports she relies heavily on her faith, she talks to friends/fam, she reports she used to do some journaling. LCSW encouraged return to journaling, implementing walking 3xwk, and provided education on Daily Calm. Pt agrees to consider all discussed. LCSW reviewed poc including scheduling prior to close of session. Pt  states appreciation for care.   Suicidal/Homicidal: Nowithout intent/plan  Therapist Response: Pt remains responsive to care.  Plan: Return again in 2 weeks.  Diagnosis: Axis I: Mild MDD    Axis II: Deferred  Hermine Messick, LCSW 09/24/2020

## 2020-10-05 ENCOUNTER — Ambulatory Visit (INDEPENDENT_AMBULATORY_CARE_PROVIDER_SITE_OTHER): Payer: No Payment, Other | Admitting: Licensed Clinical Social Worker

## 2020-10-05 ENCOUNTER — Other Ambulatory Visit: Payer: Self-pay

## 2020-10-05 DIAGNOSIS — F330 Major depressive disorder, recurrent, mild: Secondary | ICD-10-CM

## 2020-10-12 ENCOUNTER — Other Ambulatory Visit: Payer: Self-pay

## 2020-10-12 ENCOUNTER — Telehealth (INDEPENDENT_AMBULATORY_CARE_PROVIDER_SITE_OTHER): Payer: No Payment, Other | Admitting: Psychiatry

## 2020-10-12 ENCOUNTER — Encounter (HOSPITAL_COMMUNITY): Payer: Self-pay | Admitting: Psychiatry

## 2020-10-12 DIAGNOSIS — F331 Major depressive disorder, recurrent, moderate: Secondary | ICD-10-CM | POA: Diagnosis not present

## 2020-10-12 MED ORDER — FLUOXETINE HCL 40 MG PO CAPS: 40.0000 mg | ORAL_CAPSULE | Freq: Every day | ORAL | 2 refills | Status: DC

## 2020-10-12 MED ORDER — ARIPIPRAZOLE 5 MG PO TABS: 5.0000 mg | ORAL_TABLET | Freq: Every day | ORAL | 2 refills | Status: DC

## 2020-10-12 NOTE — Progress Notes (Signed)
BH MD/PA/NP OP Progress Note Virtual Visit via Video Note  I connected with Toni Alvarado on 10/12/20 at  3:00 PM EST by a video enabled telemedicine application and verified that I am speaking with the correct person using two identifiers.  Location: Patient: Home Toni Alvarado: Clinic   I discussed the limitations of evaluation and management by telemedicine and the availability of in person appointments. The patient expressed understanding and agreed to proceed.  I provided 30 minutes of non-face-to-face time during this encounter.      10/12/2020 3:20 PM Darrell Leonhardt  MRN:  [id number]  Chief Complaint:  "The Abilify is working super well"  HPI: 55 year old female seen today for follow up psychiatric evaluation.  She has a psychiatric history of anxiety, PTSD, and depression.  She is currently managed on Prozac 40 mg daily and Abilify 5 mg daily.  She notes that her medications are effective in managing her psychiatric conditions.  Today she is well groomed, cooperative, engaged in conversation, and maintained eye contact.  She informed Kymere Fullington that since starting her Abilify she has been doing well.  She notes that she has very minimal anxiety and depression.  Daisean Brodhead conducted a GAD-7 and patient scored a 0, at her last visit she scored a 8.  Valdis Bevill also conducted a PHQ-9 and patient scored a 2.  At his last visit she scored a 21.  Today she denies SI/HI/VAH or paranoia.    Patient informed Neyda Durango that she believes she is on the correct medication regimen.  She notes that she is no longer in a stressful situation, she is working on her relationship with her boyfriend, and she is enjoying being back at work.  She informed Eriberto Felch that her son is not doing well however notes that he will follow up with outpatient psychiatry in the near future for assistance.  No medication changes made today.  Patient agreeable to continue medications as prescribed.  She will follow up with outpatient  counseling for therapy.  She also notes that she continues seeing a therapist with her boyfriend.  No other concerns noted at this time.  Visit Diagnosis:    ICD-10-CM   1. Moderate episode of recurrent major depressive disorder (HCC)  F33.1 ARIPiprazole (ABILIFY) 5 MG tablet    FLUoxetine (PROZAC) 40 MG capsule    Past Psychiatric History: anxiety, PTSD, and depression.   Past Medical History:  Past Medical History:  Diagnosis Date  . Anemia   . ANEMIA, B12 DEFICIENCY 05/01/2007   Qualifier: Diagnosis of  By: Arnoldo Morale MD, Balinda Quails   . Anxiety   . B12 deficiency   . Depression   . DEPRESSION 04/08/2007   Qualifier: Diagnosis of  By: Jimmye Norman, LPN, Winfield Cunas   . DYSLIPIDEMIA 10/06/2007   Qualifier: Diagnosis of  By: Lurlean Nanny LPN, [place]    . Generalized anxiety disorder 05/13/2014  . INTERNAL HEMORRHOIDS 10/06/2007   Qualifier: Diagnosis of  By: Lurlean Nanny LPN, [place]    . Irritable bowel syndrome 10/06/2007   Qualifier: Diagnosis of  By: Lurlean Nanny LPN, [place]    . MENORRHAGIA 10/06/2007   Qualifier: Diagnosis of  By: Lurlean Nanny LPN, Rollene Fare    . NEPHROLITHIASIS, HX OF 04/08/2007   Qualifier: Diagnosis of  By: Jimmye Norman, LPN, [place] 10/06/2007   Qualifier: Diagnosis of  By: Lurlean Nanny LPN, [place]    . OSTEOPENIA 04/08/2007   Qualifier: Diagnosis of  By: Jimmye Norman, LPN, Winfield Cunas   . PTSD (post-traumatic stress disorder) 06/06/2018  .  RECTAL BLEEDING 10/06/2007   Qualifier: Diagnosis of  By: Lurlean Nanny LPN, [place]    . Severe episode of recurrent major depressive disorder ([place]) 05/13/2014  . Suicidal ideation 05/12/2014    Past Surgical History:  Procedure Laterality Date  . LITHOTRIPSY      Family Psychiatric History: mother depression and alcohol use, father alcohol use and bipolar disorder  Family History:       Family History     Family History:  Family History  Problem Relation Age of Onset  . Hyperlipidemia Mother   . Mental illness Mother   . Alcohol abuse Mother   . Depression Mother   .  Physical abuse Mother   . Diabetes Father   . Heart disease Father   . Hyperlipidemia Father   . Stroke Father   . Mental illness Father   . Alcohol abuse Father   . Bipolar disorder Father   . Mental illness Brother   . Cancer Brother   . ADD / ADHD Brother   . Alcohol abuse Brother   . Bipolar disorder Brother   . Sexual abuse Brother   . Depression Daughter        good with Cymbalta  . Alcohol abuse Maternal Grandfather   . Alcohol abuse Paternal Grandfather   . Breast cancer Sister   . Alcohol abuse Maternal Aunt   . Anxiety disorder Maternal Aunt   . Depression Maternal Aunt   . Drug abuse Maternal Aunt   . Alcohol abuse Maternal Uncle     Social History:  Social History   Socioeconomic History  . Marital status: Legally Separated    Spouse name: Not on file  . Number of children: Not on file  . Years of education: Not on file  . Highest education level: Not on file  Occupational History  . Not on file  Tobacco Use  . Smoking status: Current Every Day Smoker    Packs/day: 0.50    Types: Cigarettes  . Smokeless tobacco: Never Used  . Tobacco comment: working to stop doing this again.  Was quit for 10 years  Substance and Sexual Activity  . Alcohol use: Yes    Comment: Occasional use but did drink some the previous week to help wiht anxitety per patient  report   . Drug use: No  . Sexual activity: Yes    Partners: Male    Birth control/protection: None  Other Topics Concern  . Not on file  Social History Narrative  . Not on file   Social Determinants of Health   Financial Resource Strain: Not on file  Food Insecurity: Not on file  Transportation Needs: Not on file  Physical Activity: Not on file  Stress: Not on file  Social Connections: Not on file    Allergies: No Known Allergies  Metabolic Disorder Labs: No results found for: HGBA1C, MPG No results found for: PROLACTIN Lab Results  Component Value Date   CHOL 293 (HH) 01/02/2007   TRIG 249  (HH) 01/02/2007   HDL 42.5 01/02/2007   CHOLHDL 6.9 CALC 01/02/2007   VLDL 50 (H) 01/02/2007   Lab Results  Component Value Date   TSH 2.180 05/13/2014   TSH 1.49 08/08/2006    Therapeutic Level Labs: No results found for: LITHIUM No results found for: VALPROATE No components found for:  CBMZ  Current Medications: Current Outpatient Medications  Medication Sig Dispense Refill  . ARIPiprazole (ABILIFY) 5 MG tablet Take 1 tablet (5 mg total) by mouth  daily. 30 tablet 2  . calcium-vitamin D (OSCAL WITH D) 500-200 MG-UNIT per tablet Take 1 tablet by mouth 2 (two) times daily. For bone health (Patient not taking: Reported on 05/31/2020) 30 tablet 0  . FLUoxetine (PROZAC) 40 MG capsule Take 1 capsule (40 mg total) by mouth daily. 30 capsule 2   No current facility-administered medications for this visit.     Musculoskeletal: Strength & Muscle Tone: Unable to assess due to telehealth visit [place]: Unable to assess due to telehealth visit Patient leans: N/A  Psychiatric Specialty Exam: Review of Systems  Last menstrual period 05/14/2014.There is no height or weight on file to calculate BMI.  General Appearance: Unable to assess due to telehealth visit  Eye Contact:  Unable to assess due to telehealth visit  Speech:  Clear and Coherent and Normal Rate  Volume:  Normal  Mood:  Euthymic  Affect:  Appropriate and Congruent  Thought Process:  Coherent, Goal Directed and Linear  Orientation:  Full (Time, Place, and Person)  Thought Content: WDL and Logical   Suicidal Thoughts:  No  Homicidal Thoughts:  No  Memory:  Immediate;   Good Recent;   Good Remote;   Good  Judgement:  Good  Insight:  Good  Psychomotor Activity:  Normal  Concentration:  Concentration: Good and Attention Span: Good  Recall:  Good  Fund of Knowledge: Good  Language: Fair  Akathisia:  No  Handed:  Right  AIMS (if indicated): Not done  Assets:  Communication Skills Desire for  Improvement Financial Resources/Insurance Housing Social Support  ADL's:  Intact  Cognition: WNL  Sleep:  Good   Screenings: AUDIT   Flowsheet Row Admission (Discharged) from 05/12/2014 in [place] [facility]  Alcohol Use Disorder Identification Test Final Score (AUDIT) 0    GAD-7   Flowsheet Row Video Visit from 10/12/2020 in [facility] Video Visit from 09/01/2020 in [facility] Counselor from 11/08/2017 in [place] Counselor from 10/23/2017 in [place]  Total GAD-7 Score 0 8 3 17     PHQ2-9   Flowsheet Row Video Visit from 10/12/2020 in [facility] Video Visit from 09/01/2020 in [facility] Counselor from 11/08/2017 in [place] Counselor from 10/23/2017 in [place]  PHQ-2 Total Score 0 6 0 6  PHQ-9 Total Score 2 21 7 25     Flowsheet Row Video Visit from 10/12/2020 in [place] No Risk       Assessment and Plan: Patient notes that she is doing well on her current medication regimen.  No medication changes made today.  Patient agreeable to continue all medication as prescribed.  1. Moderate episode of recurrent major depressive disorder (HCC) Continue- FLUoxetine (PROZAC) 40 MG capsule; Take 1 capsule (40 mg total) by mouth daily.  Dispense: 30 capsule; Refill: 2 Start- ARIPiprazole (ABILIFY) 5 MG tablet; Take 1 tablet (5 mg total) by mouth daily.  Dispense: 30 tablet; Refill: 2   Follow up in 3 months Follow up with therapy  Salley Slaughter, NP 10/12/2020, 3:20 PM

## 2020-10-12 NOTE — Progress Notes (Signed)
   THERAPIST PROGRESS NOTE  Session Time: 55 min  Participation Level: Active  Behavioral Response: CasualAlertMildly depressed  Type of Therapy: Individual Therapy  Treatment Goals addressed: Communication: Depression/Coping  Interventions: Motivational Interviewing and Supportive  Summary: Toni Alvarado is a 55 y.o. female who presents with hx of MDD. This date pt comes for in person session per her preference. Pt advises she continues to be much improved with feelings of dep. She is taking meds as prescribed and states she has motivation to accomplish tasks she has not been able to do. She states she is going through all of her belongings in storage to purge, which she is finding therapeutic. She states "When you're not depressed it's not a chore". She is trying to get herself organized to be able to move into her own apt/home as soon as she can afford it. She advises the situation staying with her ex continues to go well. Remainder of session spent addressing pt's goal of making a decision r/t to relationship with [name]. Pt provides many more details about his controlling nature with particular examples about accusing her of an affair because she did not feel up to having sex and coming between her and her sister she states is her "best friend". Pt even missed attendance at sister's 60th birthday party in Oct. d/t trying to avoid an argument with [name]. Pt states "I realized the relationship is changing who I am". Pt advises with couples therapy [name] has made some improvements but pt fearful they will not be sustained. She reports he is questioning her more heavily about getting married and wanting to proceed. Pt setting appropriate boundaries. Issues with Warren's mother remain unresolved. LCSW assisted to process thoughts/feelings. Pt will make a pro/con list prior to next session re relationship with [name]. Pt is walking as recommended and finds this helpful, she is also doing some  journaling. She forgot to explore use of Daily Calm. LCSW showed pt this tool prior to close of session. Reviewed poc including scheduling. Pt states appreciation for care.   Suicidal/Homicidal: Nowithout intent/plan  Therapist Response: Pt remains receptive to care.  Plan: Return again in ~2 weeks.  Diagnosis: Axis I: MDD, mild    Axis II: Deferred  Hermine Messick, LCSW 10/12/2020

## 2020-10-20 ENCOUNTER — Ambulatory Visit (HOSPITAL_COMMUNITY): Payer: Self-pay | Admitting: Licensed Clinical Social Worker

## 2020-11-08 ENCOUNTER — Other Ambulatory Visit: Payer: Self-pay | Admitting: Internal Medicine

## 2020-11-08 DIAGNOSIS — Z0000 Encounter for general adult medical examination without abnormal findings: Secondary | ICD-10-CM

## 2020-11-08 DIAGNOSIS — E785 Hyperlipidemia, unspecified: Secondary | ICD-10-CM

## 2020-11-10 ENCOUNTER — Other Ambulatory Visit: Payer: Self-pay

## 2020-11-10 ENCOUNTER — Ambulatory Visit (INDEPENDENT_AMBULATORY_CARE_PROVIDER_SITE_OTHER): Payer: No Payment, Other | Admitting: Licensed Clinical Social Worker

## 2020-11-10 DIAGNOSIS — F330 Major depressive disorder, recurrent, mild: Secondary | ICD-10-CM

## 2020-11-14 NOTE — Progress Notes (Signed)
   THERAPIST PROGRESS NOTE  Session Time: 42 min  Participation Level: Active  Behavioral Response: CasualAlertmildly dep  Type of Therapy: Individual Therapy  Treatment Goals addressed: Communication: dep/coping  Interventions: Supportive  Summary: Toni Alvarado is a 55 y.o. female who presents with hx of MDD. This date pt comes for in person session per her preference. Pt reports she is doing fairly well. She states she has been on a couple of interviews for a new job. 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 Pt advises she interviewed with a 6 person panel and reports she did very well. 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 She states she has a f/u to shadow someone in the coming days. Pt states both will pay more than what she has now. She advises she is able to save some money now toward goal of getting her own apt but will be able to escalate savings if one of these works out. Pt reports she is taking meds as prescribed. Pt feels she may have noticed extra fatigue r/t meds and plans to call med provider about same. Remainder of session spent addressing relationship status/concerns with [name]. Pt reports she did the recommended pros/cons list and meant to bring it, forgot. She states there were more pros than cons "but the cons were significant". Cons r/t work and a steady income, relationship with mother. Pt advises [name] has completed chemo but is still not back to work. Pt states they continue couples counseling. She provides details and comments on the improvements [name] has made. Pt states regardless she plans to delay marriage and get her own place to live asap. Pt denies any new worries/concerns. Wished pt well r/t her work options/leads. Reviewed poc including scheduling prior to close of session. Pt states appreciation for care.  Suicidal/Homicidal: Nowithout intent/plan  Therapist Response: Pt remains receptive to care.  Plan: Return  again in ~3 weeks.  Diagnosis: Axis I: MDD, mild    Axis II: Deferred  Hermine Messick, LCSW 11/14/2020

## 2020-11-23 ENCOUNTER — Ambulatory Visit
Admission: RE | Admit: 2020-11-23 | Discharge: 2020-11-23 | Disposition: A | Discharge status: Home / Self Care | Payer: No Typology Code available for payment source | Source: Ambulatory Visit | Attending: Internal Medicine | Admitting: Internal Medicine

## 2020-11-23 DIAGNOSIS — E785 Hyperlipidemia, unspecified: Secondary | ICD-10-CM

## 2020-11-23 DIAGNOSIS — Z0000 Encounter for general adult medical examination without abnormal findings: Secondary | ICD-10-CM

## 2020-11-24 DIAGNOSIS — Q513 Bicornate uterus: Secondary | ICD-10-CM | POA: Insufficient documentation

## 2020-11-24 DIAGNOSIS — R159 Full incontinence of feces: Secondary | ICD-10-CM | POA: Insufficient documentation

## 2020-11-28 ENCOUNTER — Encounter (HOSPITAL_COMMUNITY): Payer: Self-pay | Admitting: Psychiatry

## 2020-11-28 ENCOUNTER — Telehealth (INDEPENDENT_AMBULATORY_CARE_PROVIDER_SITE_OTHER): Payer: No Payment, Other | Admitting: Psychiatry

## 2020-11-28 ENCOUNTER — Other Ambulatory Visit: Payer: Self-pay

## 2020-11-28 DIAGNOSIS — F331 Major depressive disorder, recurrent, moderate: Secondary | ICD-10-CM | POA: Diagnosis not present

## 2020-11-28 MED ORDER — FLUOXETINE HCL 20 MG PO CAPS: 20.0000 mg | ORAL_CAPSULE | Freq: Every day | ORAL | 2 refills | Status: DC

## 2020-11-28 MED ORDER — ARIPIPRAZOLE 5 MG PO TABS: 5.0000 mg | ORAL_TABLET | Freq: Every day | ORAL | 2 refills | Status: DC

## 2020-11-28 NOTE — Progress Notes (Signed)
[place] MD/PA/NP OP Progress Note Virtual Visit via Video Note  I connected with Toni Alvarado on 11/28/20 at  3:00 PM EDT by a video enabled telemedicine application and verified that I am speaking with the correct person using two identifiers.  Location: Patient: Home Provider: Clinic   I discussed the limitations of evaluation and management by telemedicine and the availability of in person appointments. The patient expressed understanding and agreed to proceed.  I provided 30 minutes of non-face-to-face time during this encounter.      11/28/2020 3:29 PM Toni Alvarado  MRN:  [id number]  Chief Complaint:  "Im having a lot of back pain, neck pain, and fatigue"  HPI: 55 year old female seen today for follow up psychiatric evaluation.  She has a psychiatric history of anxiety, PTSD, and depression.  She is currently managed on Prozac 40 mg daily and Abilify 5 mg daily.  She notes that she discontinued her medications and is now feeling more depressed.  Today she is well groomed, cooperative, engaged in conversation, and maintained eye contact.  She informed provider that since her last visit she discontinued Prozac and Abilify.  She informed provider that she was having chronic fatigue and notes that she thought that it could have been her psychiatric medications.  She notes that she was seen by her PCP who ruled out thyroid issues or low B12.  She notes that he suggested having a sleep study done however report that it has not been ordered yet.  Patient notes that since discontinuing her medications she has been increasingly depressed.  Today provider conducted a PHQ-9 and patient scored a 19, at her last visit she scored a 2.  She notes that her anxiety continues to be minimal.  Provider conducted a GAD-7 and patient scored a 4, at her last visit she scored a 0.  Patient notes that she has been having increased back pain and neck pain which contributes to her anxiety and depression.  She informed  provider that she will follow-up with her orthopedic doctor on December 15, 2020 for further evaluation.  Today she denies SI/HI/VAH, mania, or paranoia.  Patient notes that her appetite fluctuates and her sleep is often disturbed.  She notes that she stays in bed all day (noting that she goes to work and then returns to bed.  She also notes on her days off she just stays in bed).  Today patient agreeable to restarting Prozac 20 mg daily to help manage symptoms of anxiety and depression.  She is also agreeable to restarting Abilify 5 mg to help manage depression.  She will follow up with outpatient counseling for therapy.  No other concerns noted at this time.  Visit Diagnosis:    ICD-10-CM   1. Moderate episode of recurrent major depressive disorder (HCC)  F33.1 ARIPiprazole (ABILIFY) 5 MG tablet    Past Psychiatric History: anxiety, PTSD, and depression.   Past Medical History:  Past Medical History:  Diagnosis Date  . Anemia   . ANEMIA, B12 DEFICIENCY 05/01/2007   Qualifier: Diagnosis of  By: Arnoldo Morale MD, Balinda Quails   . Anxiety   . B12 deficiency   . Depression   . DEPRESSION 04/08/2007   Qualifier: Diagnosis of  By: Jimmye Norman, LPN, Winfield Cunas   . DYSLIPIDEMIA 10/06/2007   Qualifier: Diagnosis of  By: Lurlean Nanny LPN, [place]    . Generalized anxiety disorder 05/13/2014  . INTERNAL HEMORRHOIDS 10/06/2007   Qualifier: Diagnosis of  By: Lurlean Nanny LPN, [place]    .  Irritable bowel syndrome 10/06/2007   Qualifier: Diagnosis of  By: Lurlean Nanny LPN, [place]    . MENORRHAGIA 10/06/2007   Qualifier: Diagnosis of  By: Lurlean Nanny LPN, Rollene Fare    . NEPHROLITHIASIS, HX OF 04/08/2007   Qualifier: Diagnosis of  By: Jimmye Norman, LPN, [place] 10/06/2007   Qualifier: Diagnosis of  By: Lurlean Nanny LPN, [place]    . OSTEOPENIA 04/08/2007   Qualifier: Diagnosis of  By: Jimmye Norman, LPN, Winfield Cunas   . PTSD (post-traumatic stress disorder) 06/06/2018  . RECTAL BLEEDING 10/06/2007   Qualifier: Diagnosis of  By: Lurlean Nanny LPN, [place]    . Severe episode of  recurrent major depressive disorder ([place]) 05/13/2014  . Suicidal ideation 05/12/2014    Past Surgical History:  Procedure Laterality Date  . LITHOTRIPSY      Family Psychiatric History: mother depression and alcohol use, father alcohol use and bipolar disorder  Family History:       Family History     Family History:  Family History  Problem Relation Age of Onset  . Hyperlipidemia Mother   . Mental illness Mother   . Alcohol abuse Mother   . Depression Mother   . Physical abuse Mother   . Diabetes Father   . Heart disease Father   . Hyperlipidemia Father   . Stroke Father   . Mental illness Father   . Alcohol abuse Father   . Bipolar disorder Father   . Mental illness Brother   . Cancer Brother   . ADD / ADHD Brother   . Alcohol abuse Brother   . Bipolar disorder Brother   . Sexual abuse Brother   . Depression Daughter        good with Cymbalta  . Alcohol abuse Maternal Grandfather   . Alcohol abuse Paternal Grandfather   . Breast cancer Sister   . Alcohol abuse Maternal Aunt   . Anxiety disorder Maternal Aunt   . Depression Maternal Aunt   . Drug abuse Maternal Aunt   . Alcohol abuse Maternal Uncle     Social History:  Social History   Socioeconomic History  . Marital status: Legally Separated    Spouse name: Not on file  . Number of children: Not on file  . Years of education: Not on file  . Highest education level: Not on file  Occupational History  . Not on file  Tobacco Use  . Smoking status: Current Every Day Smoker    Packs/day: 0.50    Types: Cigarettes  . Smokeless tobacco: Never Used  . Tobacco comment: working to stop doing this again.  Was quit for 10 years  Substance and Sexual Activity  . Alcohol use: Yes    Comment: Occasional use but did drink some the previous week to help wiht anxitety per patient  report   . Drug use: No  . Sexual activity: Yes    Partners: Male    Birth control/protection: None  Other Topics Concern  . Not  on file  Social History Narrative  . Not on file   Social Determinants of Health   Financial Resource Strain: Not on file  Food Insecurity: Not on file  Transportation Needs: Not on file  Physical Activity: Not on file  Stress: Not on file  Social Connections: Not on file    Allergies: No Known Allergies  Metabolic Disorder Labs: No results found for: HGBA1C, MPG No results found for: PROLACTIN Lab Results  Component Value Date   CHOL 293 (  HH) 01/02/2007   TRIG 249 (HH) 01/02/2007   HDL 42.5 01/02/2007   CHOLHDL 6.9 CALC 01/02/2007   VLDL 50 (H) 01/02/2007   Lab Results  Component Value Date   TSH 2.180 05/13/2014   TSH 1.49 08/08/2006    Therapeutic Level Labs: No results found for: LITHIUM No results found for: VALPROATE No components found for:  CBMZ  Current Medications: Current Outpatient Medications  Medication Sig Dispense Refill  . FLUoxetine (PROZAC) 20 MG capsule Take 1 capsule (20 mg total) by mouth daily. 30 capsule 2  . ARIPiprazole (ABILIFY) 5 MG tablet Take 1 tablet (5 mg total) by mouth daily. 30 tablet 2  . calcium-vitamin D (OSCAL WITH D) 500-200 MG-UNIT per tablet Take 1 tablet by mouth 2 (two) times daily. For bone health (Patient not taking: Reported on 05/31/2020) 30 tablet 0   No current facility-administered medications for this visit.     Musculoskeletal: Strength & Muscle Tone: Unable to assess due to telehealth visit [place]: Unable to assess due to telehealth visit Patient leans: N/A  Psychiatric Specialty Exam: Review of Systems  Last menstrual period 05/14/2014.There is no height or weight on file to calculate BMI.  General Appearance: Well Groomed  Eye Contact:  Good  Speech:  Clear and Coherent and Slow  Volume:  Normal  Mood:  Depressed  Affect:  Appropriate and Congruent  Thought Process:  Coherent, Goal Directed and Linear  Orientation:  Full (Time, Place, and Person)  Thought Content: WDL and Logical    Suicidal Thoughts:  No  Homicidal Thoughts:  No  Memory:  Immediate;   Good Recent;   Good Remote;   Good  Judgement:  Good  Insight:  Good  Psychomotor Activity:  Normal  Concentration:  Concentration: Good and Attention Span: Good  Recall:  Good  Fund of Knowledge: Good  Language: Fair  Akathisia:  No  Handed:  Right  AIMS (if indicated): Not done  Assets:  Communication Skills Desire for Improvement Financial Resources/Insurance Housing Social Support  ADL's:  Intact  Cognition: WNL  Sleep:  Fair   Screenings: AUDIT   Flowsheet Row Admission (Discharged) from 05/12/2014 in [facility]  Alcohol Use Disorder Identification Test Final Score (AUDIT) 0    GAD-7   Flowsheet Row Video Visit from 11/28/2020 in [facility] Video Visit from 10/12/2020 in [facility] Video Visit from 09/01/2020 in [facility] Counselor from 11/08/2017 in [place] Counselor from 10/23/2017 in [place]  Total GAD-7 Score 4 0 8 3 17     PHQ2-9   Flowsheet Row Video Visit from 11/28/2020 in [facility] Video Visit from 10/12/2020 in [facility] Video Visit from 09/01/2020 in [facility] Counselor from 11/08/2017 in [place] Counselor from 10/23/2017 in [place]  PHQ-2 Total Score 6 0 6 0 6  PHQ-9 Total Score 19 2 21 7 25     Flowsheet Row Video Visit from 11/28/2020 in [facility] Video Visit from 10/12/2020 in [place] No Risk No Risk       Assessment and Plan: Patient endorses increased depression and poor sleep.  Today patient agreeable to restarting Prozac 20 mg daily  to help manage symptoms of anxiety and depression.  She is also  agreeable to restarting Abilify 5 mg to help manage depression  1. Moderate episode of recurrent major depressive disorder (HCC) Restart- FLUoxetine (PROZAC) 20 MG capsule; Take 1 capsule (20 mg total) by mouth daily.  Dispense: 30 capsule; Refill: 2 Restart- ARIPiprazole (ABILIFY) 5 MG tablet; Take 1 tablet (5 mg total) by mouth daily.  Dispense: 30 tablet; Refill: 2   Follow up in 3 months Follow up with therapy  Salley Slaughter, NP 11/28/2020, 3:29 PM

## 2020-11-30 ENCOUNTER — Ambulatory Visit (HOSPITAL_COMMUNITY): Payer: No Payment, Other | Admitting: Licensed Clinical Social Worker

## 2020-12-15 ENCOUNTER — Encounter: Payer: Self-pay | Admitting: Physician Assistant

## 2020-12-29 ENCOUNTER — Ambulatory Visit (HOSPITAL_COMMUNITY): Payer: No Payment, Other | Admitting: Licensed Clinical Social Worker

## 2021-01-02 ENCOUNTER — Ambulatory Visit: Payer: Self-pay | Admitting: Physician Assistant

## 2021-01-09 ENCOUNTER — Telehealth (HOSPITAL_COMMUNITY): Payer: No Payment, Other | Admitting: Psychiatry

## 2021-01-25 ENCOUNTER — Ambulatory Visit: Payer: Self-pay | Admitting: Gastroenterology

## 2021-02-22 ENCOUNTER — Encounter (HOSPITAL_COMMUNITY): Payer: Self-pay | Admitting: Psychiatry

## 2021-02-22 ENCOUNTER — Other Ambulatory Visit: Payer: Self-pay

## 2021-02-22 ENCOUNTER — Telehealth (INDEPENDENT_AMBULATORY_CARE_PROVIDER_SITE_OTHER): Payer: No Payment, Other | Admitting: Psychiatry

## 2021-02-22 DIAGNOSIS — F331 Major depressive disorder, recurrent, moderate: Secondary | ICD-10-CM

## 2021-02-22 MED ORDER — ARIPIPRAZOLE 10 MG PO TABS: 10.0000 mg | ORAL_TABLET | Freq: Every day | ORAL | 2 refills | Status: DC

## 2021-02-22 MED ORDER — FLUOXETINE HCL 40 MG PO CAPS: 40.0000 mg | ORAL_CAPSULE | Freq: Every day | ORAL | 2 refills | Status: DC

## 2021-02-22 NOTE — Progress Notes (Signed)
[place] MD/PA/NP OP Progress Note Virtual Visit via Telephone Note  I connected with Toni Alvarado on 02/22/21 at  3:00 PM EDT by telephone and verified that I am speaking with the correct person using two identifiers.  Location: Patient: home Provider: Clinic   I discussed the limitations, risks, security and privacy concerns of performing an evaluation and management service by telephone and the availability of in person appointments. I also discussed with the patient that there may be a patient responsible charge related to this service. The patient expressed understanding and agreed to proceed.   I provided 30 minutes of non-face-to-face time during this encounter.       02/22/2021 1:58 PM Toni Alvarado  MRN:  [id number]  Chief Complaint:  "Im still feeling depressed"  HPI: 55 year old female seen today for follow up psychiatric evaluation.  She has a psychiatric history of anxiety, PTSD, and depression.  She is currently managed on Prozac 20 mg daily and Abilify 5 mg daily.  She notes that her medications are somewhat effective in managing her psychiatric conditions.   Today she was unable to logon virtually so assessment done over the phone.  During exam she was pleasant, cooperative, and engaged in conversation.  Patient notes that she is still depressed.  She informed Probation officer that she is growing weary due to her depression.  She informed Probation officer that most days all she wants to do is go to  beds.  She  also notes that she goes to school and then returns to bed.  She also notes that she is concerned about her 62 year old son who has been chronically ill since he was 34.  Patient informed Probation officer that her car recently broke down.  She notes that she is borrowing her fianc's car while she tries to save for another car.  She also informed Probation officer that she is not as financially stable as she would like to be and notes that she is looking for a new position.   Patient informed writer that the above  exacerbates her anxiety and depression.  Today provider conducted a GAD-7 and patient scored a 7, at her last visit she scored a 4.  Provider also conducted a PHQ-9 and patient scored a 20, at her last visit she scored a 19.  She notes that she sleeps 8 or more hours nightly. She reports that her appetite has increased and reports that she has gained 5-8 pounds.  Today she denies SI/HI/VAH, mania, or paranoia.    Patient notes that she no longer has back pain.  She notes that she went to a spine specialist and her back pain is well managed.  Today patient agreeable to increase Prozac 20 mg  to 40 mg daily to help manage symptoms of anxiety and depression.  She is also agreeable to increase Abilify 5 mg to 10 mg  help manage depression.  She will follow up with outpatient counseling for therapy.  No other concerns noted at this time.  Visit Diagnosis:    ICD-10-CM   1. Moderate episode of recurrent major depressive disorder (HCC)  F33.1 FLUoxetine (PROZAC) 40 MG capsule    ARIPiprazole (ABILIFY) 10 MG tablet      Past Psychiatric History: anxiety, PTSD, and depression.   Past Medical History:  Past Medical History:  Diagnosis Date   Anemia    ANEMIA, B12 DEFICIENCY 05/01/2007   Qualifier: Diagnosis of  By: Arnoldo Morale MD, Balinda Quails    Anxiety    B12 deficiency  Depression    DEPRESSION 04/08/2007   Qualifier: Diagnosis of  By: Jimmye Norman, LPN, Winfield Cunas    DYSLIPIDEMIA 10/06/2007   Qualifier: Diagnosis of  By: Lurlean Nanny LPN, [place]     Generalized anxiety disorder 05/13/2014   INTERNAL HEMORRHOIDS 10/06/2007   Qualifier: Diagnosis of  By: Lurlean Nanny LPN, Regina     Irritable bowel syndrome 10/06/2007   Qualifier: Diagnosis of  By: Lurlean Nanny LPN, [place] 10/06/2007   Qualifier: Diagnosis of  By: Lurlean Nanny LPN, Donzetta Starch, HX OF 04/08/2007   Qualifier: Diagnosis of  By: Jimmye Norman, LPN, Winfield Cunas    OBESITY 10/06/2007   Qualifier: Diagnosis of  By: Lurlean Nanny LPN, Regina     OSTEOPENIA 04/08/2007    Qualifier: Diagnosis of  By: Jimmye Norman, LPN, Winfield Cunas    PTSD (post-traumatic stress disorder) 06/06/2018   RECTAL BLEEDING 10/06/2007   Qualifier: Diagnosis of  By: Lurlean Nanny LPN, Regina     Severe episode of recurrent major depressive disorder ([place]) 05/13/2014   Suicidal ideation 05/12/2014    Past Surgical History:  Procedure Laterality Date   LITHOTRIPSY      Family Psychiatric History: mother depression and alcohol use, father alcohol use and bipolar disorder   Family History:       Family History     Family History:  Family History  Problem Relation Age of Onset   Hyperlipidemia Mother    Mental illness Mother    Alcohol abuse Mother    Depression Mother    Physical abuse Mother    Diabetes Father    Heart disease Father    Hyperlipidemia Father    Stroke Father    Mental illness Father    Alcohol abuse Father    Bipolar disorder Father    Mental illness Brother    Cancer Brother    ADD / ADHD Brother    Alcohol abuse Brother    Bipolar disorder Brother    Sexual abuse Brother    Depression Daughter        good with Cymbalta   Alcohol abuse Maternal Grandfather    Alcohol abuse Paternal Grandfather    Breast cancer Sister    Alcohol abuse Maternal Aunt    Anxiety disorder Maternal Aunt    Depression Maternal Aunt    Drug abuse Maternal Aunt    Alcohol abuse Maternal Uncle     Social History:  Social History   Socioeconomic History   Marital status: Legally Separated    Spouse name: Not on file   Number of children: Not on file   Years of education: Not on file   Highest education level: Not on file  Occupational History   Not on file  Tobacco Use   Smoking status: Every Day    Packs/day: 0.50    Pack years: 0.00    Types: Cigarettes   Smokeless tobacco: Never   Tobacco comments:    working to stop doing this again.  Was quit for 10 years  Substance and Sexual Activity   Alcohol use: Yes    Comment: Occasional use but did drink some the previous  week to help wiht anxitety per patient  report    Drug use: No   Sexual activity: Yes    Partners: Male    Birth control/protection: None  Other Topics Concern   Not on file  Social History Narrative   Not on file   Social Determinants of Health   Financial  Resource Strain: Not on file  Food Insecurity: Not on file  Transportation Needs: Not on file  Physical Activity: Not on file  Stress: Not on file  Social Connections: Not on file    Allergies: No Known Allergies  Metabolic Disorder Labs: No results found for: HGBA1C, MPG No results found for: PROLACTIN Lab Results  Component Value Date   CHOL 293 (HH) 01/02/2007   TRIG 249 (HH) 01/02/2007   HDL 42.5 01/02/2007   CHOLHDL 6.9 CALC 01/02/2007   VLDL 50 (H) 01/02/2007   Lab Results  Component Value Date   TSH 2.180 05/13/2014   TSH 1.49 08/08/2006    Therapeutic Level Labs: No results found for: LITHIUM No results found for: VALPROATE No components found for:  CBMZ  Current Medications: Current Outpatient Medications  Medication Sig Dispense Refill   ARIPiprazole (ABILIFY) 10 MG tablet Take 1 tablet (10 mg total) by mouth daily. 30 tablet 2   calcium-vitamin D (OSCAL WITH D) 500-200 MG-UNIT per tablet Take 1 tablet by mouth 2 (two) times daily. For bone health (Patient not taking: Reported on 05/31/2020) 30 tablet 0   FLUoxetine (PROZAC) 40 MG capsule Take 1 capsule (40 mg total) by mouth daily. 30 capsule 2   No current facility-administered medications for this visit.     Musculoskeletal: Strength & Muscle Tone:  Unable to assess due to telephone visit Gait & Station:  Unable to assess due to telehphonevisit Patient leans: N/A  Psychiatric Specialty Exam: Review of Systems  Last menstrual period 05/14/2014.There is no height or weight on file to calculate BMI.  General Appearance:  Unable to assess due to telephone visit  Eye Contact:   Unable to assess due to telephone visit  Speech:  Clear and  Coherent and Slow  Volume:  Normal  Mood:  Depressed  Affect:  Appropriate and Congruent  Thought Process:  Coherent, Goal Directed and Linear  Orientation:  Full (Time, Place, and Person)  Thought Content: WDL and Logical   Suicidal Thoughts:  No  Homicidal Thoughts:  No  Memory:  Immediate;   Good Recent;   Good Remote;   Good  Judgement:  Good  Insight:  Good  Psychomotor Activity:  Normal  Concentration:  Concentration: Good and Attention Span: Good  Recall:  Good  Fund of Knowledge: Good  Language: Good  Akathisia:   Unable to assess due to telehealth phone visit  Handed:  Right  AIMS (if indicated): Not done  Assets:  Communication Skills Desire for Improvement Financial Resources/Insurance Housing Social Support  ADL's:  Intact  Cognition: WNL  Sleep:  Good   Screenings: AUDIT    Flowsheet Row Admission (Discharged) from 05/12/2014 in [facility]  Alcohol Use Disorder Identification Test Final Score (AUDIT) 0      GAD-7    Flowsheet Row Video Visit from 02/22/2021 in [facility] Video Visit from 11/28/2020 in [facility] Video Visit from 10/12/2020 in [facility] Video Visit from 09/01/2020 in [facility] Counselor from 11/08/2017 in [place]  Total GAD-7 Score 7 4 0 8 3      PHQ2-9    Flowsheet Row Video Visit from 02/22/2021 in [facility] Video Visit from 11/28/2020 in [facility] Video Visit from 10/12/2020 in [facility] Video Visit from 09/01/2020 in [facility] Counselor from  11/08/2017 in [place]  PHQ-2 Total Score 6 6 0 6 0  PHQ-9 Total Score 20 19 2 21 7       Flowsheet Row Video Visit from 11/28/2020 in [facility] Video Visit from 10/12/2020 in [place] No Risk No Risk        Assessment and Plan: Patient endorses increased depression and mild anxiety. Today patient agreeable to increase Prozac 20 mg  to 40 mg daily to help manage symptoms of anxiety and depression.  She is also agreeable to increase Abilify 5 mg to 10 mg  help manage depression.  She will follow up with outpatient counseling for therap 1. Moderate episode of recurrent major depressive disorder (HCC) Increased- FLUoxetine (PROZAC) 40 MG capsule; Take 1 capsule (40 mg total) by mouth daily.  Dispense: 30 capsule; Refill: 2 Increased- ARIPiprazole (ABILIFY) 10 MG tablet; Take 1 tablet (10 mg total) by mouth daily.  Dispense: 30 tablet; Refill: 2   Follow up in 3 months Follow up with therapy  Salley Slaughter, NP 02/22/2021, 1:58 PM

## 2021-02-24 ENCOUNTER — Other Ambulatory Visit: Payer: Self-pay

## 2021-03-24 ENCOUNTER — Encounter: Payer: Self-pay | Admitting: Nurse Practitioner

## 2021-03-24 ENCOUNTER — Ambulatory Visit: Payer: BC Managed Care – PPO | Admitting: Nurse Practitioner

## 2021-03-24 VITALS — BP 118/66 | HR 70 | Ht 63.5 in | Wt 207.0 lb

## 2021-03-24 DIAGNOSIS — Z8371 Family history of colonic polyps: Secondary | ICD-10-CM

## 2021-03-24 DIAGNOSIS — Z1211 Encounter for screening for malignant neoplasm of colon: Secondary | ICD-10-CM

## 2021-03-24 DIAGNOSIS — R197 Diarrhea, unspecified: Secondary | ICD-10-CM | POA: Diagnosis not present

## 2021-03-24 DIAGNOSIS — Z800 Family history of malignant neoplasm of digestive organs: Secondary | ICD-10-CM

## 2021-03-24 NOTE — Patient Instructions (Addendum)
It was my pleasure to provide care to you today. Based on our discussion, I am providing you with my recommendations below:  RECOMMENDATION(S):   Call the office if urgent episodes of diarrhea persist or recur  COLONOSCOPY:   You have been scheduled for a colonoscopy. Please follow written instructions given to you at your visit today.   PREP:   Please pick up your prep supplies at the pharmacy within the next 1-3 days.  INHALERS:   If you use inhalers (even only as needed), please bring them with you on the day of your procedure.  COLONOSCOPY TIPS:  To reduce nausea and dehydration, stay well hydrated for 3-4 days prior to the exam.  To prevent skin/hemorrhoid irritation - prior to wiping, put A&Dointment or vaseline on the toilet paper. Keep a towel or pad on the bed.  BEFORE STARTING YOUR PREP, drink  64oz of clear liquids in the morning. This will help to flush the colon and will ensure you are well hydrated!!!!  NOTE - This is in addition to the fluids required for to complete your prep. Use of a flavored hard candy, such as grape Anise Salvo, can counteract some of the flavor of the prep and may prevent some nausea.   FOLLOW UP:  After your procedure, you will receive a call from my office staff regarding my recommendation for follow up.  BMI:  If you are age 31 or younger, your body mass index should be between 19-25. Your There is no height or weight on file to calculate BMI. If this is out of the aformentioned range listed, please consider follow up with your Primary Care Provider.   MY CHART:  The [place] [facility] providers would like to encourage you to use [facility] to communicate with providers for non-urgent requests or questions.  Due to long hold times on the telephone, sending your provider a message by [facility] may be a faster and more efficient way to get a response.  Please allow 48 business hours for a response.  Please remember that this is for non-urgent requests.    Thank you for trusting me with your gastrointestinal care!    Carl Best, NP

## 2021-03-24 NOTE — Progress Notes (Signed)
03/24/2021 Toni Alvarado [id number] [birth year]   CHIEF COMPLAINT: Schedule a colonoscopy  HISTORY OF PRESENT ILLNESS:  Toni Alvarado is a 55 year old female with a past medical history of anxiety, depression, arthritis, remote history of anemia in her 63s, hypercholesterolemia, kidney stones and obesity.  She presents to our office today as referred by Dr. Shon Baton for further evaluation regarding IBS symptoms and to schedule a screening colonoscopy.  She underwent a colonoscopy due to having diarrhea and rectal bleeding by Dr. Carlean Purl 06/09/2007 which was normal.  Her fianc has colon cancer which is in remission prompted her to schedule a colonoscopy at this time.  Her maternal aunt had colon cancer in her 34s.  Her sister has a history of colon polyps.  She denies having any upper or lower abdominal pain.  She typically passes a normal formed brown bowel movement most days.  However, she has infrequent blowouts described as urgent diarrhea which occurs once every few months.  No specific food triggers.  She does experience mild central lower abdominal pain during these episodes which goes away after she passes the diarrhea bowel movement.  No bloody diarrhea.  She thinks these episodes have decreased since she underwent pelvic floor exercises for urinary leakage.  Infrequent heartburn.  No dysphagia.  Infrequent NSAID use.  No other complaints at this time.  Laboratory studies 11/04/2020: Glucose 79.  BUN 12.  Creatinine 0.8.  Sodium 140.  Potassium 4.6.  Calcium 9.8.  AST 19.  ALT 22.  Alk phos 90.  Total bili 0.3.  WBC 7.98.  Hemoglobin 13.1.  Hematocrit 39.3.  Platelet 287.  Past Medical History:  Diagnosis Date   Anemia    ANEMIA, B12 DEFICIENCY 05/01/2007   Qualifier: Diagnosis of  By: Arnoldo Morale MD, John E    Anxiety    B12 deficiency    Depression    DEPRESSION 04/08/2007   Qualifier: Diagnosis of  By: Jimmye Norman, LPN, Bonnye M    DYSLIPIDEMIA 10/06/2007   Qualifier: Diagnosis of  By: Lurlean Nanny  LPN, [place]     Generalized anxiety disorder 05/13/2014   INTERNAL HEMORRHOIDS 10/06/2007   Qualifier: Diagnosis of  By: Lurlean Nanny LPN, Regina     Irritable bowel syndrome 10/06/2007   Qualifier: Diagnosis of  By: Lurlean Nanny LPN, Regina     MENORRHAGIA 10/06/2007   Qualifier: Diagnosis of  By: Lurlean Nanny LPN, Donzetta Starch, HX OF 04/08/2007   Qualifier: Diagnosis of  By: Jimmye Norman, LPN, Winfield Cunas    OBESITY 10/06/2007   Qualifier: Diagnosis of  By: Lurlean Nanny LPN, Reg[place]     OSTEOPENIA 04/08/2007   Qualifier: Diagnosis of  By: Jimmye Norman LPN, Winfield Cunas    PTSD (post-traumatic stress disorder) 06/06/2018   RECTAL BLEEDING 10/06/2007   Qualifier: Diagnosis of  By: Lurlean Nanny LPN, Reg[place]     Severe episode of recurrent major depressive disorder ([place]) 05/13/2014   Suicidal ideation 05/12/2014   Past Surgical History:  Procedure Laterality Date   LITHOTRIPSY    Renal stent 2006 or 2007.  She denies having any problems with sedation/anesthesia or airway management/intubation during any past procedure or surgery.  Social History: She smoked cigarettes off and on since her 60s, she quit smoking 20 years ago. She drinks one or two glasses of wine every 6 months. No drug use.   Family History: family history includes ADD / ADHD in her brother; Alcohol abuse in her brother, father, maternal aunt, maternal grandfather, maternal uncle, mother, and paternal grandfather; Anxiety  disorder in her maternal aunt; Bipolar disorder in her brother and father; Breast cancer in her sister; Cancer in her brother; Depression in her daughter, maternal aunt, and mother; Diabetes in her father; Drug abuse in her maternal aunt; Heart disease in her father; Hyperlipidemia in her father and mother; Mental illness in her brother, father, and mother; Physical abuse in her mother; Sexual abuse in her brother; Stroke in her father. Her maternal aunt had colon cancer in her 3s.  Her sister has a history of colon polyps. Sister with breast cancer. Brother  throat cancer. Paternal uncle died from Leukemia. Maternal and paternal grandparents had diabetes and heart disease.   No Known Allergies    Outpatient Encounter Medications as of 03/24/2021  Medication Sig   ARIPiprazole (ABILIFY) 10 MG tablet Take 1 tablet (10 mg total) by mouth daily.   Cholecalciferol (VITAMIN D-3) 5000 UNIT/ML LIQD Place under the tongue daily.   FLUoxetine (PROZAC) 40 MG capsule Take 1 capsule (40 mg total) by mouth daily.   [DISCONTINUED] gabapentin (NEURONTIN) 100 MG capsule gabapentin 100 mg capsule  TAKE 1 CAPSULE BY MOUTH AS NEEDED AT BEDTIME   [DISCONTINUED] calcium-vitamin D (OSCAL WITH D) 500-200 MG-UNIT per tablet Take 1 tablet by mouth 2 (two) times daily. For bone health (Patient not taking: Reported on 05/31/2020)   No facility-administered encounter medications on file as of 03/24/2021.    REVIEW OF SYSTEMS:  Gen: Denies fever, sweats or chills. No weight loss.  CV: Denies chest pain, palpitations or edema. Resp: Denies cough, shortness of breath of hemoptysis.  GI: See HPI. GU : + Urine leakage improved after pelvic floor physical therapy. Denies urinary burning, blood in urine, increased urinary frequency. MS: + Back pain, arthritis.  Derm: Denies rash, itchiness, skin lesions or unhealing ulcers. Psych: + Anxiety and depression Heme: Denies bruising, bleeding. Neuro:  Denies headaches, dizziness or paresthesias. Endo:  Denies any problems with DM, thyroid or adrenal function.  PHYSICAL EXAM: BP 118/66   Pulse 70   Ht 5' 3.5" (1.613 m)   Wt 207 lb (93.9 kg)   LMP 05/14/2014   BMI 36.09 kg/m  General: 55 year old female in no acute distress. Head: Normocephalic and atraumatic. Eyes:  Sclerae non-icteric, conjunctive pink. Ears: Normal auditory acuity. Mouth: Dentition intact. No ulcers or lesions.  Neck: Supple, no lymphadenopathy or thyromegaly.  Lungs: Clear bilaterally to auscultation without wheezes, crackles or rhonchi. Heart:  Regular rate and rhythm. No murmur, rub or gallop appreciated.  Abdomen: Soft, nontender, non distended. No masses. No hepatosplenomegaly. Normoactive bowel sounds x 4 quadrants.  Rectal: Deferred. Musculoskeletal: Symmetrical with no gross deformities. Skin: Warm and dry. No rash or lesions on visible extremities. Extremities: No edema. Neurological: Alert oriented x 4, no focal deficits.  Psychological:  Alert and cooperative. Normal mood and affect.  ASSESSMENT AND PLAN:  102.  55 year old female presents to schedule screening colonoscopy.  Normal colonoscopy in 2008.  Family history of colon cancer (maternal aunt) and colon polyps (sister). -Colonoscopy benefits and risks discussed including risk with sedation, risk of bleeding, perforation and infection   2.  Urgent diarrhea episodes, occurs once every few months.  No obvious triggers. -Proceed with colonoscopy to rule out microscopic colitis, unlikely IBD. -Patient to monitor episodes of urgent diarrhea, record dietary intake prior to next episodes. -Patient will call our office if her diarrhea recurs   Further recommendations to be determined after colonoscopy completed       CC:  Antony Contras, MD

## 2021-04-06 ENCOUNTER — Other Ambulatory Visit: Payer: Self-pay

## 2021-04-06 ENCOUNTER — Encounter: Payer: Self-pay | Admitting: Internal Medicine

## 2021-04-06 ENCOUNTER — Ambulatory Visit (AMBULATORY_SURGERY_CENTER): Payer: BC Managed Care – PPO | Admitting: Internal Medicine

## 2021-04-06 VITALS — BP 124/80 | HR 70 | Temp 97.3°F | Resp 16 | Ht 63.5 in | Wt 207.0 lb

## 2021-04-06 DIAGNOSIS — D123 Benign neoplasm of transverse colon: Secondary | ICD-10-CM | POA: Diagnosis not present

## 2021-04-06 DIAGNOSIS — Z86010 Personal history of colonic polyps: Secondary | ICD-10-CM | POA: Insufficient documentation

## 2021-04-06 DIAGNOSIS — Z860101 Personal history of adenomatous and serrated colon polyps: Secondary | ICD-10-CM

## 2021-04-06 DIAGNOSIS — D128 Benign neoplasm of rectum: Secondary | ICD-10-CM

## 2021-04-06 DIAGNOSIS — D120 Benign neoplasm of cecum: Secondary | ICD-10-CM

## 2021-04-06 DIAGNOSIS — Z1211 Encounter for screening for malignant neoplasm of colon: Secondary | ICD-10-CM

## 2021-04-06 HISTORY — DX: Personal history of adenomatous and serrated colon polyps: Z86.0101

## 2021-04-06 HISTORY — DX: Personal history of colonic polyps: Z86.010

## 2021-04-06 MED ORDER — SODIUM CHLORIDE 0.9 % IV SOLN: 500.0000 mL | Freq: Once | INTRAVENOUS | Status: DC

## 2021-04-06 NOTE — Progress Notes (Signed)
A and O x3. Report to RN. Tolerated MAC anesthesia well. 

## 2021-04-06 NOTE — Progress Notes (Signed)
Called to room to assist during endoscopic procedure.  Patient ID and intended procedure confirmed with present staff. Received instructions for my participation in the procedure from the performing physician.  

## 2021-04-06 NOTE — Op Note (Signed)
[facility] Patient Name: Toni Alvarado Procedure Date: 04/06/2021 1:57 PM MRN: [id number] Endoscopist: Gatha Mayer , MD Age: 55 Referring MD:  Date of Birth: 06/19/1966 Gender: Female Account #: [id number] Procedure:                Colonoscopy Indications:              Screening for colorectal malignant neoplasm Medicines:                Propofol per Anesthesia, Monitored Anesthesia Care Procedure:                Pre-Anesthesia Assessment:                           - Prior to the procedure, a History and Physical                            was performed, and patient medications and                            allergies were reviewed. The patient's tolerance of                            previous anesthesia was also reviewed. The risks                            and benefits of the procedure and the sedation                            options and risks were discussed with the patient.                            All questions were answered, and informed consent                            was obtained. Prior Anticoagulants: The patient has                            taken no previous anticoagulant or antiplatelet                            agents. ASA Grade Assessment: II - A patient with                            mild systemic disease. After reviewing the risks                            and benefits, the patient was deemed in                            satisfactory condition to undergo the procedure.                           After obtaining informed consent, the colonoscope  was passed under direct vision. Throughout the                            procedure, the patient's blood pressure, pulse, and                            oxygen saturations were monitored continuously. The                            CF [id number] was introduced through the anus                            and advanced to the the cecum, identified by                             appendiceal orifice and ileocecal valve. The                            colonoscopy was performed without difficulty. The                            patient tolerated the procedure well. The quality                            of the bowel preparation was excellent. The                            ileocecal valve, appendiceal orifice, and rectum                            were photographed. The bowel preparation used was                            Miralax via split dose instruction. Scope In: 2:09:47 PM Scope Out: 2:23:40 PM Scope Withdrawal Time: 0 hours 11 minutes 44 seconds  Total Procedure Duration: 0 hours 13 minutes 53 seconds  Findings:                 The perianal and digital rectal examinations were                            normal.                           Three sessile polyps were found in the proximal                            rectum, transverse colon and cecum. The polyps were                            2 to 8 mm in size. These polyps were removed with a                            cold snare. Resection and retrieval were complete.  Verification of patient identification for the                            specimen was done. Estimated blood loss was minimal.                           The exam was otherwise without abnormality on                            direct and retroflexion views. Complications:            No immediate complications. Estimated Blood Loss:     Estimated blood loss was minimal. Impression:               - Three 2 to 8 mm polyps in the proximal rectum, in                            the transverse colon and in the cecum, removed with                            a cold snare. Resected and retrieved.                           - The examination was otherwise normal on direct                            and retroflexion views. Recommendation:           - Patient has a contact number available for                            emergencies.  The signs and symptoms of potential                            delayed complications were discussed with the                            patient. Return to normal activities tomorrow.                            Written discharge instructions were provided to the                            patient.                           - Limit processed foods, sugary beverages,                            vegetable oils.                           - Await pathology results.                           - Repeat colonoscopy is recommended. The  colonoscopy date will be determined after pathology                            results from today's exam become available for                            review. Gatha Mayer, MD 04/06/2021 2:30:31 PM This report has been signed electronically.

## 2021-04-06 NOTE — Progress Notes (Signed)
VS  DT ? ?Pt's states no medical or surgical changes since previsit or office visit. ? ?

## 2021-04-06 NOTE — Patient Instructions (Addendum)
I found and removed 3 small polyps. All look benign though suspect they are pre-cancerous.  I will let you know pathology results and when to have another routine colonoscopy by mail and/or My Chart.  No inflammation seen.  It sounds like the diarrhea episodes are related to what you eat sometimes. I recommend you pay attention (keep a food diary) and see if you can identify the causes. Avoiding processed foods, sodas and other sugar-containg beverages (Gatorade, fruit jiuice, sweet tea etc) and vegetable oils is a good idea.  I appreciate the opportunity to care for you. Gatha Mayer, MD, [facility]  Please, read all of the handouts given to you by your recovery room nurse.  YOU HAD AN ENDOSCOPIC PROCEDURE TODAY AT [facility]:   Refer to the procedure report that was given to you for any specific questions about what was found during the examination.  If the procedure report does not answer your questions, please call your gastroenterologist to clarify.  If you requested that your care partner not be given the details of your procedure findings, then the procedure report has been included in a sealed envelope for you to review at your convenience later.  YOU SHOULD EXPECT: Some feelings of bloating in the abdomen. Passage of more gas than usual.  Walking can help get rid of the air that was put into your GI tract during the procedure and reduce the bloating. If you had a lower endoscopy (such as a colonoscopy or flexible sigmoidoscopy) you may notice spotting of blood in your stool or on the toilet paper. If you underwent a bowel prep for your procedure, you may not have a normal bowel movement for a few days.  Please Note:  You might notice some irritation and congestion in your nose or some drainage.  This is from the oxygen used during your procedure.  There is no need for concern and it should clear up in a day or so.  SYMPTOMS TO REPORT IMMEDIATELY:  Following lower  endoscopy (colonoscopy or flexible sigmoidoscopy):  Excessive amounts of blood in the stool  Significant tenderness or worsening of abdominal pains  Swelling of the abdomen that is new, acute  Fever of 100F or higher   For urgent or emergent issues, a gastroenterologist can be reached at any hour by calling [phone]. Do not use MyChart messaging for urgent concerns.    DIET:  We do recommend a small meal at first, but then you may proceed to your regular diet.  Drink plenty of fluids but you should avoid alcoholic beverages for 24 hours. Try to avoid sugary foods and vegatable oil.  ACTIVITY:  You should plan to take it easy for the rest of today and you should NOT DRIVE or use heavy machinery until tomorrow (because of the sedation medicines used during the test).    FOLLOW UP: Our staff will call the number listed on your records 48-72 hours following your procedure to check on you and address any questions or concerns that you may have regarding the information given to you following your procedure. If we do not reach you, we will leave a message.  We will attempt to reach you two times.  During this call, we will ask if you have developed any symptoms of COVID 19. If you develop any symptoms (ie: fever, flu-like symptoms, shortness of breath, cough etc.) before then, please call [phone].  If you test positive for Covid 19 in the 2 weeks  post procedure, please call and report this information to [place].    If any biopsies were taken you will be contacted by phone or by letter within the next 1-3 weeks.  Please call us at [phone] if you have not heard about the biopsies in 3 weeks.    SIGNATURES/CONFIDENTIALITY: You and/or your care partner have signed paperwork which will be entered into your electronic medical record.  These signatures attest to the fact that that the information above on your After Visit Summary has been reviewed and is understood.  Full responsibility of  the confidentiality of this discharge information lies with you and/or your care-partner.

## 2021-04-06 NOTE — Progress Notes (Signed)
[facility] History and Physical   Primary Care Physician:  Antony Contras, MD   Reason for Procedure:   Colon cancer screening  Plan:    colonoscoopy     HPI: Disney Goguen is a 55 y.o. female here for a screening colonoscopy. She has infrequent diarrhea epsiodes.   Past Medical History:  Diagnosis Date   Anemia    ANEMIA, B12 DEFICIENCY 05/01/2007   Qualifier: Diagnosis of  By: Arnoldo Morale MD, John E    Anxiety    B12 deficiency    Depression    DEPRESSION 04/08/2007   Qualifier: Diagnosis of  By: Jimmye Norman, LPN, Bonnye M    DYSLIPIDEMIA 10/06/2007   Qualifier: Diagnosis of  By: Lurlean Nanny LPN, Regina     Generalized anxiety disorder 05/13/2014   INTERNAL HEMORRHOIDS 10/06/2007   Qualifier: Diagnosis of  By: Lurlean Nanny LPN, Regina     Irritable bowel syndrome 10/06/2007   Qualifier: Diagnosis of  By: Lurlean Nanny LPN, Regina     MENORRHAGIA 10/06/2007   Qualifier: Diagnosis of  By: Lurlean Nanny LPN, Donzetta Starch, HX OF 04/08/2007   Qualifier: Diagnosis of  By: Jimmye Norman, LPN, Winfield Cunas    OBESITY 10/06/2007   Qualifier: Diagnosis of  By: Lurlean Nanny LPN, Regina     OSTEOPENIA 04/08/2007   Qualifier: Diagnosis of  By: Jimmye Norman LPN, Winfield Cunas    PTSD (post-traumatic stress disorder) 06/06/2018   RECTAL BLEEDING 10/06/2007   Qualifier: Diagnosis of  By: Lurlean Nanny LPN, Regina     Severe episode of recurrent major depressive disorder ([place]) 05/13/2014   Suicidal ideation 05/12/2014    Past Surgical History:  Procedure Laterality Date   COLONOSCOPY     LITHOTRIPSY      Prior to Admission medications   Medication Sig Start Date End Date Taking? Authorizing Provider  ARIPiprazole (ABILIFY) 10 MG tablet Take 1 tablet (10 mg total) by mouth daily. 02/22/21  Yes Eulis Canner E, NP  Cholecalciferol (VITAMIN D-3) 5000 UNIT/ML LIQD Place under the tongue daily.   Yes [provider]  FLUoxetine (PROZAC) 40 MG capsule Take 1 capsule (40 mg total) by mouth daily. 02/22/21  Yes Salley Slaughter, NP     Current Outpatient Medications  Medication Sig Dispense Refill   ARIPiprazole (ABILIFY) 10 MG tablet Take 1 tablet (10 mg total) by mouth daily. 30 tablet 2   Cholecalciferol (VITAMIN D-3) 5000 UNIT/ML LIQD Place under the tongue daily.     FLUoxetine (PROZAC) 40 MG capsule Take 1 capsule (40 mg total) by mouth daily. 30 capsule 2   Current Facility-Administered Medications  Medication Dose Route Frequency Provider Last Rate Last Admin   0.9 %  sodium chloride infusion  500 mL Intravenous Once Gatha Mayer, MD        Allergies as of 04/06/2021   (No Known Allergies)    Family History  Problem Relation Age of Onset   Hyperlipidemia Mother    Mental illness Mother    Alcohol abuse Mother    Depression Mother    Physical abuse Mother    Diabetes Father    Heart disease Father    Hyperlipidemia Father    Stroke Father    Mental illness Father    Alcohol abuse Father    Bipolar disorder Father    Breast cancer Sister    Mental illness Brother    Cancer Brother    ADD / ADHD Brother    Alcohol abuse Brother    Bipolar disorder Brother  Sexual abuse Brother    Colon cancer Maternal Aunt    Alcohol abuse Maternal Aunt    Anxiety disorder Maternal Aunt    Depression Maternal Aunt    Drug abuse Maternal Aunt    Alcohol abuse Maternal Uncle    Alcohol abuse Maternal Grandfather    Alcohol abuse Paternal Grandfather    Depression Daughter        good with Cymbalta   Esophageal cancer Neg Hx    Rectal cancer Neg Hx    Stomach cancer Neg Hx     Social History   Socioeconomic History   Marital status: Legally Separated    Spouse name: Not on file   Number of children: Not on file   Years of education: Not on file   Highest education level: Not on file  Occupational History   Not on file  Tobacco Use   Smoking status: Former    Packs/day: 0.50    Types: Cigarettes    Quit date: 03/27/2001    Years since quitting: 20.0   Smokeless tobacco: Never   Tobacco  comments:    working to stop doing this again.  Was quit for 10 years  Vaping Use   Vaping Use: Never used  Substance and Sexual Activity   Alcohol use: Yes    Comment: Occasional use but did drink some the previous week to help wiht anxitety per patient  report    Drug use: No   Sexual activity: Yes    Partners: Male    Birth control/protection: None  Other Topics Concern   Not on file  Social History Narrative   Not on file   Social Determinants of Health   Financial Resource Strain: Not on file  Food Insecurity: Not on file  Transportation Needs: Not on file  Physical Activity: Not on file  Stress: Not on file  Social Connections: Not on file  Intimate Partner Violence: Not on file    Review of Systems:  All other review of systems negative except as mentioned in the HPI.  Physical Exam: Vital signs BP 133/78   Pulse 72   Temp (!) 97.3 F (36.3 C) (Temporal)   Resp 17   Ht 5' 3.5" (1.613 m)   Wt 207 lb (93.9 kg)   LMP 05/14/2014   SpO2 99%   BMI 36.09 kg/m   General:   Alert,  Well-developed, well-nourished, pleasant and cooperative in NAD Lungs:  Clear throughout to auscultation.   Heart:  Regular rate and rhythm; no murmurs, clicks, rubs,  or gallops. Abdomen:  Soft, nontender and nondistended. Normal bowel sounds.   Neuro/Psych:  Alert and cooperative. Normal mood and affect. A and O x 3   '@Taliesin Hartlage'$  Simonne Maffucci, MD, [facility] [phone] (pager) 04/06/2021 2:00 PM@

## 2021-04-07 ENCOUNTER — Encounter: Payer: BC Managed Care – PPO | Admitting: Internal Medicine

## 2021-04-10 ENCOUNTER — Telehealth: Payer: Self-pay

## 2021-04-10 ENCOUNTER — Telehealth: Payer: Self-pay | Admitting: *Deleted

## 2021-04-10 NOTE — Telephone Encounter (Signed)
Left message on follow up call. 

## 2021-04-10 NOTE — Telephone Encounter (Signed)
Left message on f/u call 

## 2021-04-24 ENCOUNTER — Encounter: Payer: Self-pay | Admitting: Internal Medicine

## 2021-05-02 ENCOUNTER — Telehealth (INDEPENDENT_AMBULATORY_CARE_PROVIDER_SITE_OTHER): Payer: BC Managed Care – PPO | Admitting: Psychiatry

## 2021-05-02 ENCOUNTER — Other Ambulatory Visit: Payer: Self-pay

## 2021-05-02 ENCOUNTER — Encounter (HOSPITAL_COMMUNITY): Payer: Self-pay | Admitting: Psychiatry

## 2021-05-02 DIAGNOSIS — F331 Major depressive disorder, recurrent, moderate: Secondary | ICD-10-CM | POA: Diagnosis not present

## 2021-05-02 MED ORDER — FLUOXETINE HCL 40 MG PO CAPS: 40.0000 mg | ORAL_CAPSULE | Freq: Every day | ORAL | 3 refills | Status: DC

## 2021-05-02 MED ORDER — ARIPIPRAZOLE 10 MG PO TABS: 10.0000 mg | ORAL_TABLET | Freq: Every day | ORAL | 3 refills | Status: DC

## 2021-05-02 NOTE — Progress Notes (Signed)
BH MD/PA/NP OP Progress Note Virtual Visit via Video Note  I connected with Toni Alvarado on 05/02/21 at  4:00 PM EDT by a video enabled telemedicine application and verified that I am speaking with the correct person using two identifiers.  Location: Patient: Home Provider: Clinic   I discussed the limitations of evaluation and management by telemedicine and the availability of in person appointments. The patient expressed understanding and agreed to proceed.  I provided 30 minutes of non-face-to-face time during this encounter.         05/02/2021 4:14 PM Toni Alvarado  MRN:  [id number]  Chief Complaint:  "I have been doing pretty good. I have just been just adjusting to being back in school."  HPI: 55 year old female seen today for follow up psychiatric evaluation.  She has a psychiatric history of anxiety, PTSD, and depression.  She is currently managed on Prozac 40 mg daily and Abilify 10 mg daily.  She notes that her medications are effective in managing her psychiatric conditions.   Today she was well groomed, pleasant, cooperative, and engaged in conversation. She informed Probation officer that she has been doing pretty good.  She notes that she is adjusting to being back in school and waking up early.  Patient notes that her anxiety and her depression has improved since her last visit.  Provider conducted a GAD-7 and patient scored a 0, at her last visit she scored a 7.  Provider also conducted PHQ-9 and patient scored a 3, at her last visit she scored a 20.  She endorses adequate sleep and appetite.  Today she denies SI/HI/VAH, mania, or paranoia.    Patient notes that she has mild pain in her left arm.  She notes that she may be going to physical therapy to help manage this pain.    No medication changes made today.  Patient agreeable to continue medications as prescribed.  She will follow up with outpatient counseling for therapy.  No other concerns noted at this time.  Visit Diagnosis:     ICD-10-CM   1. Moderate episode of recurrent major depressive disorder (HCC)  F33.1 ARIPiprazole (ABILIFY) 10 MG tablet    FLUoxetine (PROZAC) 40 MG capsule      Past Psychiatric History: anxiety, PTSD, and depression.   Past Medical History:  Past Medical History:  Diagnosis Date   Anemia    ANEMIA, B12 DEFICIENCY 05/01/2007   Qualifier: Diagnosis of  By: Arnoldo Morale MD, John E    Anxiety    B12 deficiency    Depression    DEPRESSION 04/08/2007   Qualifier: Diagnosis of  By: Jimmye Norman, LPN, Bonnye M    DYSLIPIDEMIA 10/06/2007   Qualifier: Diagnosis of  By: Lurlean Nanny LPN, Regina     Generalized anxiety disorder 05/13/2014   Hx of adenomatous polyp of colon 04/06/2021   single adenoma < 1 cm   INTERNAL HEMORRHOIDS 10/06/2007   Qualifier: Diagnosis of  By: Lurlean Nanny LPN, Regina     Irritable bowel syndrome 10/06/2007   Qualifier: Diagnosis of  By: Lurlean Nanny LPN, Regina     MENORRHAGIA 10/06/2007   Qualifier: Diagnosis of  By: Lurlean Nanny LPN, Donzetta Starch, HX OF 04/08/2007   Qualifier: Diagnosis of  By: Jimmye Norman LPN, Winfield Cunas    OBESITY 10/06/2007   Qualifier: Diagnosis of  By: Lurlean Nanny LPN, Regina     OSTEOPENIA 04/08/2007   Qualifier: Diagnosis of  By: Jimmye Norman LPN, Winfield Cunas    PTSD (post-traumatic stress disorder) 06/06/2018  RECTAL BLEEDING 10/06/2007   Qualifier: Diagnosis of  By: Lurlean Nanny LPN, Regina     Severe episode of recurrent major depressive disorder ([place]) 05/13/2014   Suicidal ideation 05/12/2014    Past Surgical History:  Procedure Laterality Date   COLONOSCOPY     LITHOTRIPSY      Family Psychiatric History: mother depression and alcohol use, father alcohol use and bipolar disorder   Family History:       Family History     Family History:  Family History  Problem Relation Age of Onset   Hyperlipidemia Mother    Mental illness Mother    Alcohol abuse Mother    Depression Mother    Physical abuse Mother    Diabetes Father    Heart disease Father     Hyperlipidemia Father    Stroke Father    Mental illness Father    Alcohol abuse Father    Bipolar disorder Father    Breast cancer Sister    Mental illness Brother    Cancer Brother    ADD / ADHD Brother    Alcohol abuse Brother    Bipolar disorder Brother    Sexual abuse Brother    Colon cancer Maternal Aunt    Alcohol abuse Maternal Aunt    Anxiety disorder Maternal Aunt    Depression Maternal Aunt    Drug abuse Maternal Aunt    Alcohol abuse Maternal Uncle    Alcohol abuse Maternal Grandfather    Alcohol abuse Paternal Grandfather    Depression Daughter        good with Cymbalta   Esophageal cancer Neg Hx    Rectal cancer Neg Hx    Stomach cancer Neg Hx     Social History:  Social History   Socioeconomic History   Marital status: Legally Separated    Spouse name: Not on file   Number of children: Not on file   Years of education: Not on file   Highest education level: Not on file  Occupational History   Not on file  Tobacco Use   Smoking status: Former    Packs/day: 0.50    Types: Cigarettes    Quit date: 03/27/2001    Years since quitting: 20.1   Smokeless tobacco: Never   Tobacco comments:    working to stop doing this again.  Was quit for 10 years  Vaping Use   Vaping Use: Never used  Substance and Sexual Activity   Alcohol use: Yes    Comment: Occasional use but did drink some the previous week to help wiht anxitety per patient  report    Drug use: No   Sexual activity: Yes    Partners: Male    Birth control/protection: None  Other Topics Concern   Not on file  Social History Narrative   Not on file   Social Determinants of Health   Financial Resource Strain: Not on file  Food Insecurity: Not on file  Transportation Needs: Not on file  Physical Activity: Not on file  Stress: Not on file  Social Connections: Not on file    Allergies: No Known Allergies  Metabolic Disorder Labs: No results found for: HGBA1C, MPG No results found for:  PROLACTIN Lab Results  Component Value Date   CHOL 293 (HH) 01/02/2007   TRIG 249 (HH) 01/02/2007   HDL 42.5 01/02/2007   CHOLHDL 6.9 CALC 01/02/2007   VLDL 50 (H) 01/02/2007   Lab Results  Component Value Date   TSH  2.180 05/13/2014   TSH 1.49 08/08/2006    Therapeutic Level Labs: No results found for: LITHIUM No results found for: VALPROATE No components found for:  CBMZ  Current Medications: Current Outpatient Medications  Medication Sig Dispense Refill   ARIPiprazole (ABILIFY) 10 MG tablet Take 1 tablet (10 mg total) by mouth daily. 30 tablet 3   Cholecalciferol (VITAMIN D-3) 5000 UNIT/ML LIQD Place under the tongue daily.     FLUoxetine (PROZAC) 40 MG capsule Take 1 capsule (40 mg total) by mouth daily. 30 capsule 3   No current facility-administered medications for this visit.     Musculoskeletal: Strength & Muscle Tone:  Unable to assess due to telehealth visit [place]:  Unable to assess due to telehealth visit Patient leans: N/A  Psychiatric Specialty Exam: Review of Systems  Last menstrual period 05/14/2014.There is no height or weight on file to calculate BMI.  General Appearance: Well Groomed  Eye Contact:  Good  Speech:  Clear and Coherent and Normal Rate  Volume:  Normal  Mood:  Euthymic  Affect:  Appropriate and Congruent  Thought Process:  Coherent, Goal Directed and Linear  Orientation:  Full (Time, Place, and Person)  Thought Content: WDL and Logical   Suicidal Thoughts:  No  Homicidal Thoughts:  No  Memory:  Immediate;   Good Recent;   Good Remote;   Good  Judgement:  Good  Insight:  Good  Psychomotor Activity:  Normal  Concentration:  Concentration: Good and Attention Span: Good  Recall:  Good  Fund of Knowledge: Good  Language: Good  Akathisia:   Unable to assess due to telehealth phone visit  Handed:  Right  AIMS (if indicated): Not done  Assets:  Communication Skills Desire for Improvement Financial  Resources/Insurance Housing Social Support  ADL's:  Intact  Cognition: WNL  Sleep:  Good   Screenings: AUDIT    Flowsheet Row Admission (Discharged) from 05/12/2014 in [facility]  Alcohol Use Disorder Identification Test Final Score (AUDIT) 0      GAD-7    Flowsheet Row Video Visit from 05/02/2021 in [facility] Video Visit from 02/22/2021 in [facility] Video Visit from 11/28/2020 in [facility] Video Visit from 10/12/2020 in [facility] Video Visit from 09/01/2020 in [facility]  Total GAD-7 Score 0 7 4 0 8      PHQ2-9    Flowsheet Row Video Visit from 05/02/2021 in [facility] Video Visit from 02/22/2021 in [facility] Video Visit from 11/28/2020 in [facility] Video Visit from 10/12/2020 in [facility] Video Visit from 09/01/2020 in [facility]  PHQ-2 Total Score '1 6 6 '$ 0 6  PHQ-9 Total Score '3 20 19 2 21      '$ Flowsheet Row Video Visit from 11/28/2020 in [facility] Video Visit from 10/12/2020 in [facility] No Risk No Risk        Assessment and Plan: Notes that she is doing well on her current medication regimen.  No medication changes made today.  Patient agreeable to continue medications as prescribed.   1. Moderate episode of recurrent major depressive disorder (HCC) Continue- FLUoxetine (PROZAC) 40 MG capsule; Take 1 capsule (40 mg total) by mouth daily.  Dispense: 30 capsule; Refill: 2 Continue-  ARIPiprazole (ABILIFY) 10 MG tablet; Take 1 tablet (10 mg total) by mouth daily.  Dispense: 30 tablet; Refill: 2   Follow up in 3 months Follow up with therapy  Salley Slaughter,  NP 05/02/2021, 4:14 PM

## 2021-08-01 ENCOUNTER — Telehealth (INDEPENDENT_AMBULATORY_CARE_PROVIDER_SITE_OTHER): Payer: BC Managed Care – PPO | Admitting: Psychiatry

## 2021-08-01 ENCOUNTER — Encounter (HOSPITAL_COMMUNITY): Payer: Self-pay | Admitting: Psychiatry

## 2021-08-01 DIAGNOSIS — F331 Major depressive disorder, recurrent, moderate: Secondary | ICD-10-CM

## 2021-08-01 MED ORDER — ARIPIPRAZOLE 10 MG PO TABS: 10.0000 mg | ORAL_TABLET | Freq: Every day | ORAL | 3 refills | Status: DC

## 2021-08-01 MED ORDER — FLUOXETINE HCL 40 MG PO CAPS: 40.0000 mg | ORAL_CAPSULE | Freq: Every day | ORAL | 3 refills | Status: DC

## 2021-08-01 NOTE — Progress Notes (Signed)
BH MD/PA/NP OP Progress Note Virtual Visit via Video Note  I connected with Toni Alvarado on 08/01/21 at  4:00 PM EST by a video enabled telemedicine application and verified that I am speaking with the correct person using two identifiers.  Location: Patient: Home Provider: Clinic   I discussed the limitations of evaluation and management by telemedicine and the availability of in person appointments. The patient expressed understanding and agreed to proceed.  I provided 30 minutes of non-face-to-face time during this encounter.         08/01/2021 4:12 PM Toni Alvarado  MRN:  [id number]  Chief Complaint:  "I have been doing pretty good"  HPI: 55 year old female seen today for follow up psychiatric evaluation.  She has a psychiatric history of anxiety, PTSD, and depression.  She is currently managed on Prozac 40 mg daily and Abilify 10 mg daily.  She notes that her medications are effective in managing her psychiatric conditions.   Today she was well groomed, pleasant, cooperative, and engaged in conversation. She informed Probation officer that she has been doing pretty good.  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 She informed Probation officer that she enjoys her position. Patient notes that she continues to have minimal anxiety and depression.  Provider conducted a GAD-7 and patient scored a 0, at her last visit she scored a 0.  Provider also conducted PHQ-9 and patient scored a 1, at her last visit she scored a 3.  She endorses adequate sleep and appetite.  Today she denies SI/HI/VAH, mania, or paranoia.    No medication changes made today.  Patient agreeable to continue medications as prescribed.  She will follow up with outpatient counseling for therapy.  No other concerns noted at this time.  Visit Diagnosis:    ICD-10-CM   1. Moderate episode of recurrent major depressive disorder (HCC)  F33.1 ARIPiprazole (ABILIFY) 10 MG tablet    FLUoxetine (PROZAC) 40 MG capsule       Past Psychiatric History: anxiety, PTSD, and depression.   Past Medical History:  Past Medical History:  Diagnosis Date   Anemia    ANEMIA, B12 DEFICIENCY 05/01/2007   Qualifier: Diagnosis of  By: Arnoldo Morale MD, John E    Anxiety    B12 deficiency    Depression    DEPRESSION 04/08/2007   Qualifier: Diagnosis of  By: Jimmye Norman, LPN, Bonnye M    DYSLIPIDEMIA 10/06/2007   Qualifier: Diagnosis of  By: Lurlean Nanny LPN, Regina     Generalized anxiety disorder 05/13/2014   Hx of adenomatous polyp of colon 04/06/2021   single adenoma < 1 cm   INTERNAL HEMORRHOIDS 10/06/2007   Qualifier: Diagnosis of  By: Lurlean Nanny LPN, Regina     Irritable bowel syndrome 10/06/2007   Qualifier: Diagnosis of  By: Lurlean Nanny LPN, Regina     MENORRHAGIA 10/06/2007   Qualifier: Diagnosis of  By: Lurlean Nanny LPN, Donzetta Starch, HX OF 04/08/2007   Qualifier: Diagnosis of  By: Jimmye Norman LPN, Winfield Cunas    OBESITY 10/06/2007   Qualifier: Diagnosis of  By: Lurlean Nanny LPN, Regina     OSTEOPENIA 04/08/2007   Qualifier: Diagnosis of  By: Jimmye Norman LPN, Winfield Cunas    PTSD (post-traumatic stress disorder) 06/06/2018   RECTAL BLEEDING 10/06/2007   Qualifier: Diagnosis of  By: Lurlean Nanny LPN, Regina     Severe episode of recurrent major depressive disorder ([place]) 05/13/2014   Suicidal ideation 05/12/2014    Past Surgical History:  Procedure  Laterality Date   COLONOSCOPY     LITHOTRIPSY      Family Psychiatric History: mother depression and alcohol use, father alcohol use and bipolar disorder   Family History:       Family History     Family History:  Family History  Problem Relation Age of Onset   Hyperlipidemia Mother    Mental illness Mother    Alcohol abuse Mother    Depression Mother    Physical abuse Mother    Diabetes Father    Heart disease Father    Hyperlipidemia Father    Stroke Father    Mental illness Father    Alcohol abuse Father    Bipolar disorder Father    Breast cancer Sister    Mental illness  Brother    Cancer Brother    ADD / ADHD Brother    Alcohol abuse Brother    Bipolar disorder Brother    Sexual abuse Brother    Colon cancer Maternal Aunt    Alcohol abuse Maternal Aunt    Anxiety disorder Maternal Aunt    Depression Maternal Aunt    Drug abuse Maternal Aunt    Alcohol abuse Maternal Uncle    Alcohol abuse Maternal Grandfather    Alcohol abuse Paternal Grandfather    Depression Daughter        good with Cymbalta   Esophageal cancer Neg Hx    Rectal cancer Neg Hx    Stomach cancer Neg Hx     Social History:  Social History   Socioeconomic History   Marital status: Legally Separated    Spouse name: Not on file   Number of children: Not on file   Years of education: Not on file   Highest education level: Not on file  Occupational History   Not on file  Tobacco Use   Smoking status: Former    Packs/day: 0.50    Types: Cigarettes    Quit date: 03/27/2001    Years since quitting: 20.3   Smokeless tobacco: Never   Tobacco comments:    working to stop doing this again.  Was quit for 10 years  Vaping Use   Vaping Use: Never used  Substance and Sexual Activity   Alcohol use: Yes    Comment: Occasional use but did drink some the previous week to help wiht anxitety per patient  report    Drug use: No   Sexual activity: Yes    Partners: Male    Birth control/protection: None  Other Topics Concern   Not on file  Social History Narrative   Not on file   Social Determinants of Health   Financial Resource Strain: Not on file  Food Insecurity: Not on file  Transportation Needs: Not on file  Physical Activity: Not on file  Stress: Not on file  Social Connections: Not on file    Allergies: No Known Allergies  Metabolic Disorder Labs: No results found for: HGBA1C, MPG No results found for: PROLACTIN Lab Results  Component Value Date   CHOL 293 (HH) 01/02/2007   TRIG 249 (HH) 01/02/2007   HDL 42.5 01/02/2007   CHOLHDL 6.9 CALC 01/02/2007   VLDL 50  (H) 01/02/2007   Lab Results  Component Value Date   TSH 2.180 05/13/2014   TSH 1.49 08/08/2006    Therapeutic Level Labs: No results found for: LITHIUM No results found for: VALPROATE No components found for:  CBMZ  Current Medications: Current Outpatient Medications  Medication Sig Dispense  Refill   ARIPiprazole (ABILIFY) 10 MG tablet Take 1 tablet (10 mg total) by mouth daily. 30 tablet 3   Cholecalciferol (VITAMIN D-3) 5000 UNIT/ML LIQD Place under the tongue daily.     FLUoxetine (PROZAC) 40 MG capsule Take 1 capsule (40 mg total) by mouth daily. 30 capsule 3   No current facility-administered medications for this visit.     Musculoskeletal: Strength & Muscle Tone:  Unable to assess due to telehealth visit [place]:  Unable to assess due to telehealth visit Patient leans: N/A  Psychiatric Specialty Exam: Review of Systems  Last menstrual period 05/14/2014.There is no height or weight on file to calculate BMI.  General Appearance: Well Groomed  Eye Contact:  Good  Speech:  Clear and Coherent and Normal Rate  Volume:  Normal  Mood:  Euthymic  Affect:  Appropriate and Congruent  Thought Process:  Coherent, Goal Directed and Linear  Orientation:  Full (Time, Place, and Person)  Thought Content: WDL and Logical   Suicidal Thoughts:  No  Homicidal Thoughts:  No  Memory:  Immediate;   Good Recent;   Good Remote;   Good  Judgement:  Good  Insight:  Good  Psychomotor Activity:  Normal  Concentration:  Concentration: Good and Attention Span: Good  Recall:  Good  Fund of Knowledge: Good  Language: Good  Akathisia:   Unable to assess due to telehealth phone visit  Handed:  Right  AIMS (if indicated): Not done  Assets:  Communication Skills Desire for Improvement Financial Resources/Insurance Housing Social Support  ADL's:  Intact  Cognition: WNL  Sleep:  Good   Screenings: AUDIT    Flowsheet Row Admission (Discharged) from 05/12/2014 in [facility]  Alcohol Use Disorder Identification Test Final Score (AUDIT) 0      GAD-7    Flowsheet Row Video Visit from 08/01/2021 in [facility] Video Visit from 05/02/2021 in [facility] Video Visit from 02/22/2021 in [facility] Video Visit from 11/28/2020 in [facility] Video Visit from 10/12/2020 in [facility]  Total GAD-7 Score 0 0 7 4 0      PHQ2-9    Flowsheet Row Video Visit from 08/01/2021 in [facility] Video Visit from 05/02/2021 in [facility] Video Visit from 02/22/2021 in [facility] Video Visit from 11/28/2020 in [facility] Video Visit from 10/12/2020 in [facility]  PHQ-2 Total Score 0 1 6 6  0  PHQ-9 Total Score 1 3 20 19 2       Flowsheet Row Video Visit from 11/28/2020 in [facility] Video Visit from 10/12/2020 in [place] No Risk No Risk        Assessment and Plan: Patient notes that she is doing well on her current medication regimen.  No medication changes made today.  Patient agreeable to continue medications as prescribed.   1. Moderate episode of recurrent major depressive disorder (HCC) Continue- FLUoxetine (PROZAC) 40 MG capsule; Take 1 capsule (40 mg total) by mouth daily.  Dispense: 30 capsule; Refill: 3 Continue- ARIPiprazole (ABILIFY) 10 MG tablet; Take 1 tablet (10 mg total) by mouth daily.  Dispense: 30 tablet; Refill: 3   Follow up in 3 months Follow up with therapy  Salley Slaughter, NP 08/01/2021, 4:12  PM

## 2021-10-22 ENCOUNTER — Emergency Department (HOSPITAL_BASED_OUTPATIENT_CLINIC_OR_DEPARTMENT_OTHER)
Admission: EM | Admit: 2021-10-22 | Discharge: 2021-10-22 | Disposition: A | Discharge status: Home / Self Care | Payer: BC Managed Care – PPO | Attending: Emergency Medicine | Admitting: Emergency Medicine

## 2021-10-22 ENCOUNTER — Other Ambulatory Visit: Payer: Self-pay

## 2021-10-22 ENCOUNTER — Encounter (HOSPITAL_BASED_OUTPATIENT_CLINIC_OR_DEPARTMENT_OTHER): Payer: Self-pay | Admitting: Emergency Medicine

## 2021-10-22 ENCOUNTER — Emergency Department (HOSPITAL_BASED_OUTPATIENT_CLINIC_OR_DEPARTMENT_OTHER): Payer: BC Managed Care – PPO

## 2021-10-22 DIAGNOSIS — Z79899 Other long term (current) drug therapy: Secondary | ICD-10-CM | POA: Diagnosis not present

## 2021-10-22 DIAGNOSIS — X58XXXA Exposure to other specified factors, initial encounter: Secondary | ICD-10-CM | POA: Diagnosis not present

## 2021-10-22 DIAGNOSIS — S92255A Nondisplaced fracture of navicular [scaphoid] of left foot, initial encounter for closed fracture: Secondary | ICD-10-CM | POA: Insufficient documentation

## 2021-10-22 DIAGNOSIS — S92252A Displaced fracture of navicular [scaphoid] of left foot, initial encounter for closed fracture: Secondary | ICD-10-CM

## 2021-10-22 DIAGNOSIS — S99922A Unspecified injury of left foot, initial encounter: Secondary | ICD-10-CM | POA: Diagnosis present

## 2021-10-22 MED ORDER — OXYCODONE-ACETAMINOPHEN 5-325 MG PO TABS: 1.0000 | ORAL_TABLET | Freq: Four times a day (QID) | ORAL | 0 refills | Status: AC | PRN

## 2021-10-22 MED ORDER — OXYCODONE-ACETAMINOPHEN 5-325 MG PO TABS
1.0000 | ORAL_TABLET | Freq: Once | ORAL | Status: AC
  Administered 2021-10-22: 1 via ORAL
  Filled 2021-10-22: qty 1

## 2021-10-22 NOTE — ED Triage Notes (Addendum)
Pt states she tried to stand on her foot but it was asleep. She heard a "pop" and is unable to bear weight on her L foot. Moderate swelling to ankle/foot. Injury just PTA. + pedal pulses, able to wiggle toes.

## 2021-10-22 NOTE — ED Provider Notes (Signed)
[facility] Provider Note   CSN: [id number] Arrival date & time: 10/22/21  2056     History  Chief Complaint  Patient presents with   Foot Pain    Toni Alvarado is a 56 y.o. female who presents to the ED today with complaint of sudden onset, constant, sharp, left foot pain that began earlier tonight.  Patient states that she was sitting and felt like her foot was falling asleep.  She states she tried to get up and ambulate when she felt a crack in her right foot.  She states shortly after she had swelling to the dorsal aspect.  She states she had significant pain earlier.  She did apply ice with some improvement.  She came to the ED for further eval.  She denies any history of a easily fracturing bones.  Denies any history of cancer.  She does reports that she has had a bone density study in the past which has shown osteopenia.  She relates that she is supposed to have a repeat bone density scan done on 05/01.  The history is provided by the patient and medical records.      Home Medications Prior to Admission medications   Medication Sig Start Date End Date Taking? Authorizing Provider  oxyCODONE-acetaminophen (PERCOCET/ROXICET) 5-325 MG tablet Take 1 tablet by mouth every 6 (six) hours as needed for severe pain. 10/22/21  Yes Channa Hazelett, PA-C  ARIPiprazole (ABILIFY) 10 MG tablet Take 1 tablet (10 mg total) by mouth daily. 08/01/21   Salley Slaughter, NP  Cholecalciferol (VITAMIN D-3) 5000 UNIT/ML LIQD Place under the tongue daily.    [provider]  FLUoxetine (PROZAC) 40 MG capsule Take 1 capsule (40 mg total) by mouth daily. 08/01/21   Salley Slaughter, NP      Allergies    Patient has no known allergies.    Review of Systems   Review of Systems  Constitutional:  Negative for chills and fever.  Musculoskeletal:  Positive for arthralgias and joint swelling.  Skin:  Negative for wound.  All other systems reviewed and are  negative.  Physical Exam Updated Vital Signs BP 126/60 (BP Location: Right Arm)    Pulse 73    Temp 97.9 F (36.6 C) (Oral)    Resp 18    Ht 5\' 3"  (1.6 m)    Wt 93 kg    LMP 05/14/2014    SpO2 99%    BMI 36.31 kg/m  Physical Exam Vitals and nursing note reviewed.  Constitutional:      Appearance: She is not ill-appearing.  HENT:     Head: Normocephalic and atraumatic.  Eyes:     Conjunctiva/sclera: Conjunctivae normal.  Cardiovascular:     Rate and Rhythm: Normal rate and regular rhythm.  Pulmonary:     Effort: Pulmonary effort is normal.     Breath sounds: Normal breath sounds.  Musculoskeletal:     Comments: Contusion noted to dorsal aspect of L foot with associated TTP along 1st-3rd metatarsals proximally. ROM of ankle limited s/2 pain. 2+ DP pulse. Cap refill < 2 seconds to all digits. Able to wiggle toes without difficulty.   Skin:    General: Skin is warm and dry.     Coloration: Skin is not jaundiced.  Neurological:     Mental Status: She is alert.    ED Results / Procedures / Treatments   Labs (all labs ordered are listed, but only abnormal results are displayed) Labs  Reviewed - No data to display  EKG None  Radiology DG Ankle Complete Left  Result Date: 10/22/2021 CLINICAL DATA:  Left foot and ankle pain and swelling EXAM: LEFT FOOT - COMPLETE 3+ VIEW; LEFT ANKLE COMPLETE - 3+ VIEW COMPARISON:  None. FINDINGS: Cortical avulsion fracture along the dorsal navicular, age indeterminate, however appears acute. No additional fractures are identified involving the left foot or ankle. Ankle mortise is congruent. Joint spaces are preserved. Prominent bidirectional calcaneal enthesophytes. There is soft tissue swelling along the lateral aspect of the midfoot. IMPRESSION: 1. Cortical avulsion fracture along the dorsal navicular, age indeterminate, however appears acute. Correlate with point tenderness. 2. No additional fractures. Electronically Signed   By: Davina Poke D.O.    On: 10/22/2021 21:33   DG Foot Complete Left  Result Date: 10/22/2021 CLINICAL DATA:  Left foot and ankle pain and swelling EXAM: LEFT FOOT - COMPLETE 3+ VIEW; LEFT ANKLE COMPLETE - 3+ VIEW COMPARISON:  None. FINDINGS: Cortical avulsion fracture along the dorsal navicular, age indeterminate, however appears acute. No additional fractures are identified involving the left foot or ankle. Ankle mortise is congruent. Joint spaces are preserved. Prominent bidirectional calcaneal enthesophytes. There is soft tissue swelling along the lateral aspect of the midfoot. IMPRESSION: 1. Cortical avulsion fracture along the dorsal navicular, age indeterminate, however appears acute. Correlate with point tenderness. 2. No additional fractures. Electronically Signed   By: Davina Poke D.O.   On: 10/22/2021 21:33    Procedures Procedures    Medications Ordered in ED Medications  oxyCODONE-acetaminophen (PERCOCET/ROXICET) 5-325 MG per tablet 1 tablet (1 tablet Oral Given 10/22/21 2121)    ED Course/ Medical Decision Making/ A&P                           Medical Decision Making 38 female who presents to the ED today with complaint of left foot pain after hearing a crack when standing on foot.  On arrival to the ED vitals are stable.  Patient does appear slightly uncomfortable secondary to pain.  She is noted to have contusion to the dorsal aspect of her left foot along first through third metatarsals proximally with associated tenderness palpation.  She is neurovascularly intact throughout.  We will plan for x-ray of the foot and ankle for further evaluation. Provide pain medication as well.   Xray positive for avulsion fx. Cam walker provided at this time. Discussed case with attending physician Dr. Tamera Punt regarding mechanism/fracture. Recommends outpatient follow up with PCP for osteoporosis testing. Pt instructed on same. She is also provided ortho follow up. RICE therapy encouraged and pt discharged home  with short course of narcotic pain medication. She is in agreement with plan and stable for discharge home.   Problems Addressed: Closed avulsion fracture of navicular bone of left foot, initial encounter: acute illness or injury  Amount and/or Complexity of Data Reviewed Radiology: ordered and independent interpretation performed.    Details: Xray viewed by myself. Does have corticol changes to R ankle. Radiologist confirmed avulsion fracture along dorsal navicular.  Risk Prescription drug management.          Final Clinical Impression(s) / ED Diagnoses Final diagnoses:  Closed avulsion fracture of navicular bone of left foot, initial encounter    Rx / DC Orders ED Discharge Orders          Ordered    oxyCODONE-acetaminophen (PERCOCET/ROXICET) 5-325 MG tablet  Every 6 hours PRN  10/22/21 2150             Discharge Instructions      Please follow up with Dr. Tamera Punt orthopedics for further evaluation of your fracture.  Until you can be seen by him wear the boot and weight bare as tolerated.   While at home please rest, ice, and elevate your foot to help with pain/swelling.   I would recommend taking 800 mg Ibuprofen (4 OTC tablets) every 8 hours as needed for pain. Take the narcotic medication for breakthrough pain.   Please also follow up with your PCP regarding workup for osteoporosis given your fracture today without trauma.   Return to the ED for any new/worsening symptoms         Eustaquio Maize, Hershal Coria 10/22/21 2150    Malvin Johns, MD 10/22/21 2220

## 2021-10-22 NOTE — ED Notes (Signed)
Cold compress applied to L foot/ankle.

## 2021-10-22 NOTE — Discharge Instructions (Addendum)
Please follow up with Dr. Tamera Punt orthopedics for further evaluation of your fracture.  Until you can be seen by him wear the boot and weight bare as tolerated.   While at home please rest, ice, and elevate your foot to help with pain/swelling.   I would recommend taking 800 mg Ibuprofen (4 OTC tablets) every 8 hours as needed for pain. Take the narcotic medication for breakthrough pain.   Please also follow up with your PCP regarding workup for osteoporosis given your fracture today without trauma.   Return to the ED for any new/worsening symptoms

## 2021-10-22 NOTE — ED Notes (Signed)
Patient discharged to home.  All discharge instructions reviewed.  Patient verbalized understanding via teachback method.  VS WDL.  Respirations even and unlabored.  Wheelchair out of ED.

## 2021-10-24 ENCOUNTER — Telehealth (HOSPITAL_COMMUNITY): Payer: BC Managed Care – PPO | Admitting: Psychiatry

## 2021-10-24 ENCOUNTER — Encounter (HOSPITAL_COMMUNITY): Payer: Self-pay

## 2021-10-26 ENCOUNTER — Telehealth (INDEPENDENT_AMBULATORY_CARE_PROVIDER_SITE_OTHER): Payer: BC Managed Care – PPO | Admitting: Psychiatry

## 2021-10-26 DIAGNOSIS — F4310 Post-traumatic stress disorder, unspecified: Secondary | ICD-10-CM | POA: Diagnosis not present

## 2021-10-26 DIAGNOSIS — F411 Generalized anxiety disorder: Secondary | ICD-10-CM

## 2021-10-26 DIAGNOSIS — F331 Major depressive disorder, recurrent, moderate: Secondary | ICD-10-CM

## 2021-10-26 MED ORDER — FLUOXETINE HCL 40 MG PO CAPS: 40.0000 mg | ORAL_CAPSULE | Freq: Every day | ORAL | 3 refills | Status: DC

## 2021-10-26 MED ORDER — ARIPIPRAZOLE 10 MG PO TABS: 10.0000 mg | ORAL_TABLET | Freq: Every day | ORAL | 3 refills | Status: DC

## 2021-10-26 NOTE — Progress Notes (Signed)
[place] MD/PA/NP OP Progress Note  10/26/2021 5:17 PM Toni Alvarado  MRN:  [id number] Virtual Visit via Telephone Note  I connected with Toni Alvarado on 10/26/21 at  5:00 PM EST by telephone and verified that I am speaking with the correct person using two identifiers.  Location: Patient: home Provider: offsite   I discussed the limitations, risks, security and privacy concerns of performing an evaluation and management service by telephone and the availability of in person appointments. I also discussed with the patient that there may be a patient responsible charge related to this service. The patient expressed understanding and agreed to proceed.    I discussed the assessment and treatment plan with the patient. The patient was provided an opportunity to ask questions and all were answered. The patient agreed with the plan and demonstrated an understanding of the instructions.   The patient was advised to call back or seek an in-person evaluation if the symptoms worsen or if the condition fails to improve as anticipated.  I provided 15 minutes of non-face-to-face time during this encounter.   Franne Grip, NP   Chief Complaint: Medication management  HPI: Toni Alvarado is a 56 year old female presenting to [facility] behavioral health outpatient for a follow-up psychiatric evaluation.  She has a past psychiatric history of PTSD, generalized anxiety disorder, major depressive disorder.  Her symptoms are managed with fluoxetine 40 mg grams daily and Abilify 10 mg daily.  Patient reports recent noncompliance with Abilify but she has restarted taking the medication since her fianc informed her that her mood has become more irritable.  Patient agrees to adhere to medication regimen as prescribed for optimal symptom management.  She reports medication compliance with Prozac.  She reports recent stressors because she broke her foot and has been out of work.  Patient reports that she returns to work  on Monday.    Patient is alert and oriented x4, calm, pleasant and willing to engage.  She reports an okay mood but has been down at times due to life stressors.  She reports a good appetite and sleep.  Patient denies suicidal homicidal ideations, paranoia, delusional thought, or auditory or visual hallucinations.  Visit Diagnosis:    ICD-10-CM   1. PTSD (post-traumatic stress disorder)  F43.10     2. Moderate episode of recurrent major depressive disorder (HCC)  F33.1 ARIPiprazole (ABILIFY) 10 MG tablet    FLUoxetine (PROZAC) 40 MG capsule    3. Generalized anxiety disorder  F41.1       Past Psychiatric History: See below  Past Medical History:  Past Medical History:  Diagnosis Date   Anemia    ANEMIA, B12 DEFICIENCY 05/01/2007   Qualifier: Diagnosis of  By: Arnoldo Morale MD, John E    Anxiety    B12 deficiency    Depression    DEPRESSION 04/08/2007   Qualifier: Diagnosis of  By: Jimmye Norman, LPN, Winfield Cunas    DYSLIPIDEMIA 10/06/2007   Qualifier: Diagnosis of  By: Lurlean Nanny LPN, Regina     Generalized anxiety disorder 05/13/2014   Hx of adenomatous polyp of colon 04/06/2021   single adenoma < 1 cm   INTERNAL HEMORRHOIDS 10/06/2007   Qualifier: Diagnosis of  By: Lurlean Nanny LPN, Regina     Irritable bowel syndrome 10/06/2007   Qualifier: Diagnosis of  By: Lurlean Nanny LPN, Regina     MENORRHAGIA 10/06/2007   Qualifier: Diagnosis of  By: Lurlean Nanny LPN, Donzetta Starch, HX OF 04/08/2007   Qualifier: Diagnosis of  By: Jimmye Norman, LPN, Winfield Cunas    OBESITY 10/06/2007   Qualifier: Diagnosis of  By: Lurlean Nanny LPN, Wend[place]     OSTEOPENIA 04/08/2007   Qualifier: Diagnosis of  By: Jimmye Norman, LPN, Winfield Cunas    PTSD (post-traumatic stress disorder) 06/06/2018   RECTAL BLEEDING 10/06/2007   Qualifier: Diagnosis of  By: Lurlean Nanny LPN, Regina     Severe episode of recurrent major depressive disorder ([place]) 05/13/2014   Suicidal ideation 05/12/2014    Past Surgical History:  Procedure Laterality Date   COLONOSCOPY      LITHOTRIPSY      Family Psychiatric History: See below  Family History:  Family History  Problem Relation Age of Onset   Hyperlipidemia Mother    Mental illness Mother    Alcohol abuse Mother    Depression Mother    Physical abuse Mother    Diabetes Father    Heart disease Father    Hyperlipidemia Father    Stroke Father    Mental illness Father    Alcohol abuse Father    Bipolar disorder Father    Breast cancer Sister    Mental illness Brother    Cancer Brother    ADD / ADHD Brother    Alcohol abuse Brother    Bipolar disorder Brother    Sexual abuse Brother    Colon cancer Maternal Aunt    Alcohol abuse Maternal Aunt    Anxiety disorder Maternal Aunt    Depression Maternal Aunt    Drug abuse Maternal Aunt    Alcohol abuse Maternal Uncle    Alcohol abuse Maternal Grandfather    Alcohol abuse Paternal Grandfather    Depression Daughter        good with Cymbalta   Esophageal cancer Neg Hx    Rectal cancer Neg Hx    Stomach cancer Neg Hx     Social History:  Social History   Socioeconomic History   Marital status: Legally Separated    Spouse name: Not on file   Number of children: Not on file   Years of education: Not on file   Highest education level: Not on file  Occupational History   Not on file  Tobacco Use   Smoking status: Former    Packs/day: 0.50    Types: Cigarettes    Quit date: 03/27/2001    Years since quitting: 20.5   Smokeless tobacco: Never   Tobacco comments:    working to stop doing this again.  Was quit for 10 years  Vaping Use   Vaping Use: Never used  Substance and Sexual Activity   Alcohol use: Yes    Comment: Occasional use but did drink some the previous week to help wiht anxitety per patient  report    Drug use: No   Sexual activity: Yes    Partners: Male    Birth control/protection: None  Other Topics Concern   Not on file  Social History Narrative   Not on file   Social Determinants of Health   Financial Resource  Strain: Not on file  Food Insecurity: Not on file  Transportation Needs: Not on file  Physical Activity: Not on file  Stress: Not on file  Social Connections: Not on file    Allergies: No Known Allergies  Metabolic Disorder Labs: No results found for: HGBA1C, MPG No results found for: PROLACTIN Lab Results  Component Value Date   CHOL 293 (HH) 01/02/2007   TRIG 249 (HH) 01/02/2007   HDL 42.5 01/02/2007  CHOLHDL 6.9 CALC 01/02/2007   VLDL 50 (H) 01/02/2007   Lab Results  Component Value Date   TSH 2.180 05/13/2014   TSH 1.49 08/08/2006    Therapeutic Level Labs: No results found for: LITHIUM No results found for: VALPROATE No components found for:  CBMZ  Current Medications: Current Outpatient Medications  Medication Sig Dispense Refill   ARIPiprazole (ABILIFY) 10 MG tablet Take 1 tablet (10 mg total) by mouth daily. 30 tablet 3   Cholecalciferol (VITAMIN D-3) 5000 UNIT/ML LIQD Place under the tongue daily.     FLUoxetine (PROZAC) 40 MG capsule Take 1 capsule (40 mg total) by mouth daily. 30 capsule 3   oxyCODONE-acetaminophen (PERCOCET/ROXICET) 5-325 MG tablet Take 1 tablet by mouth every 6 (six) hours as needed for severe pain. 12 tablet 0   No current facility-administered medications for this visit.     Musculoskeletal: Strength & Muscle Tone: N/A Gait & Station: N/A Patient leans: N/A  Psychiatric Specialty Exam: Review of Systems  Psychiatric/Behavioral:  Positive for dysphoric mood. Negative for hallucinations, self-injury, sleep disturbance and suicidal ideas.   All other systems reviewed and are negative.  Last menstrual period 05/14/2014.There is no height or weight on file to calculate BMI.  General Appearance: N/A  Eye Contact: N/A  Speech: Clear and coherent  Volume: Normal  Mood: Depressed  Affect: N/A  Thought Process: Goal directed  Orientation:  Full (Time, Place, and Person)  Thought Content: Logical   Suicidal Thoughts:  No  Homicidal  Thoughts:  No  Memory: Good  Judgement: Good  Insight: Good  Psychomotor Activity: Good   Concentration: Good   Recall: Good  Fund of Knowledge: Good  Language: Good  Akathisia: N/A  Handed: Right  AIMS (if indicated): Not done  Assets:  Communication Skills Desire for Improvement  ADL's:  Intact  Cognition: WNL  Sleep:  Good   Screenings: AUDIT    Flowsheet Row Admission (Discharged) from 05/12/2014 in [place] [facility]  Alcohol Use Disorder Identification Test Final Score (AUDIT) 0      GAD-7    Flowsheet Row Video Visit from 08/01/2021 in [facility] Video Visit from 05/02/2021 in [facility] Video Visit from 02/22/2021 in [facility] Video Visit from 11/28/2020 in [facility] Video Visit from 10/12/2020 in [facility]  Total GAD-7 Score 0 0 7 4 0      PHQ2-9    Flowsheet Row Video Visit from 08/01/2021 in [facility] Video Visit from 05/02/2021 in [facility] Video Visit from 02/22/2021 in [facility] Video Visit from 11/28/2020 in [facility] Video Visit from 10/12/2020 in [facility]  PHQ-2 Total Score 0 1 6 6  0  PHQ-9 Total Score 1 3 20 19 2       Flowsheet Row ED from 10/22/2021 in [place] Video Visit from 11/28/2020 in [facility] Video Visit from 10/12/2020 in [place] No Risk No Risk No Risk        Assessment and Plan: Toni Alvarado is a 56 year old female presenting to [facility] behavioral health outpatient for a follow-up psychiatric evaluation.  She has a past psychiatric history of PTSD, generalized anxiety disorder, major depressive  disorder.  Her symptoms are managed with fluoxetine 40 mg  grams daily and Abilify 10 mg daily.  Patient reports recent noncompliance with Abilify but she has restarted taking the medication since her fianc informed her that her mood has become more irritable.  Patient agrees to adhere to medication regimen as prescribed for optimal symptom management.  She reports medication compliance with Prozac.  She denies a need for dose adjustments today.  Patient to continue current regimen and medication refill.  Collaboration of Care: Collaboration of Care: Medication Management AEB medications E scribed to patient's preferred pharmacy  1. Moderate episode of recurrent major depressive disorder (HCC)  - ARIPiprazole (ABILIFY) 10 MG tablet; Take 1 tablet (10 mg total) by mouth daily.  Dispense: 30 tablet; Refill: 3 - FLUoxetine (PROZAC) 40 MG capsule; Take 1 capsule (40 mg total) by mouth daily.  Dispense: 30 capsule; Refill: 3  2. PTSD (post-traumatic stress disorder) - FLUoxetine (PROZAC) 40 MG capsule; Take 1 capsule (40 mg total) by mouth daily.  Dispense: 30 capsule; Refill: 3  3. Generalized anxiety disorder - FLUoxetine (PROZAC) 40 MG capsule; Take 1 capsule (40 mg total) by mouth daily.  Dispense: 30 capsule; Refill: 3   Follow-up in 3 months  Patient/Guardian was advised Release of Information must be obtained prior to any record release in order to collaborate their care with an outside provider. Patient/Guardian was advised if they have not already done so to contact the registration department to sign all necessary forms in order for [place] to release information regarding their care.   Consent: Patient/Guardian gives verbal consent for treatment and assignment of benefits for services provided during this visit. Patient/Guardian expressed understanding and agreed to proceed.    Franne Grip, NP 10/26/2021, 5:17 PM

## 2021-12-27 ENCOUNTER — Other Ambulatory Visit: Payer: Self-pay | Admitting: Obstetrics and Gynecology

## 2021-12-27 DIAGNOSIS — R928 Other abnormal and inconclusive findings on diagnostic imaging of breast: Secondary | ICD-10-CM

## 2022-01-18 ENCOUNTER — Ambulatory Visit: Payer: BC Managed Care – PPO

## 2022-01-18 ENCOUNTER — Ambulatory Visit
Admission: RE | Admit: 2022-01-18 | Discharge: 2022-01-18 | Disposition: A | Discharge status: Home / Self Care | Payer: BC Managed Care – PPO | Source: Ambulatory Visit | Attending: Obstetrics and Gynecology | Admitting: Obstetrics and Gynecology

## 2022-01-18 DIAGNOSIS — R928 Other abnormal and inconclusive findings on diagnostic imaging of breast: Secondary | ICD-10-CM

## 2022-01-24 ENCOUNTER — Telehealth (INDEPENDENT_AMBULATORY_CARE_PROVIDER_SITE_OTHER): Payer: BC Managed Care – PPO | Admitting: Psychiatry

## 2022-01-24 ENCOUNTER — Encounter (HOSPITAL_COMMUNITY): Payer: Self-pay | Admitting: Psychiatry

## 2022-01-24 DIAGNOSIS — F331 Major depressive disorder, recurrent, moderate: Secondary | ICD-10-CM

## 2022-01-24 MED ORDER — FLUOXETINE HCL 40 MG PO CAPS: 40.0000 mg | ORAL_CAPSULE | Freq: Every day | ORAL | 3 refills | Status: DC

## 2022-01-24 MED ORDER — ARIPIPRAZOLE 10 MG PO TABS: 10.0000 mg | ORAL_TABLET | Freq: Every day | ORAL | 3 refills | Status: DC

## 2022-01-24 NOTE — Progress Notes (Signed)
[place] MD/PA/NP OP Progress Note Virtual Visit via Video Note  I connected with Toni Alvarado on 01/24/22 at  4:00 PM EDT by a video enabled telemedicine application and verified that I am speaking with the correct person using two identifiers.  Location: Patient: Home Provider: Clinic   I discussed the limitations of evaluation and management by telemedicine and the availability of in person appointments. The patient expressed understanding and agreed to proceed.  I provided 30 minutes of non-face-to-face time during this encounter.         01/24/2022 4:20 PM Toni Alvarado  MRN:  [id number]  Chief Complaint:  "I'm doing pretty good"  HPI: 56 year old female seen today for follow up psychiatric evaluation.  She has a psychiatric history of anxiety, PTSD, and depression.  She is currently managed on Prozac 40 mg daily and Abilify 10 mg daily.  She notes that her medications are effective in managing her psychiatric conditions.   Today she was well groomed, pleasant, cooperative, and engaged in conversation. She informed Probation officer that she has been doing pretty good.  She notes that at times she worries about her fianc who has cancer but notes that she is able to cope with it.  Overall she reports her anxiety and depression are well managed.  Provider conducted a GAD-7 and patient scored a 0.  Provider also conducted PHQ-9 and patient scored a 2.  She endorses adequate appetite and sleep.  Today she denies SI/HI/VAH, mania, or paranoia.    No medication changes made today.  Patient agreeable to continue medications as prescribed.  She will follow up with outpatient counseling for therapy.  No other concerns noted at this time.  Visit Diagnosis:    ICD-10-CM   1. Moderate episode of recurrent major depressive disorder (HCC)  F33.1 ARIPiprazole (ABILIFY) 10 MG tablet    FLUoxetine (PROZAC) 40 MG capsule      Past Psychiatric History: anxiety, PTSD, and depression.   Past Medical History:   Past Medical History:  Diagnosis Date   Anemia    ANEMIA, B12 DEFICIENCY 05/01/2007   Qualifier: Diagnosis of  By: Arnoldo Morale MD, John E    Anxiety    B12 deficiency    Depression    DEPRESSION 04/08/2007   Qualifier: Diagnosis of  By: Jimmye Norman, LPN, Bonnye M    DYSLIPIDEMIA 10/06/2007   Qualifier: Diagnosis of  By: Lurlean Nanny LPN, Regina     Generalized anxiety disorder 05/13/2014   Hx of adenomatous polyp of colon 04/06/2021   single adenoma < 1 cm   INTERNAL HEMORRHOIDS 10/06/2007   Qualifier: Diagnosis of  By: Lurlean Nanny LPN, Regina     Irritable bowel syndrome 10/06/2007   Qualifier: Diagnosis of  By: Lurlean Nanny LPN, Regina     MENORRHAGIA 10/06/2007   Qualifier: Diagnosis of  By: Lurlean Nanny LPN, Donzetta Starch, HX OF 04/08/2007   Qualifier: Diagnosis of  By: Jimmye Norman LPN, Winfield Cunas    OBESITY 10/06/2007   Qualifier: Diagnosis of  By: Lurlean Nanny LPN, Regina     OSTEOPENIA 04/08/2007   Qualifier: Diagnosis of  By: Jimmye Norman, LPN, Winfield Cunas    PTSD (post-traumatic stress disorder) 06/06/2018   RECTAL BLEEDING 10/06/2007   Qualifier: Diagnosis of  By: Lurlean Nanny LPN, Regina     Severe episode of recurrent major depressive disorder ([place]) 05/13/2014   Suicidal ideation 05/12/2014    Past Surgical History:  Procedure Laterality Date   COLONOSCOPY     LITHOTRIPSY      Family  Psychiatric History: mother depression and alcohol use, father alcohol use and bipolar disorder   Family History:       Family History     Family History:  Family History  Problem Relation Age of Onset   Hyperlipidemia Mother    Mental illness Mother    Alcohol abuse Mother    Depression Mother    Physical abuse Mother    Diabetes Father    Heart disease Father    Hyperlipidemia Father    Stroke Father    Mental illness Father    Alcohol abuse Father    Bipolar disorder Father    Breast cancer Sister    Mental illness Brother    Cancer Brother    ADD / ADHD Brother    Alcohol abuse Brother    Bipolar  disorder Brother    Sexual abuse Brother    Colon cancer Maternal Aunt    Alcohol abuse Maternal Aunt    Anxiety disorder Maternal Aunt    Depression Maternal Aunt    Drug abuse Maternal Aunt    Alcohol abuse Maternal Uncle    Alcohol abuse Maternal Grandfather    Alcohol abuse Paternal Grandfather    Depression Daughter        good with Cymbalta   Esophageal cancer Neg Hx    Rectal cancer Neg Hx    Stomach cancer Neg Hx     Social History:  Social History   Socioeconomic History   Marital status: Legally Separated    Spouse name: Not on file   Number of children: Not on file   Years of education: Not on file   Highest education level: Not on file  Occupational History   Not on file  Tobacco Use   Smoking status: Former    Packs/day: 0.50    Types: Cigarettes    Quit date: 03/27/2001    Years since quitting: 20.8   Smokeless tobacco: Never   Tobacco comments:    working to stop doing this again.  Was quit for 10 years  Vaping Use   Vaping Use: Never used  Substance and Sexual Activity   Alcohol use: Yes    Comment: Occasional use but did drink some the previous week to help wiht anxitety per patient  report    Drug use: No   Sexual activity: Yes    Partners: Male    Birth control/protection: None  Other Topics Concern   Not on file  Social History Narrative   Not on file   Social Determinants of Health   Financial Resource Strain: Not on file  Food Insecurity: Not on file  Transportation Needs: Not on file  Physical Activity: Not on file  Stress: Not on file  Social Connections: Not on file    Allergies: No Known Allergies  Metabolic Disorder Labs: No results found for: HGBA1C, MPG No results found for: PROLACTIN Lab Results  Component Value Date   CHOL 293 (HH) 01/02/2007   TRIG 249 (HH) 01/02/2007   HDL 42.5 01/02/2007   CHOLHDL 6.9 CALC 01/02/2007   VLDL 50 (H) 01/02/2007   Lab Results  Component Value Date   TSH 2.180 05/13/2014   TSH  1.49 08/08/2006    Therapeutic Level Labs: No results found for: LITHIUM No results found for: VALPROATE No components found for:  CBMZ  Current Medications: Current Outpatient Medications  Medication Sig Dispense Refill   ARIPiprazole (ABILIFY) 10 MG tablet Take 1 tablet (10 mg total) by mouth  daily. 30 tablet 3   Cholecalciferol (VITAMIN D-3) 5000 UNIT/ML LIQD Place under the tongue daily.     FLUoxetine (PROZAC) 40 MG capsule Take 1 capsule (40 mg total) by mouth daily. 30 capsule 3   oxyCODONE-acetaminophen (PERCOCET/ROXICET) 5-325 MG tablet Take 1 tablet by mouth every 6 (six) hours as needed for severe pain. 12 tablet 0   No current facility-administered medications for this visit.     Musculoskeletal: Strength & Muscle Tone:   telehealth visit Gait & Station: normal,  telehealth visit Patient leans: N/A  Psychiatric Specialty Exam: Review of Systems  Last menstrual period 05/14/2014.There is no height or weight on file to calculate BMI.  General Appearance: Well Groomed  Eye Contact:  Good  Speech:  Clear and Coherent and Normal Rate  Volume:  Normal  Mood:  Euthymic  Affect:  Appropriate and Congruent  Thought Process:  Coherent, Goal Directed and Linear  Orientation:  Full (Time, Place, and Person)  Thought Content: WDL and Logical   Suicidal Thoughts:  No  Homicidal Thoughts:  No  Memory:  Immediate;   Good Recent;   Good Remote;   Good  Judgement:  Good  Insight:  Good  Psychomotor Activity:  Normal  Concentration:  Concentration: Good and Attention Span: Good  Recall:  Good  Fund of Knowledge: Good  Language: Good  Akathisia:   Unable to assess due to telehealth phone visit  Handed:  Right  AIMS (if indicated): Not done  Assets:  Communication Skills Desire for Improvement Financial Resources/Insurance Housing Social Support  ADL's:  Intact  Cognition: WNL  Sleep:  Good   Screenings: AUDIT    Flowsheet Row Admission (Discharged) from  05/12/2014 in [place] [facility]  Alcohol Use Disorder Identification Test Final Score (AUDIT) 0      GAD-7    Flowsheet Row Video Visit from 01/24/2022 in [place] Video Visit from 08/01/2021 in [facility] Video Visit from 05/02/2021 in [facility] Video Visit from 02/22/2021 in [facility] Video Visit from 11/28/2020 in [facility]  Total GAD-7 Score 0 0 0 7 4      PHQ2-9    Flowsheet Row Video Visit from 01/24/2022 in [facility] Video Visit from 08/01/2021 in [facility] Video Visit from 05/02/2021 in [facility] Video Visit from 02/22/2021 in [facility] Video Visit from 11/28/2020 in [facility]  PHQ-2 Total Score 0 0 '1 6 6  '$ PHQ-9 Total Score '2 1 3 20 19      '$ Flowsheet Row ED from 10/22/2021 in [place] Video Visit from 11/28/2020 in [facility] Video Visit from 10/12/2020 in [place] No Risk No Risk No Risk        Assessment and Plan: Patient notes that she is doing well on her current medication regimen.  No medication changes made today.  Patient agreeable to continue medications as prescribed.   1. Moderate episode of recurrent major depressive disorder (HCC) Continue- FLUoxetine (PROZAC) 40 MG capsule; Take 1 capsule (40 mg total) by mouth daily.  Dispense: 30 capsule; Refill: 3 Continue- ARIPiprazole (ABILIFY) 10 MG tablet; Take 1 tablet (10 mg total) by mouth daily.  Dispense: 30 tablet; Refill: 3   Follow up in 3 months  Follow up with therapy  Salley Slaughter, NP 01/24/2022, 4:20 PM

## 2022-04-26 ENCOUNTER — Telehealth (INDEPENDENT_AMBULATORY_CARE_PROVIDER_SITE_OTHER): Payer: BC Managed Care – PPO | Admitting: Psychiatry

## 2022-04-26 ENCOUNTER — Encounter (HOSPITAL_COMMUNITY): Payer: Self-pay | Admitting: Psychiatry

## 2022-04-26 DIAGNOSIS — F331 Major depressive disorder, recurrent, moderate: Secondary | ICD-10-CM

## 2022-04-26 MED ORDER — FLUOXETINE HCL 40 MG PO CAPS: 40.0000 mg | ORAL_CAPSULE | Freq: Every day | ORAL | 3 refills | Status: DC

## 2022-04-26 MED ORDER — ARIPIPRAZOLE 10 MG PO TABS: 10.0000 mg | ORAL_TABLET | Freq: Every day | ORAL | 3 refills | Status: DC

## 2022-04-26 NOTE — Progress Notes (Signed)
[facility] MD/PA/NP OP Progress Note Virtual Visit via Telephone Note  I connected with Toni Alvarado on 04/26/22 at  3:00 PM EDT by telephone and verified that I am speaking with the correct person using two identifiers.  Location: Patient: home Provider: Clinic   I discussed the limitations, risks, security and privacy concerns of performing an evaluation and management service by telephone and the availability of in person appointments. I also discussed with the patient that there may be a patient responsible charge related to this service. The patient expressed understanding and agreed to proceed.   I provided 30 minutes of non-face-to-face time during this encounter.          04/26/2022 3:52 PM Toni Alvarado  MRN:  [id number]  Chief Complaint:  "I'm good"  HPI: 56 year old female seen today for follow up psychiatric evaluation.  She has a psychiatric history of anxiety, PTSD, and depression.  She is currently managed on Prozac 40 mg daily and Abilify 10 mg daily.  She notes that her medications are effective in managing her psychiatric conditions.   Today she was unable to login virtually sources was done over the phone.  During exam she was pleasant, cooperative, and engaged in conversation.  She informed Probation officer that she was doing well.  She notes that she has minimal anxiety and depression and notes that she finds her medications are effective.  Patient informed Probation officer that she was at a doctors appointment with a family member and have limited time.  Provider was able to conduct a GAD-7 and patient scored a 0.  A PHQ-9 was also conducted and patient scored a 3.  She endorsed adequate sleep and appetite.  Today she denies SI/HI/VAH, mania, paranoia.    No medication changes made today.  Patient agreeable to continue medications as prescribed.  She will follow up with outpatient counseling for therapy.  No other concerns noted at this time.  Visit Diagnosis:    ICD-10-CM   1. Moderate  episode of recurrent major depressive disorder (HCC)  F33.1 ARIPiprazole (ABILIFY) 10 MG tablet    FLUoxetine (PROZAC) 40 MG capsule      Past Psychiatric History: anxiety, PTSD, and depression.   Past Medical History:  Past Medical History:  Diagnosis Date   Anemia    ANEMIA, B12 DEFICIENCY 05/01/2007   Qualifier: Diagnosis of  By: Arnoldo Morale MD, John E    Anxiety    B12 deficiency    Depression    DEPRESSION 04/08/2007   Qualifier: Diagnosis of  By: Jimmye Norman, LPN, Bonnye M    DYSLIPIDEMIA 10/06/2007   Qualifier: Diagnosis of  By: Lurlean Nanny LPN, Regina     Generalized anxiety disorder 05/13/2014   Hx of adenomatous polyp of colon 04/06/2021   single adenoma < 1 cm   INTERNAL HEMORRHOIDS 10/06/2007   Qualifier: Diagnosis of  By: Lurlean Nanny LPN, Regina     Irritable bowel syndrome 10/06/2007   Qualifier: Diagnosis of  By: Lurlean Nanny LPN, Regina     MENORRHAGIA 10/06/2007   Qualifier: Diagnosis of  By: Lurlean Nanny LPN, Donzetta Starch, HX OF 04/08/2007   Qualifier: Diagnosis of  By: Jimmye Norman LPN, Winfield Cunas    OBESITY 10/06/2007   Qualifier: Diagnosis of  By: Lurlean Nanny LPN, Regina     OSTEOPENIA 04/08/2007   Qualifier: Diagnosis of  By: Jimmye Norman LPN, Winfield Cunas    PTSD (post-traumatic stress disorder) 06/06/2018   RECTAL BLEEDING 10/06/2007   Qualifier: Diagnosis of  By: Lurlean Nanny LPN, Rollene Fare  Severe episode of recurrent major depressive disorder ([place]) 05/13/2014   Suicidal ideation 05/12/2014    Past Surgical History:  Procedure Laterality Date   COLONOSCOPY     LITHOTRIPSY      Family Psychiatric History: mother depression and alcohol use, father alcohol use and bipolar disorder   Family History:       Family History     Family History:  Family History  Problem Relation Age of Onset   Hyperlipidemia Mother    Mental illness Mother    Alcohol abuse Mother    Depression Mother    Physical abuse Mother    Diabetes Father    Heart disease Father    Hyperlipidemia Father    Stroke  Father    Mental illness Father    Alcohol abuse Father    Bipolar disorder Father    Breast cancer Sister    Mental illness Brother    Cancer Brother    ADD / ADHD Brother    Alcohol abuse Brother    Bipolar disorder Brother    Sexual abuse Brother    Colon cancer Maternal Aunt    Alcohol abuse Maternal Aunt    Anxiety disorder Maternal Aunt    Depression Maternal Aunt    Drug abuse Maternal Aunt    Alcohol abuse Maternal Uncle    Alcohol abuse Maternal Grandfather    Alcohol abuse Paternal Grandfather    Depression Daughter        good with Cymbalta   Esophageal cancer Neg Hx    Rectal cancer Neg Hx    Stomach cancer Neg Hx     Social History:  Social History   Socioeconomic History   Marital status: Legally Separated    Spouse name: Not on file   Number of children: Not on file   Years of education: Not on file   Highest education level: Not on file  Occupational History   Not on file  Tobacco Use   Smoking status: Former    Packs/day: 0.50    Types: Cigarettes    Quit date: 03/27/2001    Years since quitting: 21.0   Smokeless tobacco: Never   Tobacco comments:    working to stop doing this again.  Was quit for 10 years  Vaping Use   Vaping Use: Never used  Substance and Sexual Activity   Alcohol use: Yes    Comment: Occasional use but did drink some the previous week to help wiht anxitety per patient  report    Drug use: No   Sexual activity: Yes    Partners: Male    Birth control/protection: None  Other Topics Concern   Not on file  Social History Narrative   Not on file   Social Determinants of Health   Financial Resource Strain: Low Risk  (10/24/2017)   Overall Financial Resource Strain (CARDIA)    Difficulty of Paying Living Expenses: Not very hard  Food Insecurity: No Food Insecurity (10/24/2017)   Hunger Vital Sign    Worried About Running Out of Food in the Last Year: Never true    Ran Out of Food in the Last Year: Never true  Transportation  Needs: No Transportation Needs (10/24/2017)   PRAPARE - Hydrologist (Medical): No    Lack of Transportation (Non-Medical): No  Physical Activity: Unknown (10/24/2017)   Exercise Vital Sign    Days of Exercise per Week: 0 days    Minutes of Exercise per  Session: Not on file  Stress: Stress Concern Present (10/24/2017)   [place]    Feeling of Stress : Very much  Social Connections: Somewhat Isolated (10/24/2017)   Social Connection and Isolation Panel [NHANES]    Frequency of Communication with Friends and Family: More than three times a week    Frequency of Social Gatherings with Friends and Family: Once a week    Attends Religious Services: Never    Marine scientist or Organizations: Yes    Attends Music therapist: More than 4 times per year    Marital Status: Separated    Allergies: No Known Allergies  Metabolic Disorder Labs: No results found for: "HGBA1C", "MPG" No results found for: "PROLACTIN" Lab Results  Component Value Date   CHOL 293 (HH) 01/02/2007   TRIG 249 (HH) 01/02/2007   HDL 42.5 01/02/2007   CHOLHDL 6.9 CALC 01/02/2007   VLDL 50 (H) 01/02/2007   Lab Results  Component Value Date   TSH 2.180 05/13/2014   TSH 1.49 08/08/2006    Therapeutic Level Labs: No results found for: "LITHIUM" No results found for: "VALPROATE" Lab Results  Component Value Date   CBMZ 10.7 Performed at [facility] 02/02/2007    Current Medications: Current Outpatient Medications  Medication Sig Dispense Refill   ARIPiprazole (ABILIFY) 10 MG tablet Take 1 tablet (10 mg total) by mouth daily. 30 tablet 3   Cholecalciferol (VITAMIN D-3) 5000 UNIT/ML LIQD Place under the tongue daily.     FLUoxetine (PROZAC) 40 MG capsule Take 1 capsule (40 mg total) by mouth daily. 30 capsule 3   oxyCODONE-acetaminophen (PERCOCET/ROXICET) 5-325 MG tablet Take 1 tablet by mouth  every 6 (six) hours as needed for severe pain. 12 tablet 0   No current facility-administered medications for this visit.     Musculoskeletal: Strength & Muscle Tone:  Unable to assess due to telephone visit Gait & Station:  Unable to assess due to telephone visit Patient leans: N/A  Psychiatric Specialty Exam: Review of Systems  Last menstrual period 05/14/2014.There is no height or weight on file to calculate BMI.  General Appearance:  Unable to assess due to telephone visit  Eye Contact:   Unable to assess due to telephone visit  Speech:  Clear and Coherent and Normal Rate  Volume:  Normal  Mood:  Euthymic  Affect:  Appropriate and Congruent  Thought Process:  Coherent, Goal Directed and Linear  Orientation:  Full (Time, Place, and Person)  Thought Content: WDL and Logical   Suicidal Thoughts:  No  Homicidal Thoughts:  No  Memory:  Immediate;   Good Recent;   Good Remote;   Good  Judgement:  Good  Insight:  Good  Psychomotor Activity:   Unable to assess due to telephone visit  Concentration:  Concentration: Good and Attention Span: Good  Recall:  Good  Fund of Knowledge: Good  Language: Good  Akathisia:   Unable to assess due to telephone visit  Handed:  Right  AIMS (if indicated): Not done  Assets:  Communication Skills Desire for Improvement Financial Resources/Insurance Housing Social Support  ADL's:  Intact  Cognition: WNL  Sleep:  Good   Screenings: AUDIT    Flowsheet Row Admission (Discharged) from 05/12/2014 in [place] [facility]  Alcohol Use Disorder Identification Test Final Score (AUDIT) 0      GAD-7    Flowsheet Row Video Visit from 04/26/2022 in [facility]  [place] Video Visit from 01/24/2022 in [facility] Video Visit from 08/01/2021 in [facility] Video Visit from 05/02/2021 in [facility] Video Visit from 02/22/2021  in [facility]  Total GAD-7 Score 0 0 0 0 7      PHQ2-9    Flowsheet Row Video Visit from 04/26/2022 in [facility] Video Visit from 01/24/2022 in [facility] Video Visit from 08/01/2021 in [facility] Video Visit from 05/02/2021 in [facility] Video Visit from 02/22/2021 in [facility]  PHQ-2 Total Score 2 0 0 1 6  PHQ-9 Total Score '3 2 1 3 20      '$ Flowsheet Row ED from 10/22/2021 in [place] Video Visit from 11/28/2020 in [facility] Video Visit from 10/12/2020 in [place] No Risk No Risk No Risk        Assessment and Plan: Patient notes that she is doing well on her current medication regimen.  No medication changes made today.  Patient agreeable to continue medications as prescribed.   1. Moderate episode of recurrent major depressive disorder (HCC) Continue- FLUoxetine (PROZAC) 40 MG capsule; Take 1 capsule (40 mg total) by mouth daily.  Dispense: 30 capsule; Refill: 3 Continue- ARIPiprazole (ABILIFY) 10 MG tablet; Take 1 tablet (10 mg total) by mouth daily.  Dispense: 30 tablet; Refill: 3   Follow up in 3 months Follow up with therapy  Salley Slaughter, NP 04/26/2022, 3:52 PM

## 2022-07-24 ENCOUNTER — Telehealth (INDEPENDENT_AMBULATORY_CARE_PROVIDER_SITE_OTHER): Payer: BC Managed Care – PPO | Admitting: Psychiatry

## 2022-07-24 ENCOUNTER — Encounter (HOSPITAL_COMMUNITY): Payer: Self-pay | Admitting: Psychiatry

## 2022-07-24 DIAGNOSIS — F331 Major depressive disorder, recurrent, moderate: Secondary | ICD-10-CM

## 2022-07-24 MED ORDER — ARIPIPRAZOLE 10 MG PO TABS: 10.0000 mg | ORAL_TABLET | Freq: Every day | ORAL | 3 refills | Status: DC

## 2022-07-24 MED ORDER — FLUOXETINE HCL 40 MG PO CAPS: 40.0000 mg | ORAL_CAPSULE | Freq: Every day | ORAL | 3 refills | Status: DC

## 2022-07-24 NOTE — Progress Notes (Signed)
[facility] MD/PA/NP OP Progress Note Virtual Visit via Telephone Note  I connected with Toni Alvarado on 07/24/22 at  3:30 PM EST by telephone and verified that I am speaking with the correct person using two identifiers.  Location: Patient: Work Provider: Clinic   I discussed the limitations, risks, security and privacy concerns of performing an evaluation and management service by telephone and the availability of in person appointments. I also discussed with the patient that there may be a patient responsible charge related to this service. The patient expressed understanding and agreed to proceed.   I provided 30 minutes of non-face-to-face time during this encounter.          07/24/2022 10:36 AM Toni Alvarado  MRN:  [id number]  Chief Complaint:  "With everything that is going on I'm doing well"  HPI: 56 year old female seen today for follow up psychiatric evaluation.  She has a psychiatric history of anxiety, PTSD, and depression.  She is currently managed on Prozac 40 mg daily and Abilify 10 mg daily.  She notes that her medications are effective in managing her psychiatric conditions.   Today she was unable to login virtually sources was done over the phone.  During exam she was pleasant, cooperative, and engaged in conversation.  She informed Probation officer that she is going well with everything that is going on. She notes that her significant other continues to battle cancer. She notes that she and him will be visiting the oncologist to see if his cancer spread and is worried about this. Despite this stressor she reports that she has been able to maintain 2  jobs and has minimal anxiety and depression. Today provider conducted a GAD-7 and patient scored a 3, at her last visit she scored a 0.  Provider also conducted a PHQ-9 and patient scored a 0, at her last visit she scored a 3.  She endorsed adequate sleep and appetite.  Today she denies SI/HI/VAH, mania, paranoia.    No medication changes  made today.  Patient agreeable to continue medications as prescribed.  She will follow up with outpatient counseling for therapy.  No other concerns noted at this time.  Visit Diagnosis:    ICD-10-CM   1. Moderate episode of recurrent major depressive disorder (HCC)  F33.1 ARIPiprazole (ABILIFY) 10 MG tablet    FLUoxetine (PROZAC) 40 MG capsule      Past Psychiatric History: anxiety, PTSD, and depression.   Past Medical History:  Past Medical History:  Diagnosis Date   Anemia    ANEMIA, B12 DEFICIENCY 05/01/2007   Qualifier: Diagnosis of  By: Arnoldo Morale MD, John E    Anxiety    B12 deficiency    Depression    DEPRESSION 04/08/2007   Qualifier: Diagnosis of  By: Jimmye Norman, LPN, Bonnye M    DYSLIPIDEMIA 10/06/2007   Qualifier: Diagnosis of  By: Lurlean Nanny LPN, Regina     Generalized anxiety disorder 05/13/2014   Hx of adenomatous polyp of colon 04/06/2021   single adenoma < 1 cm   INTERNAL HEMORRHOIDS 10/06/2007   Qualifier: Diagnosis of  By: Lurlean Nanny LPN, Regina     Irritable bowel syndrome 10/06/2007   Qualifier: Diagnosis of  By: Lurlean Nanny LPN, Regina     MENORRHAGIA 10/06/2007   Qualifier: Diagnosis of  By: Lurlean Nanny LPN, Donzetta Starch, HX OF 04/08/2007   Qualifier: Diagnosis of  By: Jimmye Norman LPN, Winfield Cunas    OBESITY 10/06/2007   Qualifier: Diagnosis of  By: Lurlean Nanny LPN, Rollene Fare  OSTEOPENIA 04/08/2007   Qualifier: Diagnosis of  By: Jimmye Norman, LPN, Winfield Cunas    PTSD (post-traumatic stress disorder) 06/06/2018   RECTAL BLEEDING 10/06/2007   Qualifier: Diagnosis of  By: Lurlean Nanny LPN, Regina     Severe episode of recurrent major depressive disorder ([place]) 05/13/2014   Suicidal ideation 05/12/2014    Past Surgical History:  Procedure Laterality Date   COLONOSCOPY     LITHOTRIPSY      Family Psychiatric History: mother depression and alcohol use, father alcohol use and bipolar disorder   Family History:       Family History     Family History:  Family History  Problem Relation  Age of Onset   Hyperlipidemia Mother    Mental illness Mother    Alcohol abuse Mother    Depression Mother    Physical abuse Mother    Diabetes Father    Heart disease Father    Hyperlipidemia Father    Stroke Father    Mental illness Father    Alcohol abuse Father    Bipolar disorder Father    Breast cancer Sister    Mental illness Brother    Cancer Brother    ADD / ADHD Brother    Alcohol abuse Brother    Bipolar disorder Brother    Sexual abuse Brother    Colon cancer Maternal Aunt    Alcohol abuse Maternal Aunt    Anxiety disorder Maternal Aunt    Depression Maternal Aunt    Drug abuse Maternal Aunt    Alcohol abuse Maternal Uncle    Alcohol abuse Maternal Grandfather    Alcohol abuse Paternal Grandfather    Depression Daughter        good with Cymbalta   Esophageal cancer Neg Hx    Rectal cancer Neg Hx    Stomach cancer Neg Hx     Social History:  Social History   Socioeconomic History   Marital status: Legally Separated    Spouse name: Not on file   Number of children: Not on file   Years of education: Not on file   Highest education level: Not on file  Occupational History   Not on file  Tobacco Use   Smoking status: Former    Packs/day: 0.50    Types: Cigarettes    Quit date: 03/27/2001    Years since quitting: 21.3   Smokeless tobacco: Never   Tobacco comments:    working to stop doing this again.  Was quit for 10 years  Vaping Use   Vaping Use: Never used  Substance and Sexual Activity   Alcohol use: Yes    Comment: Occasional use but did drink some the previous week to help wiht anxitety per patient  report    Drug use: No   Sexual activity: Yes    Partners: Male    Birth control/protection: None  Other Topics Concern   Not on file  Social History Narrative   Not on file   Social Determinants of Health   Financial Resource Strain: Low Risk  (10/24/2017)   Overall Financial Resource Strain (CARDIA)    Difficulty of Paying Living  Expenses: Not very hard  Food Insecurity: No Food Insecurity (10/24/2017)   Hunger Vital Sign    Worried About Running Out of Food in the Last Year: Never true    Ran Out of Food in the Last Year: Never true  Transportation Needs: No Transportation Needs (10/24/2017)   PRAPARE - Transportation  Lack of Transportation (Medical): No    Lack of Transportation (Non-Medical): No  Physical Activity: Unknown (10/24/2017)   Exercise Vital Sign    Days of Exercise per Week: 0 days    Minutes of Exercise per Session: Not on file  Stress: Stress Concern Present (10/24/2017)   [place]    Feeling of Stress : Very much  Social Connections: Somewhat Isolated (10/24/2017)   Social Connection and Isolation Panel [NHANES]    Frequency of Communication with Friends and Family: More than three times a week    Frequency of Social Gatherings with Friends and Family: Once a week    Attends Religious Services: Never    Marine scientist or Organizations: Yes    Attends Music therapist: More than 4 times per year    Marital Status: Separated    Allergies: No Known Allergies  Metabolic Disorder Labs: No results found for: "HGBA1C", "MPG" No results found for: "PROLACTIN" Lab Results  Component Value Date   CHOL 293 (HH) 01/02/2007   TRIG 249 (HH) 01/02/2007   HDL 42.5 01/02/2007   CHOLHDL 6.9 CALC 01/02/2007   VLDL 50 (H) 01/02/2007   Lab Results  Component Value Date   TSH 2.180 05/13/2014   TSH 1.49 08/08/2006    Therapeutic Level Labs: No results found for: "LITHIUM" No results found for: "VALPROATE" Lab Results  Component Value Date   CBMZ 10.7 Performed at [facility] 02/02/2007    Current Medications: Current Outpatient Medications  Medication Sig Dispense Refill   ARIPiprazole (ABILIFY) 10 MG tablet Take 1 tablet (10 mg total) by mouth daily. 30 tablet 3   Cholecalciferol (VITAMIN D-3)  5000 UNIT/ML LIQD Place under the tongue daily.     FLUoxetine (PROZAC) 40 MG capsule Take 1 capsule (40 mg total) by mouth daily. 30 capsule 3   oxyCODONE-acetaminophen (PERCOCET/ROXICET) 5-325 MG tablet Take 1 tablet by mouth every 6 (six) hours as needed for severe pain. 12 tablet 0   No current facility-administered medications for this visit.     Musculoskeletal: Strength & Muscle Tone:  Unable to assess due to telephone visit Gait & Station:  Unable to assess due to telephone visit Patient leans: N/A  Psychiatric Specialty Exam: Review of Systems  Last menstrual period 05/14/2014.There is no height or weight on file to calculate BMI.  General Appearance:  Unable to assess due to telephone visit  Eye Contact:   Unable to assess due to telephone visit  Speech:  Clear and Coherent and Normal Rate  Volume:  Normal  Mood:  Euthymic  Affect:  Appropriate and Congruent  Thought Process:  Coherent, Goal Directed and Linear  Orientation:  Full (Time, Place, and Person)  Thought Content: WDL and Logical   Suicidal Thoughts:  No  Homicidal Thoughts:  No  Memory:  Immediate;   Good Recent;   Good Remote;   Good  Judgement:  Good  Insight:  Good  Psychomotor Activity:   Unable to assess due to telephone visit  Concentration:  Concentration: Good and Attention Span: Good  Recall:  Good  Fund of Knowledge: Good  Language: Good  Akathisia:   Unable to assess due to telephone visit  Handed:  Right  AIMS (if indicated): Not done  Assets:  Communication Skills Desire for Improvement Financial Resources/Insurance Housing Social Support  ADL's:  Intact  Cognition: WNL  Sleep:  Good   Screenings: AUDIT    Flowsheet  Row Admission (Discharged) from 05/12/2014 in [place] [facility]  Alcohol Use Disorder Identification Test Final Score (AUDIT) 0      GAD-7    Flowsheet Row Video Visit from 07/24/2022 in [facility] Video  Visit from 04/26/2022 in [facility] Video Visit from 01/24/2022 in [facility] Video Visit from 08/01/2021 in [facility] Video Visit from 05/02/2021 in [facility]  Total GAD-7 Score 3 0 0 0 0      PHQ2-9    Flowsheet Row Video Visit from 07/24/2022 in [place] Video Visit from 04/26/2022 in [facility] Video Visit from 01/24/2022 in [facility] Video Visit from 08/01/2021 in [facility] Video Visit from 05/02/2021 in [facility]  PHQ-2 Total Score 0 2 0 0 1  PHQ-9 Total Score 0 '3 2 1 3      '$ Flowsheet Row ED from 10/22/2021 in [place] Video Visit from 11/28/2020 in [facility] Video Visit from 10/12/2020 in [place] No Risk No Risk No Risk        Assessment and Plan: Patient notes that she is doing well on her current medication regimen despite life stressors.  No medication changes made today.  Patient agreeable to continue medications as prescribed.   1. Moderate episode of recurrent major depressive disorder (HCC) Continue- FLUoxetine (PROZAC) 40 MG capsule; Take 1 capsule (40 mg total) by mouth daily.  Dispense: 30 capsule; Refill: 3 Continue- ARIPiprazole (ABILIFY) 10 MG tablet; Take 1 tablet (10 mg total) by mouth daily.  Dispense: 30 tablet; Refill: 3   Follow up in 3 months Follow up with therapy  Salley Slaughter, NP 07/24/2022, 10:36 AM

## 2022-10-10 ENCOUNTER — Encounter (HOSPITAL_COMMUNITY): Payer: Self-pay | Admitting: Psychiatry

## 2022-10-10 ENCOUNTER — Telehealth (INDEPENDENT_AMBULATORY_CARE_PROVIDER_SITE_OTHER): Payer: BC Managed Care – PPO | Admitting: Psychiatry

## 2022-10-10 DIAGNOSIS — F331 Major depressive disorder, recurrent, moderate: Secondary | ICD-10-CM | POA: Diagnosis not present

## 2022-10-10 MED ORDER — ARIPIPRAZOLE 10 MG PO TABS: 10.0000 mg | ORAL_TABLET | Freq: Every day | ORAL | 3 refills | Status: DC

## 2022-10-10 MED ORDER — FLUOXETINE HCL 40 MG PO CAPS: 40.0000 mg | ORAL_CAPSULE | Freq: Every day | ORAL | 3 refills | Status: DC

## 2022-10-10 NOTE — Progress Notes (Signed)
BH MD/PA/NP OP Progress Note   Virtual Visit via Video Note  I connected with Toni Alvarado on 10/10/22 at  8:30 AM EST by a video enabled telemedicine application and verified that I am speaking with the correct person using two identifiers.  Location: Patient: Class Provider: Clinic   I discussed the limitations of evaluation and management by telemedicine and the availability of in person appointments. The patient expressed understanding and agreed to proceed.  I provided 15 minutes of non-face-to-face time during this encounter.         10/10/2022 8:29 AM Toni Alvarado  MRN:  [id number]  Chief Complaint:  "Im okay"  HPI: 57 year old female seen today for follow up psychiatric evaluation.  She has a psychiatric history of anxiety, PTSD, and depression.  She is currently managed on Prozac 40 mg daily and Abilify 10 mg daily.  She notes that her medications are effective in managing her psychiatric conditions.   Today she was pleasant, cooperative, and engaged in conversation.  She informed Probation officer that she is doing okay.  Patient informed Probation officer that she had limited time for the visit.  She inform her that she was in class.  Patient notes that her medications are effective and informed her that she has minimal anxiety and depression.  Patient informed writer that her significant other recently started chemo and notes that he is doing well.  A GAD-7 and PHQ-9 was not conducted today as patient notes that she had limited time.  These assessments can be done at her next visit.  Today she endorses adequate sleep and appetite.  She denies SI/HI/VAH, mania, paranoia.    No medication changes made today.  Patient agreeable to continue medications as prescribed.  She will follow up with outpatient counseling for therapy.  No other concerns noted at this time.  Visit Diagnosis:    ICD-10-CM   1. Moderate episode of recurrent major depressive disorder (HCC)  F33.1 ARIPiprazole (ABILIFY) 10 MG  tablet    FLUoxetine (PROZAC) 40 MG capsule      Past Psychiatric History: anxiety, PTSD, and depression.   Past Medical History:  Past Medical History:  Diagnosis Date   Anemia    ANEMIA, B12 DEFICIENCY 05/01/2007   Qualifier: Diagnosis of  By: Arnoldo Morale MD, John E    Anxiety    B12 deficiency    Depression    DEPRESSION 04/08/2007   Qualifier: Diagnosis of  By: Jimmye Norman, LPN, Bonnye M    DYSLIPIDEMIA 10/06/2007   Qualifier: Diagnosis of  By: Lurlean Nanny LPN, Regina     Generalized anxiety disorder 05/13/2014   Hx of adenomatous polyp of colon 04/06/2021   single adenoma < 1 cm   INTERNAL HEMORRHOIDS 10/06/2007   Qualifier: Diagnosis of  By: Lurlean Nanny LPN, Regina     Irritable bowel syndrome 10/06/2007   Qualifier: Diagnosis of  By: Lurlean Nanny LPN, Regina     MENORRHAGIA 10/06/2007   Qualifier: Diagnosis of  By: Lurlean Nanny LPN, Donzetta Starch, HX OF 04/08/2007   Qualifier: Diagnosis of  By: Jimmye Norman LPN, Winfield Cunas    OBESITY 10/06/2007   Qualifier: Diagnosis of  By: Lurlean Nanny LPN, Regina     OSTEOPENIA 04/08/2007   Qualifier: Diagnosis of  By: Jimmye Norman LPN, Winfield Cunas    PTSD (post-traumatic stress disorder) 06/06/2018   RECTAL BLEEDING 10/06/2007   Qualifier: Diagnosis of  By: Lurlean Nanny LPN, Regina     Severe episode of recurrent major depressive disorder ([place]) 05/13/2014   Suicidal ideation  05/12/2014    Past Surgical History:  Procedure Laterality Date   COLONOSCOPY     LITHOTRIPSY      Family Psychiatric History: mother depression and alcohol use, father alcohol use and bipolar disorder   Family History:       Family History     Family History:  Family History  Problem Relation Age of Onset   Hyperlipidemia Mother    Mental illness Mother    Alcohol abuse Mother    Depression Mother    Physical abuse Mother    Diabetes Father    Heart disease Father    Hyperlipidemia Father    Stroke Father    Mental illness Father    Alcohol abuse Father    Bipolar disorder Father     Breast cancer Sister    Mental illness Brother    Cancer Brother    ADD / ADHD Brother    Alcohol abuse Brother    Bipolar disorder Brother    Sexual abuse Brother    Colon cancer Maternal Aunt    Alcohol abuse Maternal Aunt    Anxiety disorder Maternal Aunt    Depression Maternal Aunt    Drug abuse Maternal Aunt    Alcohol abuse Maternal Uncle    Alcohol abuse Maternal Grandfather    Alcohol abuse Paternal Grandfather    Depression Daughter        good with Cymbalta   Esophageal cancer Neg Hx    Rectal cancer Neg Hx    Stomach cancer Neg Hx     Social History:  Social History   Socioeconomic History   Marital status: Legally Separated    Spouse name: Not on file   Number of children: Not on file   Years of education: Not on file   Highest education level: Not on file  Occupational History   Not on file  Tobacco Use   Smoking status: Former    Packs/day: 0.50    Types: Cigarettes    Quit date: 03/27/2001    Years since quitting: 21.5   Smokeless tobacco: Never   Tobacco comments:    working to stop doing this again.  Was quit for 10 years  Vaping Use   Vaping Use: Never used  Substance and Sexual Activity   Alcohol use: Yes    Comment: Occasional use but did drink some the previous week to help wiht anxitety per patient  report    Drug use: No   Sexual activity: Yes    Partners: Male    Birth control/protection: None  Other Topics Concern   Not on file  Social History Narrative   Not on file   Social Determinants of Health   Financial Resource Strain: Low Risk  (10/24/2017)   Overall Financial Resource Strain (CARDIA)    Difficulty of Paying Living Expenses: Not very hard  Food Insecurity: No Food Insecurity (10/24/2017)   Hunger Vital Sign    Worried About Running Out of Food in the Last Year: Never true    Ran Out of Food in the Last Year: Never true  Transportation Needs: No Transportation Needs (10/24/2017)   PRAPARE - Radiographer, therapeutic (Medical): No    Lack of Transportation (Non-Medical): No  Physical Activity: Unknown (10/24/2017)   Exercise Vital Sign    Days of Exercise per Week: 0 days    Minutes of Exercise per Session: Not on file  Stress: Stress Concern Present (10/24/2017)   [place]  [facility]    Feeling of Stress : Very much  Social Connections: Somewhat Isolated (10/24/2017)   Social Connection and Isolation Panel [NHANES]    Frequency of Communication with Friends and Family: More than three times a week    Frequency of Social Gatherings with Friends and Family: Once a week    Attends Religious Services: Never    Marine scientist or Organizations: Yes    Attends Music therapist: More than 4 times per year    Marital Status: Separated    Allergies: No Known Allergies  Metabolic Disorder Labs: No results found for: "HGBA1C", "MPG" No results found for: "PROLACTIN" Lab Results  Component Value Date   CHOL 293 (HH) 01/02/2007   TRIG 249 (HH) 01/02/2007   HDL 42.5 01/02/2007   CHOLHDL 6.9 CALC 01/02/2007   VLDL 50 (H) 01/02/2007   Lab Results  Component Value Date   TSH 2.180 05/13/2014   TSH 1.49 08/08/2006    Therapeutic Level Labs: No results found for: "LITHIUM" No results found for: "VALPROATE" Lab Results  Component Value Date   CBMZ 10.7 Performed at [facility] 02/02/2007    Current Medications: Current Outpatient Medications  Medication Sig Dispense Refill   ARIPiprazole (ABILIFY) 10 MG tablet Take 1 tablet (10 mg total) by mouth daily. 30 tablet 3   Cholecalciferol (VITAMIN D-3) 5000 UNIT/ML LIQD Place under the tongue daily.     FLUoxetine (PROZAC) 40 MG capsule Take 1 capsule (40 mg total) by mouth daily. 30 capsule 3   oxyCODONE-acetaminophen (PERCOCET/ROXICET) 5-325 MG tablet Take 1 tablet by mouth every 6 (six) hours as needed for severe pain. 12 tablet 0   No current  facility-administered medications for this visit.     Musculoskeletal: Strength & Muscle Tone: within normal limits and telehealth visit Gait & Station: normal, telehealth visit Patient leans: N/A  Psychiatric Specialty Exam: Review of Systems  Last menstrual period 05/14/2014.There is no height or weight on file to calculate BMI.  General Appearance: Well Groomed  Eye Contact:  Good  Speech:  Clear and Coherent and Normal Rate  Volume:  Normal  Mood:  Euthymic  Affect:  Appropriate and Congruent  Thought Process:  Coherent, Goal Directed and Linear  Orientation:  Full (Time, Place, and Person)  Thought Content: WDL and Logical   Suicidal Thoughts:  No  Homicidal Thoughts:  No  Memory:  Immediate;   Good Recent;   Good Remote;   Good  Judgement:  Good  Insight:  Good  Psychomotor Activity:  Normal  Concentration:  Concentration: Good and Attention Span: Good  Recall:  Good  Fund of Knowledge: Good  Language: Good  Akathisia:  No  Handed:  Right  AIMS (if indicated): Not done  Assets:  Communication Skills Desire for Improvement Financial Resources/Insurance Housing Social Support  ADL's:  Intact  Cognition: WNL  Sleep:  Good   Screenings: AUDIT    Flowsheet Row Admission (Discharged) from 05/12/2014 in [facility]  Alcohol Use Disorder Identification Test Final Score (AUDIT) 0      GAD-7    Flowsheet Row Video Visit from 07/24/2022 in [facility] Video Visit from 04/26/2022 in [facility] Video Visit from 01/24/2022 in [facility] Video Visit from 08/01/2021 in [facility] Video Visit from 05/02/2021 in [facility]  Total GAD-7 Score  3 0 0 0 0      PHQ2-9    Flowsheet Row Video Visit from 07/24/2022 in [facility] Video Visit from 04/26/2022 in [facility] Video Visit from 01/24/2022 in [facility] Video Visit from [facility]01/2021 in [facility] Video Visit from 05/02/2021 in [facility]  PHQ-2 Total Score 0 2 0 0 1  PHQ-9 Total Score 0 3 2 1 3      $ Flowsheet Row ED from 10/22/2021 in [facility] at [facility] Video Visit from 11/28/2020 in [facility] Video Visit from 10/12/2020 in [facility] No Risk No Risk No Risk        Assessment and Plan: Patient notes that she is doing well on her current medication regimen.  No medication changes made today.  Patient agreeable to continue medications as prescribed.   1. Moderate episode of recurrent major depressive disorder (HCC) Continue- FLUoxetine (PROZAC) 40 MG capsule; Take 1 capsule (40 mg total) by mouth daily.  Dispense: 30 capsule; Refill: 3 Continue- ARIPiprazole (ABILIFY) 10 MG tablet; Take 1 tablet (10 mg total) by mouth daily.  Dispense: 30 tablet; Refill: 3   Follow up in 2 months Follow up with therapy  Salley Slaughter, NP 10/10/2022, 8:29 AM

## 2022-12-12 ENCOUNTER — Telehealth (INDEPENDENT_AMBULATORY_CARE_PROVIDER_SITE_OTHER): Payer: BC Managed Care – PPO | Admitting: Psychiatry

## 2022-12-12 ENCOUNTER — Encounter (HOSPITAL_COMMUNITY): Payer: Self-pay | Admitting: Psychiatry

## 2022-12-12 DIAGNOSIS — F331 Major depressive disorder, recurrent, moderate: Secondary | ICD-10-CM | POA: Diagnosis not present

## 2022-12-12 MED ORDER — ARIPIPRAZOLE 10 MG PO TABS: 10.0000 mg | ORAL_TABLET | Freq: Every day | ORAL | 3 refills | Status: DC

## 2022-12-12 MED ORDER — FLUOXETINE HCL 40 MG PO CAPS: 40.0000 mg | ORAL_CAPSULE | Freq: Every day | ORAL | 3 refills | Status: DC

## 2022-12-12 NOTE — Progress Notes (Signed)
BH MD/PA/NP OP Progress Note   Virtual Visit via Video Note  I connected with Toni Alvarado on 12/12/22 at  4:00 PM EDT by a video enabled telemedicine application and verified that I am speaking with the correct person using two identifiers.  Location: Patient: Class Provider: Clinic   I discussed the limitations of evaluation and management by telemedicine and the availability of in person appointments. The patient expressed understanding and agreed to proceed.  I provided 30 minutes of non-face-to-face time during this encounter.         12/12/2022 4:13 PM Toni Alvarado  MRN:  [id number]  Chief Complaint:  "Im okay"  HPI: 57 year old female seen today for follow up psychiatric evaluation.  She has a psychiatric history of anxiety, PTSD, and depression.  She is currently managed on Prozac 40 mg daily and Abilify 10 mg daily.  She notes that her medications are effective in managing her psychiatric conditions.   Today she was pleasant, cooperative, and engaged in conversation.  She informed Clinical research associate that she is doing okay.  Patient reports that her anxiety and depression are well-managed.  Today provider conducted a GAD-7 and patient scored a 0.  Provider also conducted PHQ-9 and patient scored a 9.  She notes that she her appetite has increased.  She denies weight gain.  Patient endorses adequate sleep.  Today she denies SI/HI/AVH, mania, paranoia.   Patient informed Clinical research associate that her boyfriend continues to go through chemo.  She notes that he follows up tomorrow to see if that he was working.    No medication changes made today.  Patient agreeable to continue medications as prescribed.  She will follow up with outpatient counseling for therapy.  No other concerns noted at this time.  Visit Diagnosis:    ICD-10-CM   1. Moderate episode of recurrent major depressive disorder  F33.1 ARIPiprazole (ABILIFY) 10 MG tablet    FLUoxetine (PROZAC) 40 MG capsule      Past Psychiatric  History: anxiety, PTSD, and depression.   Past Medical History:  Past Medical History:  Diagnosis Date   Anemia    ANEMIA, B12 DEFICIENCY 05/01/2007   Qualifier: Diagnosis of  By: Lovell Sheehan MD, John E    Anxiety    B12 deficiency    Depression    DEPRESSION 04/08/2007   Qualifier: Diagnosis of  By: Mayford Knife, LPN, Bonnye M    DYSLIPIDEMIA 10/06/2007   Qualifier: Diagnosis of  By: Pasty Arch LPN, Regina     Generalized anxiety disorder 05/13/2014   Hx of adenomatous polyp of colon 04/06/2021   single adenoma < 1 cm   INTERNAL HEMORRHOIDS 10/06/2007   Qualifier: Diagnosis of  By: Pasty Arch LPN, Regina     Irritable bowel syndrome 10/06/2007   Qualifier: Diagnosis of  By: Pasty Arch LPN, Regina     MENORRHAGIA 10/06/2007   Qualifier: Diagnosis of  By: Pasty Arch LPN, Trevor Mace, HX OF 04/08/2007   Qualifier: Diagnosis of  By: Mayford Knife LPN, Domenic Polite    OBESITY 10/06/2007   Qualifier: Diagnosis of  By: Pasty Arch LPN, Regina     OSTEOPENIA 04/08/2007   Qualifier: Diagnosis of  By: Mayford Knife, LPN, Domenic Polite    PTSD (post-traumatic stress disorder) 06/06/2018   RECTAL BLEEDING 10/06/2007   Qualifier: Diagnosis of  By: Pasty Arch LPN, Regina     Severe episode of recurrent major depressive disorder 05/13/2014   Suicidal ideation 05/12/2014    Past Surgical History:  Procedure Laterality Date   COLONOSCOPY  LITHOTRIPSY      Family Psychiatric History: mother depression and alcohol use, father alcohol use and bipolar disorder   Family History:       Family History     Family History:  Family History  Problem Relation Age of Onset   Hyperlipidemia Mother    Mental illness Mother    Alcohol abuse Mother    Depression Mother    Physical abuse Mother    Diabetes Father    Heart disease Father    Hyperlipidemia Father    Stroke Father    Mental illness Father    Alcohol abuse Father    Bipolar disorder Father    Breast cancer Sister    Mental illness Brother    Cancer Brother    ADD  / ADHD Brother    Alcohol abuse Brother    Bipolar disorder Brother    Sexual abuse Brother    Colon cancer Maternal Aunt    Alcohol abuse Maternal Aunt    Anxiety disorder Maternal Aunt    Depression Maternal Aunt    Drug abuse Maternal Aunt    Alcohol abuse Maternal Uncle    Alcohol abuse Maternal Grandfather    Alcohol abuse Paternal Grandfather    Depression Daughter        good with Cymbalta   Esophageal cancer Neg Hx    Rectal cancer Neg Hx    Stomach cancer Neg Hx     Social History:  Social History   Socioeconomic History   Marital status: Legally Separated    Spouse name: Not on file   Number of children: Not on file   Years of education: Not on file   Highest education level: Not on file  Occupational History   Not on file  Tobacco Use   Smoking status: Former    Packs/day: .5    Types: Cigarettes    Quit date: 03/27/2001    Years since quitting: 21.7   Smokeless tobacco: Never   Tobacco comments:    working to stop doing this again.  Was quit for 10 years  Vaping Use   Vaping Use: Never used  Substance and Sexual Activity   Alcohol use: Yes    Comment: Occasional use but did drink some the previous week to help wiht anxitety per patient  report    Drug use: No   Sexual activity: Yes    Partners: Male    Birth control/protection: None  Other Topics Concern   Not on file  Social History Narrative   Not on file   Social Determinants of Health   Financial Resource Strain: Low Risk  (10/24/2017)   Overall Financial Resource Strain (CARDIA)    Difficulty of Paying Living Expenses: Not very hard  Food Insecurity: No Food Insecurity (10/24/2017)   Hunger Vital Sign    Worried About Running Out of Food in the Last Year: Never true    Ran Out of Food in the Last Year: Never true  Transportation Needs: No Transportation Needs (10/24/2017)   PRAPARE - Administrator, Civil Service (Medical): No    Lack of Transportation (Non-Medical): No   Physical Activity: Unknown (10/24/2017)   Exercise Vital Sign    Days of Exercise per Week: 0 days    Minutes of Exercise per Session: Not on file  Stress: Stress Concern Present (10/24/2017)   [employer] of Occupational Health - Occupational Stress Questionnaire    Feeling of Stress : Very much  Social Connections: Somewhat Isolated (10/24/2017)   Social Connection and Isolation Panel [NHANES]    Frequency of Communication with Friends and Family: More than three times a week    Frequency of Social Gatherings with Friends and Family: Once a week    Attends Religious Services: Never    Database administrator or Organizations: Yes    Attends Engineer, structural: More than 4 times per year    Marital Status: Separated    Allergies: No Known Allergies  Metabolic Disorder Labs: No results found for: "HGBA1C", "MPG" No results found for: "PROLACTIN" Lab Results  Component Value Date   CHOL 293 (HH) 01/02/2007   TRIG 249 (HH) 01/02/2007   HDL 42.5 01/02/2007   CHOLHDL 6.9 CALC 01/02/2007   VLDL 50 (H) 01/02/2007   Lab Results  Component Value Date   TSH 2.180 05/13/2014   TSH 1.49 08/08/2006    Therapeutic Level Labs: No results found for: "LITHIUM" No results found for: "VALPROATE" Lab Results  Component Value Date   CBMZ 10.7 Performed at [facility] 02/02/2007    Current Medications: Current Outpatient Medications  Medication Sig Dispense Refill   ARIPiprazole (ABILIFY) 10 MG tablet Take 1 tablet (10 mg total) by mouth daily. 30 tablet 3   Cholecalciferol (VITAMIN D-3) 5000 UNIT/ML LIQD Place under the tongue daily.     FLUoxetine (PROZAC) 40 MG capsule Take 1 capsule (40 mg total) by mouth daily. 30 capsule 3   oxyCODONE-acetaminophen (PERCOCET/ROXICET) 5-325 MG tablet Take 1 tablet by mouth every 6 (six) hours as needed for severe pain. 12 tablet 0   No current facility-administered medications for this visit.      Musculoskeletal: Strength & Muscle Tone: within normal limits and telehealth visit Gait & Station: normal, telehealth visit Patient leans: N/A  Psychiatric Specialty Exam: Review of Systems  Last menstrual period 05/14/2014.There is no height or weight on file to calculate BMI.  General Appearance: Well Groomed  Eye Contact:  Good  Speech:  Clear and Coherent and Normal Rate  Volume:  Normal  Mood:  Euthymic  Affect:  Appropriate and Congruent  Thought Process:  Coherent, Goal Directed and Linear  Orientation:  Full (Time, Place, and Person)  Thought Content: WDL and Logical   Suicidal Thoughts:  No  Homicidal Thoughts:  No  Memory:  Immediate;   Good Recent;   Good Remote;   Good  Judgement:  Good  Insight:  Good  Psychomotor Activity:  Normal  Concentration:  Concentration: Good and Attention Span: Good  Recall:  Good  Fund of Knowledge: Good  Language: Good  Akathisia:  No  Handed:  Right  AIMS (if indicated): Not done  Assets:  Communication Skills Desire for Improvement Financial Resources/Insurance Housing Social Support  ADL's:  Intact  Cognition: WNL  Sleep:  Good   Screenings: AUDIT    Flowsheet Row [facility]  Alcohol Use Disorder Identification Test Final Score (AUDIT) 0      GAD-7    Flowsheet Row Video Visit from 12/12/2022 in [facility] Video Visit from 07/24/2022 in [facility] Video Visit from 04/26/2022 in [facility] Video Visit from 01/24/2022 in [facility] Video Visit from 08/01/2021 in [facility]  Total GAD-7 Score 0 3 0 0 0      PHQ2-9    Flowsheet Row Video Visit  from 12/12/2022 in [facility] Video Visit from 07/24/2022 in [facility] Video Visit from  04/26/2022 in [facility] Video Visit from 01/24/2022 in [facility] Video Visit from 08/01/2021 in [facility]  PHQ-2 Total Score 2 0 2 0 0  PHQ-9 Total Score 9 0 3 2 1       Flowsheet Row ED from 10/22/2021 in [facility] Emergency Department at [facility] Video Visit from 11/28/2020 in [facility] Video Visit from 10/12/2020 in [facility]  C-SSRS RISK CATEGORY No Risk No Risk No Risk        Assessment and Plan: Patient notes that she is doing well on her current medication regimen.  No medication changes made today.  Patient agreeable to continue medications as prescribed.   1. Moderate episode of recurrent major depressive disorder (HCC) Continue- FLUoxetine (PROZAC) 40 MG capsule; Take 1 capsule (40 mg total) by mouth daily.  Dispense: 30 capsule; Refill: 3 Continue- ARIPiprazole (ABILIFY) 10 MG tablet; Take 1 tablet (10 mg total) by mouth daily.  Dispense: 30 tablet; Refill: 3   Follow up in 2 months Follow up with therapy  Shanna Cisco, NP 12/12/2022, 4:13 PM

## 2023-02-20 ENCOUNTER — Telehealth (INDEPENDENT_AMBULATORY_CARE_PROVIDER_SITE_OTHER): Payer: BC Managed Care – PPO | Admitting: Psychiatry

## 2023-02-20 ENCOUNTER — Encounter (HOSPITAL_COMMUNITY): Payer: Self-pay | Admitting: Psychiatry

## 2023-02-20 DIAGNOSIS — F331 Major depressive disorder, recurrent, moderate: Secondary | ICD-10-CM | POA: Diagnosis not present

## 2023-02-20 MED ORDER — ARIPIPRAZOLE 10 MG PO TABS: 10.0000 mg | ORAL_TABLET | Freq: Every day | ORAL | 3 refills | Status: DC

## 2023-02-20 MED ORDER — FLUOXETINE HCL 40 MG PO CAPS: 40.0000 mg | ORAL_CAPSULE | Freq: Every day | ORAL | 3 refills | Status: DC

## 2023-02-20 NOTE — Progress Notes (Signed)
BH MD/PA/NP OP Progress Note   Virtual Visit via Video Note  I connected with Toni Alvarado on 02/20/23 at  4:00 PM EDT by a video enabled telemedicine application and verified that I am speaking with the correct person using two identifiers.  Location: Patient: Home Provider: Clinic   I discussed the limitations of evaluation and management by telemedicine and the availability of in person appointments. The patient expressed understanding and agreed to proceed.  I provided 30 minutes of non-face-to-face time during this encounter.         02/20/2023 4:06 PM Toni Alvarado  MRN:  [id number]  Chief Complaint:  "I try to do self care"  HPI: 57 year old female seen today for follow up psychiatric evaluation.  She has a psychiatric history of anxiety, PTSD, and depression.  She is currently managed on Prozac 40 mg daily and Abilify 10 mg daily.  She notes that her medications are effective in managing her psychiatric conditions.   Today she was pleasant, cooperative, and engaged in conversation.  She informed Clinical research associate that she has been trying to do more self-care.  She notes that she is out of school for the summer but it has been busy taking her son who has Lyme disease and her fianc who has cancer to their various appointments.  Patient also informed writer that recently she found out that her future mother-in-law has colon cancer.  Despite the stressors patient notes that she trust in God and has minimal anxiety and depression.  She notes that she copes with the above by going swimming once a week, reading, hiking, and walking in the park.  Today provider conducted a GAD-7 and patient scored a 0, at her last visit she scored a 0.  Provider also conducted PHQ-9 and patient scored a 0, at her last visit she scored a 9.  She endorses adequate sleep and appetite.  Patient notes that she is lost a few pounds since her last visit.  Today she denies SI/HI/VAH, mania, or paranoia.   No medication  changes made today.  Patient agreeable to continue medications as prescribed.  She will follow up with outpatient counseling for therapy.  No other concerns noted at this time.  Visit Diagnosis:    ICD-10-CM   1. Moderate episode of recurrent major depressive disorder (HCC)  F33.1 ARIPiprazole (ABILIFY) 10 MG tablet    FLUoxetine (PROZAC) 40 MG capsule      Past Psychiatric History: anxiety, PTSD, and depression.   Past Medical History:  Past Medical History:  Diagnosis Date   Anemia    ANEMIA, B12 DEFICIENCY 05/01/2007   Qualifier: Diagnosis of  By: Lovell Sheehan MD, John E    Anxiety    B12 deficiency    Depression    DEPRESSION 04/08/2007   Qualifier: Diagnosis of  By: Mayford Knife, LPN, Bonnye M    DYSLIPIDEMIA 10/06/2007   Qualifier: Diagnosis of  By: Pasty Arch LPN, Regina     Generalized anxiety disorder 05/13/2014   Hx of adenomatous polyp of colon 04/06/2021   single adenoma < 1 cm   INTERNAL HEMORRHOIDS 10/06/2007   Qualifier: Diagnosis of  By: Pasty Arch LPN, Regina     Irritable bowel syndrome 10/06/2007   Qualifier: Diagnosis of  By: Pasty Arch LPN, Regina     MENORRHAGIA 10/06/2007   Qualifier: Diagnosis of  By: Pasty Arch LPN, Trevor Mace, HX OF 04/08/2007   Qualifier: Diagnosis of  By: Mayford Knife, LPN, Domenic Polite    OBESITY 10/06/2007  Qualifier: Diagnosis of  By: Pasty Arch LPN, Regina     OSTEOPENIA 04/08/2007   Qualifier: Diagnosis of  By: Mayford Knife, LPN, Domenic Polite    PTSD (post-traumatic stress disorder) 06/06/2018   RECTAL BLEEDING 10/06/2007   Qualifier: Diagnosis of  By: Pasty Arch LPN, Regina     Severe episode of recurrent major depressive disorder (HCC) 05/13/2014   Suicidal ideation 05/12/2014    Past Surgical History:  Procedure Laterality Date   COLONOSCOPY     LITHOTRIPSY      Family Psychiatric History: mother depression and alcohol use, father alcohol use and bipolar disorder   Family History:       Family History     Family History:  Family History  Problem  Relation Age of Onset   Hyperlipidemia Mother    Mental illness Mother    Alcohol abuse Mother    Depression Mother    Physical abuse Mother    Diabetes Father    Heart disease Father    Hyperlipidemia Father    Stroke Father    Mental illness Father    Alcohol abuse Father    Bipolar disorder Father    Breast cancer Sister    Mental illness Brother    Cancer Brother    ADD / ADHD Brother    Alcohol abuse Brother    Bipolar disorder Brother    Sexual abuse Brother    Colon cancer Maternal Aunt    Alcohol abuse Maternal Aunt    Anxiety disorder Maternal Aunt    Depression Maternal Aunt    Drug abuse Maternal Aunt    Alcohol abuse Maternal Uncle    Alcohol abuse Maternal Grandfather    Alcohol abuse Paternal Grandfather    Depression Daughter        good with Cymbalta   Esophageal cancer Neg Hx    Rectal cancer Neg Hx    Stomach cancer Neg Hx     Social History:  Social History   Socioeconomic History   Marital status: Legally Separated    Spouse name: Not on file   Number of children: Not on file   Years of education: Not on file   Highest education level: Not on file  Occupational History   Not on file  Tobacco Use   Smoking status: Former    Packs/day: .5    Types: Cigarettes    Quit date: 03/27/2001    Years since quitting: 21.9   Smokeless tobacco: Never   Tobacco comments:    working to stop doing this again.  Was quit for 10 years  Vaping Use   Vaping Use: Never used  Substance and Sexual Activity   Alcohol use: Yes    Comment: Occasional use but did drink some the previous week to help wiht anxitety per patient  report    Drug use: No   Sexual activity: Yes    Partners: Male    Birth control/protection: None  Other Topics Concern   Not on file  Social History Narrative   Not on file   Social Determinants of Health   Financial Resource Strain: Low Risk  (10/24/2017)   Overall Financial Resource Strain (CARDIA)    Difficulty of Paying Living  Expenses: Not very hard  Food Insecurity: No Food Insecurity (10/24/2017)   Hunger Vital Sign    Worried About Running Out of Food in the Last Year: Never true    Ran Out of Food in the Last Year: Never true  Transportation Needs:  No Transportation Needs (10/24/2017)   PRAPARE - Administrator, Civil Service (Medical): No    Lack of Transportation (Non-Medical): No  Physical Activity: Unknown (10/24/2017)   Exercise Vital Sign    Days of Exercise per Week: 0 days    Minutes of Exercise per Session: Not on file  Stress: Stress Concern Present (10/24/2017)   [employer] of Occupational Health - Occupational Stress Questionnaire    Feeling of Stress : Very much  Social Connections: Somewhat Isolated (10/24/2017)   Social Connection and Isolation Panel [NHANES]    Frequency of Communication with Friends and Family: More than three times a week    Frequency of Social Gatherings with Friends and Family: Once a week    Attends Religious Services: Never    Database administrator or Organizations: Yes    Attends Engineer, structural: More than 4 times per year    Marital Status: Separated    Allergies: No Known Allergies  Metabolic Disorder Labs: No results found for: "HGBA1C", "MPG" No results found for: "PROLACTIN" Lab Results  Component Value Date   CHOL 293 (HH) 01/02/2007   TRIG 249 (HH) 01/02/2007   HDL 42.5 01/02/2007   CHOLHDL 6.9 CALC 01/02/2007   VLDL 50 (H) 01/02/2007   Lab Results  Component Value Date   TSH 2.180 05/13/2014   TSH 1.49 08/08/2006    Therapeutic Level Labs: No results found for: "LITHIUM" No results found for: "VALPROATE" Lab Results  Component Value Date   CBMZ 10.7 Performed at [facility] 02/02/2007    Current Medications: Current Outpatient Medications  Medication Sig Dispense Refill   ARIPiprazole (ABILIFY) 10 MG tablet Take 1 tablet (10 mg total) by mouth daily. 30 tablet 3   Cholecalciferol (VITAMIN D-3)  5000 UNIT/ML LIQD Place under the tongue daily.     FLUoxetine (PROZAC) 40 MG capsule Take 1 capsule (40 mg total) by mouth daily. 30 capsule 3   oxyCODONE-acetaminophen (PERCOCET/ROXICET) 5-325 MG tablet Take 1 tablet by mouth every 6 (six) hours as needed for severe pain. 12 tablet 0   No current facility-administered medications for this visit.     Musculoskeletal: Strength & Muscle Tone: within normal limits and telehealth visit Gait & Station: normal, telehealth visit Patient leans: N/A  Psychiatric Specialty Exam: Review of Systems  Last menstrual period 05/14/2014.There is no height or weight on file to calculate BMI.  General Appearance: Well Groomed  Eye Contact:  Good  Speech:  Clear and Coherent and Normal Rate  Volume:  Normal  Mood:  Euthymic  Affect:  Appropriate and Congruent  Thought Process:  Coherent, Goal Directed and Linear  Orientation:  Full (Time, Place, and Person)  Thought Content: WDL and Logical   Suicidal Thoughts:  No  Homicidal Thoughts:  No  Memory:  Immediate;   Good Recent;   Good Remote;   Good  Judgement:  Good  Insight:  Good  Psychomotor Activity:  Normal  Concentration:  Concentration: Good and Attention Span: Good  Recall:  Good  Fund of Knowledge: Good  Language: Good  Akathisia:  No  Handed:  Right  AIMS (if indicated): Not done  Assets:  Communication Skills Desire for Improvement Financial Resources/Insurance Housing Social Support  ADL's:  Intact  Cognition: WNL  Sleep:  Good   Screenings: AUDIT    Flowsheet Row [facility]  Alcohol Use Disorder Identification Test Final Score (AUDIT) 0  GAD-7    Flowsheet Row Video Visit from 02/20/2023 in [facility] Video Visit from 12/12/2022 in [facility] Video Visit from 07/24/2022 in [facility] Video Visit from  04/26/2022 in [facility] Video Visit from 01/24/2022 in [facility]  Total GAD-7 Score 0 0 3 0 0      PHQ2-9    Flowsheet Row Video Visit from 02/20/2023 in [facility] Video Visit from 12/12/2022 in [facility] Video Visit from 07/24/2022 in [facility] Video Visit from 04/26/2022 in [facility] Video Visit from 01/24/2022 in [facility]  PHQ-2 Total Score 0 2 0 2 0  PHQ-9 Total Score 0 9 0 3 2      Flowsheet Row ED from 10/22/2021 in [facility] Emergency Department at [facility] Video Visit from 11/28/2020 in [facility] Video Visit from 10/12/2020 in [facility]  C-SSRS RISK CATEGORY No Risk No Risk No Risk        Assessment and Plan: Patient notes that she is doing well on her current medication regimen.  No medication changes made today.  Patient agreeable to continue medications as prescribed.   1. Moderate episode of recurrent major depressive disorder (HCC) Continue- FLUoxetine (PROZAC) 40 MG capsule; Take 1 capsule (40 mg total) by mouth daily.  Dispense: 30 capsule; Refill: 3 Continue- ARIPiprazole (ABILIFY) 10 MG tablet; Take 1 tablet (10 mg total) by mouth daily.  Dispense: 30 tablet; Refill: 3   Follow up in 3 months Follow up with therapy  Shanna Cisco, NP 02/20/2023, 4:06 PM

## 2023-05-15 ENCOUNTER — Telehealth (INDEPENDENT_AMBULATORY_CARE_PROVIDER_SITE_OTHER): Payer: BC Managed Care – PPO | Admitting: Psychiatry

## 2023-05-15 ENCOUNTER — Encounter (HOSPITAL_COMMUNITY): Payer: Self-pay | Admitting: Psychiatry

## 2023-05-15 DIAGNOSIS — F331 Major depressive disorder, recurrent, moderate: Secondary | ICD-10-CM

## 2023-05-15 MED ORDER — FLUOXETINE HCL 40 MG PO CAPS: 40.0000 mg | ORAL_CAPSULE | Freq: Every day | ORAL | 3 refills | Status: DC

## 2023-05-15 MED ORDER — ARIPIPRAZOLE 10 MG PO TABS: 10.0000 mg | ORAL_TABLET | Freq: Every day | ORAL | 3 refills | Status: DC

## 2023-05-15 NOTE — Progress Notes (Signed)
BH MD/PA/NP OP Progress Note   Virtual Visit via Video Note  I connected with Toni Alvarado on 05/15/23 at  3:30 PM EDT by a video enabled telemedicine application and verified that I am speaking with the correct person using two identifiers.  Location: Patient: Home Provider: Clinic   I discussed the limitations of evaluation and management by telemedicine and the availability of in person appointments. The patient expressed understanding and agreed to proceed.  I provided 30 minutes of non-face-to-face time during this encounter.         05/15/2023 3:42 PM Toni Alvarado  MRN:  [id number]  Chief Complaint:  "I am doing okay"  HPI: 57 year old female seen today for follow up psychiatric evaluation.  She has a psychiatric history of anxiety, PTSD, and depression.  She is currently managed on Prozac 40 mg daily and Abilify 10 mg daily.  She notes that her medications are effective in managing her psychiatric conditions.   Today she was pleasant, cooperative, and engaged in conversation.  She informed Clinical research associate that she has been trying to do more self-care.  She notes that she is doing okay. She reports that her mood is stable and notes that she has minimal anxiety and depression. At times she notes that she worries about her  son who has Lyme disease,  her fianc who has cancer, her future mother in law who has cancer and her sister who has breast cancer.    Despite the above stressors patient notes that she trust in God and copes with the above with self care( reading, hiking, and walking in the park).  Today provider conducted a GAD-7 and patient scored a 1, at her last visit she scored a 0.  Provider also conducted PHQ-9 and patient scored a 2, at her last visit she scored a 0.  She endorses adequate sleep and appetite.  Today she denies SI/HI/VAH, mania, or paranoia.   No medication changes made today.  Patient agreeable to continue medications as prescribed.  She will follow up with  outpatient counseling for therapy.  No other concerns noted at this time.  Visit Diagnosis:    ICD-10-CM   1. Moderate episode of recurrent major depressive disorder (HCC)  F33.1 ARIPiprazole (ABILIFY) 10 MG tablet    FLUoxetine (PROZAC) 40 MG capsule       Past Psychiatric History: anxiety, PTSD, and depression.   Past Medical History:  Past Medical History:  Diagnosis Date   Anemia    ANEMIA, B12 DEFICIENCY 05/01/2007   Qualifier: Diagnosis of  By: Lovell Sheehan MD, John E    Anxiety    B12 deficiency    Depression    DEPRESSION 04/08/2007   Qualifier: Diagnosis of  By: Mayford Knife, LPN, Bonnye M    DYSLIPIDEMIA 10/06/2007   Qualifier: Diagnosis of  By: Pasty Arch LPN, Regina     Generalized anxiety disorder 05/13/2014   Hx of adenomatous polyp of colon 04/06/2021   single adenoma < 1 cm   INTERNAL HEMORRHOIDS 10/06/2007   Qualifier: Diagnosis of  By: Pasty Arch LPN, Regina     Irritable bowel syndrome 10/06/2007   Qualifier: Diagnosis of  By: Pasty Arch LPN, Regina     MENORRHAGIA 10/06/2007   Qualifier: Diagnosis of  By: Pasty Arch LPN, Trevor Mace, HX OF 04/08/2007   Qualifier: Diagnosis of  By: Mayford Knife LPN, Domenic Polite    OBESITY 10/06/2007   Qualifier: Diagnosis of  By: Pasty Arch LPN, Regina     OSTEOPENIA 04/08/2007  Qualifier: Diagnosis of  By: Mayford Knife, LPN, Domenic Polite    PTSD (post-traumatic stress disorder) 06/06/2018   RECTAL BLEEDING 10/06/2007   Qualifier: Diagnosis of  By: Pasty Arch LPN, Regina     Severe episode of recurrent major depressive disorder (HCC) 05/13/2014   Suicidal ideation 05/12/2014    Past Surgical History:  Procedure Laterality Date   COLONOSCOPY     LITHOTRIPSY      Family Psychiatric History: mother depression and alcohol use, father alcohol use and bipolar disorder   Family History:       Family History     Family History:  Family History  Problem Relation Age of Onset   Hyperlipidemia Mother    Mental illness Mother    Alcohol abuse Mother     Depression Mother    Physical abuse Mother    Diabetes Father    Heart disease Father    Hyperlipidemia Father    Stroke Father    Mental illness Father    Alcohol abuse Father    Bipolar disorder Father    Breast cancer Sister    Mental illness Brother    Cancer Brother    ADD / ADHD Brother    Alcohol abuse Brother    Bipolar disorder Brother    Sexual abuse Brother    Colon cancer Maternal Aunt    Alcohol abuse Maternal Aunt    Anxiety disorder Maternal Aunt    Depression Maternal Aunt    Drug abuse Maternal Aunt    Alcohol abuse Maternal Uncle    Alcohol abuse Maternal Grandfather    Alcohol abuse Paternal Grandfather    Depression Daughter        good with Cymbalta   Esophageal cancer Neg Hx    Rectal cancer Neg Hx    Stomach cancer Neg Hx     Social History:  Social History   Socioeconomic History   Marital status: Legally Separated    Spouse name: Not on file   Number of children: Not on file   Years of education: Not on file   Highest education level: Not on file  Occupational History   Not on file  Tobacco Use   Smoking status: Former    Current packs/day: 0.00    Types: Cigarettes    Quit date: 03/27/2001    Years since quitting: 22.1   Smokeless tobacco: Never   Tobacco comments:    working to stop doing this again.  Was quit for 10 years  Vaping Use   Vaping status: Never Used  Substance and Sexual Activity   Alcohol use: Yes    Comment: Occasional use but did drink some the previous week to help wiht anxitety per patient  report    Drug use: No   Sexual activity: Yes    Partners: Male    Birth control/protection: None  Other Topics Concern   Not on file  Social History Narrative   Not on file   Social Determinants of Health   Financial Resource Strain: Low Risk  (10/24/2017)   Overall Financial Resource Strain (CARDIA)    Difficulty of Paying Living Expenses: Not very hard  Food Insecurity: No Food Insecurity (10/24/2017)   Hunger Vital  Sign    Worried About Running Out of Food in the Last Year: Never true    Ran Out of Food in the Last Year: Never true  Transportation Needs: No Transportation Needs (10/24/2017)   PRAPARE - Transportation    Lack of Transportation (  Medical): No    Lack of Transportation (Non-Medical): No  Physical Activity: Unknown (10/24/2017)   Exercise Vital Sign    Days of Exercise per Week: 0 days    Minutes of Exercise per Session: Not on file  Stress: Stress Concern Present (10/24/2017)   [employer] of Occupational Health - Occupational Stress Questionnaire    Feeling of Stress : Very much  Social Connections: Somewhat Isolated (10/24/2017)   Social Connection and Isolation Panel [NHANES]    Frequency of Communication with Friends and Family: More than three times a week    Frequency of Social Gatherings with Friends and Family: Once a week    Attends Religious Services: Never    Database administrator or Organizations: Yes    Attends Engineer, structural: More than 4 times per year    Marital Status: Separated    Allergies: No Known Allergies  Metabolic Disorder Labs: No results found for: "HGBA1C", "MPG" No results found for: "PROLACTIN" Lab Results  Component Value Date   CHOL 293 (HH) 01/02/2007   TRIG 249 (HH) 01/02/2007   HDL 42.5 01/02/2007   CHOLHDL 6.9 CALC 01/02/2007   VLDL 50 (H) 01/02/2007   Lab Results  Component Value Date   TSH 2.180 05/13/2014   TSH 1.49 08/08/2006    Therapeutic Level Labs: No results found for: "LITHIUM" No results found for: "VALPROATE" Lab Results  Component Value Date   CBMZ 10.7 Performed at [facility] 02/02/2007    Current Medications: Current Outpatient Medications  Medication Sig Dispense Refill   ARIPiprazole (ABILIFY) 10 MG tablet Take 1 tablet (10 mg total) by mouth daily. 30 tablet 3   Cholecalciferol (VITAMIN D-3) 5000 UNIT/ML LIQD Place under the tongue daily.     FLUoxetine (PROZAC) 40 MG capsule Take  1 capsule (40 mg total) by mouth daily. 30 capsule 3   oxyCODONE-acetaminophen (PERCOCET/ROXICET) 5-325 MG tablet Take 1 tablet by mouth every 6 (six) hours as needed for severe pain. 12 tablet 0   No current facility-administered medications for this visit.     Musculoskeletal: Strength & Muscle Tone: within normal limits and telehealth visit Gait & Station: normal, telehealth visit Patient leans: N/A  Psychiatric Specialty Exam: Review of Systems  Last menstrual period 05/14/2014.There is no height or weight on file to calculate BMI.  General Appearance: Well Groomed  Eye Contact:  Good  Speech:  Clear and Coherent and Normal Rate  Volume:  Normal  Mood:  Euthymic  Affect:  Appropriate and Congruent  Thought Process:  Coherent, Goal Directed and Linear  Orientation:  Full (Time, Place, and Person)  Thought Content: WDL and Logical   Suicidal Thoughts:  No  Homicidal Thoughts:  No  Memory:  Immediate;   Good Recent;   Good Remote;   Good  Judgement:  Good  Insight:  Good  Psychomotor Activity:  Normal  Concentration:  Concentration: Good and Attention Span: Good  Recall:  Good  Fund of Knowledge: Good  Language: Good  Akathisia:  No  Handed:  Right  AIMS (if indicated): Not done  Assets:  Communication Skills Desire for Improvement Financial Resources/Insurance Housing Social Support  ADL's:  Intact  Cognition: WNL  Sleep:  Good   Screenings: AUDIT    Flowsheet Row [facility]  Alcohol Use Disorder Identification Test Final Score (AUDIT) 0      GAD-7    Flowsheet Row Video Visit from 05/15/2023  in [facility] Video Visit from 02/20/2023 in [facility] Video Visit from 12/12/2022 in [facility] Video Visit from 07/24/2022 in [facility] Video Visit from 04/26/2022 in [facility]  Total GAD-7 Score 1 0 0 3 0      PHQ2-9    Flowsheet Row Video Visit from 05/15/2023 in [facility] Video Visit from 02/20/2023 in [facility] Video Visit from 12/12/2022 in [facility] Video Visit from 07/24/2022 in [facility] Video Visit from 04/26/2022 in [facility]  PHQ-2 Total Score 0 0 2 0 2  PHQ-9 Total Score 2 0 9 0 3      Flowsheet Row ED from 10/22/2021 in [facility] Emergency Department at [facility] Video Visit from 11/28/2020 in [facility] Video Visit from 10/12/2020 in [facility]  C-SSRS RISK CATEGORY No Risk No Risk No Risk        Assessment and Plan: Patient notes that she is doing well on her current medication regimen.  No medication changes made today.  Patient agreeable to continue medications as prescribed.   1. Moderate episode of recurrent major depressive disorder (HCC) Continue- FLUoxetine (PROZAC) 40 MG capsule; Take 1 capsule (40 mg total) by mouth daily.  Dispense: 30 capsule; Refill: 3 Continue- ARIPiprazole (ABILIFY) 10 MG tablet; Take 1 tablet (10 mg total) by mouth daily.  Dispense: 30 tablet; Refill: 3   Follow up in 3 months Follow up with therapy  Shanna Cisco, NP 05/15/2023, 3:42 PM

## 2023-07-18 ENCOUNTER — Encounter (HOSPITAL_COMMUNITY): Payer: Self-pay | Admitting: Psychiatry

## 2023-07-18 ENCOUNTER — Telehealth (HOSPITAL_COMMUNITY): Payer: BC Managed Care – PPO | Admitting: Psychiatry

## 2023-07-18 DIAGNOSIS — F331 Major depressive disorder, recurrent, moderate: Secondary | ICD-10-CM | POA: Diagnosis not present

## 2023-07-18 MED ORDER — ARIPIPRAZOLE 10 MG PO TABS: 10.0000 mg | ORAL_TABLET | Freq: Every day | ORAL | 3 refills | Status: DC

## 2023-07-18 MED ORDER — FLUOXETINE HCL 20 MG PO CAPS: 60.0000 mg | ORAL_CAPSULE | Freq: Every day | ORAL | 3 refills | Status: DC

## 2023-07-18 NOTE — Progress Notes (Signed)
BH MD/PA/NP OP Progress Note   Virtual Visit via Video Note  I connected with Toni Alvarado on 07/18/23 at  3:30 PM EST by a video enabled telemedicine application and verified that I am speaking with the correct person using two identifiers.  Location: Patient: Home Provider: Clinic   I discussed the limitations of evaluation and management by telemedicine and the availability of in person appointments. The patient expressed understanding and agreed to proceed.  I provided 30 minutes of non-face-to-face time during this encounter.         07/18/2023 11:47 AM Toni Alvarado  MRN:  [id number]  Chief Complaint:  "I am feeling down"  HPI: 57 year old female seen today for follow up psychiatric evaluation.  She has a psychiatric history of anxiety, PTSD, and depression.  She is currently managed on Prozac 40 mg daily and Abilify 10 mg daily.  She notes that her medications are effective in managing her psychiatric conditions.   Today she was pleasant, cooperative, and engaged in conversation.  She informed Clinical research associate that she has been feeling down.  She notes that she is concerned about her significant other who has cancer, her son who has lyme disease, and her future mother-in-law who also has cancer.  Patient notes that she is overwhelmed at times.  Provider recommended patient speak into a therapist.  She was agreeable to this.   Since her last visit she informed writer that her anxiety and depression has somewhat increased.  Provider conducted a GAD-7 and patient scored a 4, at her last visit she scored a 1.  Provider also conducted PHQ-9 and patient scored a 12, at her last she scored a 2 she endorses adequate sleep and appetite. Today she denies SI/HI/VAH, mania, or paranoia.   Today Prozac 40 mg increased to 60 mg to help manage her anxiety and depression.  She will continue her other medications as described and follow-up with outpatient counseling for therapy.  No other concerns at  this time.    Visit Diagnosis:    ICD-10-CM   1. Moderate episode of recurrent major depressive disorder (HCC)  F33.1 ARIPiprazole (ABILIFY) 10 MG tablet    FLUoxetine (PROZAC) 20 MG capsule        Past Psychiatric History: anxiety, PTSD, and depression.   Past Medical History:  Past Medical History:  Diagnosis Date   Anemia    ANEMIA, B12 DEFICIENCY 05/01/2007   Qualifier: Diagnosis of  By: Lovell Sheehan MD, John E    Anxiety    B12 deficiency    Depression    DEPRESSION 04/08/2007   Qualifier: Diagnosis of  By: Mayford Knife, LPN, Bonnye M    DYSLIPIDEMIA 10/06/2007   Qualifier: Diagnosis of  By: Pasty Arch LPN, Regina     Generalized anxiety disorder 05/13/2014   Hx of adenomatous polyp of colon 04/06/2021   single adenoma < 1 cm   INTERNAL HEMORRHOIDS 10/06/2007   Qualifier: Diagnosis of  By: Pasty Arch LPN, Regina     Irritable bowel syndrome 10/06/2007   Qualifier: Diagnosis of  By: Pasty Arch LPN, Regina     MENORRHAGIA 10/06/2007   Qualifier: Diagnosis of  By: Pasty Arch LPN, Trevor Mace, HX OF 04/08/2007   Qualifier: Diagnosis of  By: Mayford Knife LPN, Domenic Polite    OBESITY 10/06/2007   Qualifier: Diagnosis of  By: Pasty Arch LPN, Regina     OSTEOPENIA 04/08/2007   Qualifier: Diagnosis of  By: Mayford Knife LPN, Domenic Polite    PTSD (post-traumatic stress disorder) 06/06/2018  RECTAL BLEEDING 10/06/2007   Qualifier: Diagnosis of  By: Pasty Arch LPN, Regina     Severe episode of recurrent major depressive disorder (HCC) 05/13/2014   Suicidal ideation 05/12/2014    Past Surgical History:  Procedure Laterality Date   COLONOSCOPY     LITHOTRIPSY      Family Psychiatric History: mother depression and alcohol use, father alcohol use and bipolar disorder   Family History:       Family History     Family History:  Family History  Problem Relation Age of Onset   Hyperlipidemia Mother    Mental illness Mother    Alcohol abuse Mother    Depression Mother    Physical abuse Mother    Diabetes  Father    Heart disease Father    Hyperlipidemia Father    Stroke Father    Mental illness Father    Alcohol abuse Father    Bipolar disorder Father    Breast cancer Sister    Mental illness Brother    Cancer Brother    ADD / ADHD Brother    Alcohol abuse Brother    Bipolar disorder Brother    Sexual abuse Brother    Colon cancer Maternal Aunt    Alcohol abuse Maternal Aunt    Anxiety disorder Maternal Aunt    Depression Maternal Aunt    Drug abuse Maternal Aunt    Alcohol abuse Maternal Uncle    Alcohol abuse Maternal Grandfather    Alcohol abuse Paternal Grandfather    Depression Daughter        good with Cymbalta   Esophageal cancer Neg Hx    Rectal cancer Neg Hx    Stomach cancer Neg Hx     Social History:  Social History   Socioeconomic History   Marital status: Legally Separated    Spouse name: Not on file   Number of children: Not on file   Years of education: Not on file   Highest education level: Not on file  Occupational History   Not on file  Tobacco Use   Smoking status: Former    Current packs/day: 0.00    Types: Cigarettes    Quit date: 03/27/2001    Years since quitting: 22.3   Smokeless tobacco: Never   Tobacco comments:    working to stop doing this again.  Was quit for 10 years  Vaping Use   Vaping status: Never Used  Substance and Sexual Activity   Alcohol use: Yes    Comment: Occasional use but did drink some the previous week to help wiht anxitety per patient  report    Drug use: No   Sexual activity: Yes    Partners: Male    Birth control/protection: None  Other Topics Concern   Not on file  Social History Narrative   Not on file   Social Determinants of Health   Financial Resource Strain: Low Risk  (10/24/2017)   Overall Financial Resource Strain (CARDIA)    Difficulty of Paying Living Expenses: Not very hard  Food Insecurity: No Food Insecurity (10/24/2017)   Hunger Vital Sign    Worried About Running Out of Food in the Last  Year: Never true    Ran Out of Food in the Last Year: Never true  Transportation Needs: No Transportation Needs (10/24/2017)   PRAPARE - Administrator, Civil Service (Medical): No    Lack of Transportation (Non-Medical): No  Physical Activity: Unknown (10/24/2017)   Exercise Vital  Sign    Days of Exercise per Week: 0 days    Minutes of Exercise per Session: Not on file  Stress: Stress Concern Present (10/24/2017)   [employer] of Occupational Health - Occupational Stress Questionnaire    Feeling of Stress : Very much  Social Connections: Somewhat Isolated (10/24/2017)   Social Connection and Isolation Panel [NHANES]    Frequency of Communication with Friends and Family: More than three times a week    Frequency of Social Gatherings with Friends and Family: Once a week    Attends Religious Services: Never    Database administrator or Organizations: Yes    Attends Engineer, structural: More than 4 times per year    Marital Status: Separated    Allergies: No Known Allergies  Metabolic Disorder Labs: No results found for: "HGBA1C", "MPG" No results found for: "PROLACTIN" Lab Results  Component Value Date   CHOL 293 (HH) 01/02/2007   TRIG 249 (HH) 01/02/2007   HDL 42.5 01/02/2007   CHOLHDL 6.9 CALC 01/02/2007   VLDL 50 (H) 01/02/2007   Lab Results  Component Value Date   TSH 2.180 05/13/2014   TSH 1.49 08/08/2006    Therapeutic Level Labs: No results found for: "LITHIUM" No results found for: "VALPROATE" Lab Results  Component Value Date   CBMZ 10.7 Performed at [facility] 02/02/2007    Current Medications: Current Outpatient Medications  Medication Sig Dispense Refill   FLUoxetine (PROZAC) 20 MG capsule Take 3 capsules (60 mg total) by mouth daily. 90 capsule 3   ARIPiprazole (ABILIFY) 10 MG tablet Take 1 tablet (10 mg total) by mouth daily. 30 tablet 3   Cholecalciferol (VITAMIN D-3) 5000 UNIT/ML LIQD Place under the tongue daily.      FLUoxetine (PROZAC) 40 MG capsule Take 1 capsule (40 mg total) by mouth daily. 30 capsule 3   oxyCODONE-acetaminophen (PERCOCET/ROXICET) 5-325 MG tablet Take 1 tablet by mouth every 6 (six) hours as needed for severe pain. 12 tablet 0   No current facility-administered medications for this visit.     Musculoskeletal: Strength & Muscle Tone: within normal limits and telehealth visit Gait & Station: normal, telehealth visit Patient leans: N/A  Psychiatric Specialty Exam: Review of Systems  Last menstrual period 05/14/2014.There is no height or weight on file to calculate BMI.  General Appearance: Well Groomed  Eye Contact:  Good  Speech:  Clear and Coherent and Normal Rate  Volume:  Normal  Mood:  Depressed  Affect:  Appropriate and Congruent  Thought Process:  Coherent, Goal Directed and Linear  Orientation:  Full (Time, Place, and Person)  Thought Content: WDL and Logical   Suicidal Thoughts:  No  Homicidal Thoughts:  No  Memory:  Immediate;   Good Recent;   Good Remote;   Good  Judgement:  Good  Insight:  Good  Psychomotor Activity:  Normal  Concentration:  Concentration: Good and Attention Span: Good  Recall:  Good  Fund of Knowledge: Good  Language: Good  Akathisia:  No  Handed:  Right  AIMS (if indicated): Not done  Assets:  Communication Skills Desire for Improvement Financial Resources/Insurance Housing Social Support  ADL's:  Intact  Cognition: WNL  Sleep:  Good   Screenings: AUDIT    Flowsheet Row [facility]  Alcohol Use Disorder Identification Test Final Score (AUDIT) 0      GAD-7    Flowsheet Row Video Visit from 07/18/2023  in [facility] Video Visit from 05/15/2023 in [facility] Video Visit from 02/20/2023 in [facility] Video Visit from 12/12/2022 in [facility] Video Visit from 07/24/2022 in [facility]  Total GAD-7 Score 4 1 0 0 3      PHQ2-9    Flowsheet Row Video Visit from 07/18/2023 in [facility] Video Visit from 05/15/2023 in [facility] Video Visit from 02/20/2023 in [facility] Video Visit from 12/12/2022 in [facility] Video Visit from 07/24/2022 in [facility]  PHQ-2 Total Score 4 0 0 2 0  PHQ-9 Total Score 12 2 0 9 0      Flowsheet Row ED from 10/22/2021 in [facility] Video Visit from 11/28/2020 in [facility] Video Visit from 10/12/2020 in [facility]  C-SSRS RISK CATEGORY No Risk No Risk No Risk        Assessment and Plan: Patient notes that she has been overwhelmed and depressed due to life stressors.Today Prozac 40 mg increased to 60 mg to help manage her anxiety and depression.  She will continue her other medications as described and follow-up with outpatient counseling for therapy.  1. Moderate episode of recurrent major depressive disorder (HCC)  Continue- ARIPiprazole (ABILIFY) 10 MG tablet; Take 1 tablet (10 mg total) by mouth daily.  Dispense: 30 tablet; Refill: 3 Increased- FLUoxetine (PROZAC) 20 MG capsule; Take 3 capsules (60 mg total) by mouth daily.  Dispense: 90 capsule; Refill: 3      Follow up in 3 months Follow up with therapy  Shanna Cisco, NP 07/18/2023, 11:47 AM

## 2023-09-26 ENCOUNTER — Telehealth (HOSPITAL_COMMUNITY): Payer: 59 | Admitting: Psychiatry

## 2023-09-26 ENCOUNTER — Encounter (HOSPITAL_COMMUNITY): Payer: Self-pay | Admitting: Psychiatry

## 2023-09-26 DIAGNOSIS — F331 Major depressive disorder, recurrent, moderate: Secondary | ICD-10-CM

## 2023-09-26 MED ORDER — FLUOXETINE HCL 20 MG PO CAPS: 60.0000 mg | ORAL_CAPSULE | Freq: Every day | ORAL | 3 refills | Status: DC

## 2023-09-26 MED ORDER — ARIPIPRAZOLE 10 MG PO TABS: 10.0000 mg | ORAL_TABLET | Freq: Every day | ORAL | 3 refills | Status: DC

## 2023-09-26 MED ORDER — FLUOXETINE HCL 40 MG PO CAPS: 40.0000 mg | ORAL_CAPSULE | Freq: Every day | ORAL | 3 refills | Status: DC

## 2023-09-26 NOTE — Progress Notes (Signed)
BH MD/PA/NP OP Progress Note   Virtual Visit via Video Note  I connected with Toni Alvarado on 09/26/23 at  3:30 PM EST by a video enabled telemedicine application and verified that I am speaking with the correct person using two identifiers.  Location: Patient:Work Provider: Clinic   I discussed the limitations of evaluation and management by telemedicine and the availability of in person appointments. The patient expressed understanding and agreed to proceed.  I provided 30 minutes of non-face-to-face time during this encounter.         09/26/2023 9:31 AM Toni Alvarado  MRN:  [id number]  Chief Complaint:  "I am doing okay"  HPI: 58 year old female seen today for follow up psychiatric evaluation.  She has a psychiatric history of anxiety, PTSD, and depression.  She is currently managed on Prozac 60 mg daily and Abilify 10 mg daily.  She notes that her medications are effective in managing her psychiatric conditions.   Today she was pleasant, cooperative, and engaged in conversation.  She informed Clinical research associate that she is doing okay. She continues to work two jobs. She notes that she continues to care for her significant other who has cancer, her son who has lyme disease, and her future mother-in-law who also has cancer.  Patient notes that she copes by exercising and learning spanish. She reports that she has the support of her sister and coworker.   Since her last visit she informed writer that her anxiety and depression has improved.  Provider conducted a GAD-7 and patient scored a 1, at her last visit she scored a 4.  Provider also conducted PHQ-9 and patient scored a 4, at her last she scored a 12 she endorses adequate sleep and appetite. Today she denies SI/HI/VAH, mania, or paranoia.   No medication changes made today. Patient agreeable to continue medications as prescribed.   No other concerns at this time.    Visit Diagnosis:    ICD-10-CM   1. Moderate episode of recurrent major  depressive disorder (HCC)  F33.1 FLUoxetine (PROZAC) 40 MG capsule    FLUoxetine (PROZAC) 20 MG capsule    ARIPiprazole (ABILIFY) 10 MG tablet         Past Psychiatric History: anxiety, PTSD, and depression.   Past Medical History:  Past Medical History:  Diagnosis Date   Anemia    ANEMIA, B12 DEFICIENCY 05/01/2007   Qualifier: Diagnosis of  By: Lovell Sheehan MD, John E    Anxiety    B12 deficiency    Depression    DEPRESSION 04/08/2007   Qualifier: Diagnosis of  By: Mayford Knife, LPN, Bonnye M    DYSLIPIDEMIA 10/06/2007   Qualifier: Diagnosis of  By: Pasty Arch LPN, Regina     Generalized anxiety disorder 05/13/2014   Hx of adenomatous polyp of colon 04/06/2021   single adenoma < 1 cm   INTERNAL HEMORRHOIDS 10/06/2007   Qualifier: Diagnosis of  By: Pasty Arch LPN, Regina     Irritable bowel syndrome 10/06/2007   Qualifier: Diagnosis of  By: Pasty Arch LPN, Regina     MENORRHAGIA 10/06/2007   Qualifier: Diagnosis of  By: Pasty Arch LPN, Trevor Mace, HX OF 04/08/2007   Qualifier: Diagnosis of  By: Mayford Knife LPN, Domenic Polite    OBESITY 10/06/2007   Qualifier: Diagnosis of  By: Pasty Arch LPN, Regina     OSTEOPENIA 04/08/2007   Qualifier: Diagnosis of  By: Mayford Knife LPN, Domenic Polite    PTSD (post-traumatic stress disorder) 06/06/2018   RECTAL BLEEDING 10/06/2007  Qualifier: Diagnosis of  By: Pasty Arch LPN, Regina     Severe episode of recurrent major depressive disorder (HCC) 05/13/2014   Suicidal ideation 05/12/2014    Past Surgical History:  Procedure Laterality Date   COLONOSCOPY     LITHOTRIPSY      Family Psychiatric History: mother depression and alcohol use, father alcohol use and bipolar disorder   Family History:       Family History     Family History:  Family History  Problem Relation Age of Onset   Hyperlipidemia Mother    Mental illness Mother    Alcohol abuse Mother    Depression Mother    Physical abuse Mother    Diabetes Father    Heart disease Father    Hyperlipidemia  Father    Stroke Father    Mental illness Father    Alcohol abuse Father    Bipolar disorder Father    Breast cancer Sister    Mental illness Brother    Cancer Brother    ADD / ADHD Brother    Alcohol abuse Brother    Bipolar disorder Brother    Sexual abuse Brother    Colon cancer Maternal Aunt    Alcohol abuse Maternal Aunt    Anxiety disorder Maternal Aunt    Depression Maternal Aunt    Drug abuse Maternal Aunt    Alcohol abuse Maternal Uncle    Alcohol abuse Maternal Grandfather    Alcohol abuse Paternal Grandfather    Depression Daughter        good with Cymbalta   Esophageal cancer Neg Hx    Rectal cancer Neg Hx    Stomach cancer Neg Hx     Social History:  Social History   Socioeconomic History   Marital status: Legally Separated    Spouse name: Not on file   Number of children: Not on file   Years of education: Not on file   Highest education level: Not on file  Occupational History   Not on file  Tobacco Use   Smoking status: Former    Current packs/day: 0.00    Types: Cigarettes    Quit date: 03/27/2001    Years since quitting: 22.5   Smokeless tobacco: Never   Tobacco comments:    working to stop doing this again.  Was quit for 10 years  Vaping Use   Vaping status: Never Used  Substance and Sexual Activity   Alcohol use: Yes    Comment: Occasional use but did drink some the previous week to help wiht anxitety per patient  report    Drug use: No   Sexual activity: Yes    Partners: Male    Birth control/protection: None  Other Topics Concern   Not on file  Social History Narrative   Not on file   Social Drivers of Health   Financial Resource Strain: Low Risk  (10/24/2017)   Overall Financial Resource Strain (CARDIA)    Difficulty of Paying Living Expenses: Not very hard  Food Insecurity: No Food Insecurity (10/24/2017)   Hunger Vital Sign    Worried About Running Out of Food in the Last Year: Never true    Ran Out of Food in the Last Year:  Never true  Transportation Needs: No Transportation Needs (10/24/2017)   PRAPARE - Administrator, Civil Service (Medical): No    Lack of Transportation (Non-Medical): No  Physical Activity: Unknown (10/24/2017)   Exercise Vital Sign    Days  of Exercise per Week: 0 days    Minutes of Exercise per Session: Not on file  Stress: Stress Concern Present (10/24/2017)   [employer] of Occupational Health - Occupational Stress Questionnaire    Feeling of Stress : Very much  Social Connections: Somewhat Isolated (10/24/2017)   Social Connection and Isolation Panel [NHANES]    Frequency of Communication with Friends and Family: More than three times a week    Frequency of Social Gatherings with Friends and Family: Once a week    Attends Religious Services: Never    Database administrator or Organizations: Yes    Attends Engineer, structural: More than 4 times per year    Marital Status: Separated    Allergies: No Known Allergies  Metabolic Disorder Labs: No results found for: "HGBA1C", "MPG" No results found for: "PROLACTIN" Lab Results  Component Value Date   CHOL 293 (HH) 01/02/2007   TRIG 249 (HH) 01/02/2007   HDL 42.5 01/02/2007   CHOLHDL 6.9 CALC 01/02/2007   VLDL 50 (H) 01/02/2007   Lab Results  Component Value Date   TSH 2.180 05/13/2014   TSH 1.49 08/08/2006    Therapeutic Level Labs: No results found for: "LITHIUM" No results found for: "VALPROATE" Lab Results  Component Value Date   CBMZ 10.7 Performed at [facility] 02/02/2007    Current Medications: Current Outpatient Medications  Medication Sig Dispense Refill   ARIPiprazole (ABILIFY) 10 MG tablet Take 1 tablet (10 mg total) by mouth daily. 30 tablet 3   Cholecalciferol (VITAMIN D-3) 5000 UNIT/ML LIQD Place under the tongue daily.     FLUoxetine (PROZAC) 20 MG capsule Take 3 capsules (60 mg total) by mouth daily. 90 capsule 3   FLUoxetine (PROZAC) 40 MG capsule Take 1 capsule (40  mg total) by mouth daily. 30 capsule 3   oxyCODONE-acetaminophen (PERCOCET/ROXICET) 5-325 MG tablet Take 1 tablet by mouth every 6 (six) hours as needed for severe pain. 12 tablet 0   No current facility-administered medications for this visit.     Musculoskeletal: Strength & Muscle Tone: within normal limits and telehealth visit Gait & Station: normal, telehealth visit Patient leans: N/A  Psychiatric Specialty Exam: Review of Systems  Last menstrual period 05/14/2014.There is no height or weight on file to calculate BMI.  General Appearance: Well Groomed  Eye Contact:  Good  Speech:  Clear and Coherent and Normal Rate  Volume:  Normal  Mood:  Depressed  Affect:  Appropriate and Congruent  Thought Process:  Coherent, Goal Directed and Linear  Orientation:  Full (Time, Place, and Person)  Thought Content: WDL and Logical   Suicidal Thoughts:  No  Homicidal Thoughts:  No  Memory:  Immediate;   Good Recent;   Good Remote;   Good  Judgement:  Good  Insight:  Good  Psychomotor Activity:  Normal  Concentration:  Concentration: Good and Attention Span: Good  Recall:  Good  Fund of Knowledge: Good  Language: Good  Akathisia:  No  Handed:  Right  AIMS (if indicated): Not done  Assets:  Communication Skills Desire for Improvement Financial Resources/Insurance Housing Social Support  ADL's:  Intact  Cognition: WNL  Sleep:  Good   Screenings: AUDIT    Flowsheet Row [facility]  Alcohol Use Disorder Identification Test Final Score (AUDIT) 0      GAD-7    Flowsheet Row Video Visit from 09/26/2023 in [facility]  Center Video Visit from 07/18/2023 in [facility] Video Visit from 05/15/2023 in [facility] Video Visit from 02/20/2023 in [facility] Video Visit from 12/12/2022 in [facility]  Total GAD-7 Score 1 4 1  0 0      PHQ2-9    Flowsheet Row Video Visit from 09/26/2023 in [facility] Video Visit from 07/18/2023 in [facility] Video Visit from 05/15/2023 in [facility] Video Visit from 02/20/2023 in [facility] Video Visit from 12/12/2022 in [facility]  PHQ-2 Total Score 0 4 0 0 2  PHQ-9 Total Score 4 12 2  0 9      Flowsheet Row ED from 10/22/2021 in [facility] at [facility] Video Visit from 11/28/2020 in [facility] Video Visit from 10/12/2020 in [facility]  C-SSRS RISK CATEGORY No Risk No Risk No Risk        Assessment and Plan: Patient notes that she is doing well on her current medication regimen. No medication changes made today. Patient agreeable to continue medications as prescribed.   1. Moderate episode of recurrent major depressive disorder (HCC)  Continue- FLUoxetine (PROZAC) 40 MG capsule; Take 1 capsule (40 mg total) by mouth daily.  Dispense: 30 capsule; Refill: 3 Continue- FLUoxetine (PROZAC) 20 MG capsule; Take 3 capsules (60 mg total) by mouth daily.  Dispense: 90 capsule; Refill: 3 Continue- ARIPiprazole (ABILIFY) 10 MG tablet; Take 1 tablet (10 mg total) by mouth daily.  Dispense: 30 tablet; Refill: 3      Follow up in 3 months Follow up with therapy  Shanna Cisco, NP 09/26/2023, 9:31 AM

## 2023-10-22 ENCOUNTER — Other Ambulatory Visit (HOSPITAL_COMMUNITY): Payer: Self-pay | Admitting: Psychiatry

## 2023-10-22 DIAGNOSIS — F331 Major depressive disorder, recurrent, moderate: Secondary | ICD-10-CM

## 2023-12-11 ENCOUNTER — Encounter (HOSPITAL_COMMUNITY): Payer: Self-pay | Admitting: Psychiatry

## 2023-12-11 ENCOUNTER — Telehealth (HOSPITAL_COMMUNITY): Payer: 59 | Admitting: Psychiatry

## 2023-12-11 DIAGNOSIS — F331 Major depressive disorder, recurrent, moderate: Secondary | ICD-10-CM | POA: Diagnosis not present

## 2023-12-11 MED ORDER — FLUOXETINE HCL 40 MG PO CAPS: 40.0000 mg | ORAL_CAPSULE | Freq: Every day | ORAL | 2 refills | Status: DC

## 2023-12-11 MED ORDER — ARIPIPRAZOLE 10 MG PO TABS: 10.0000 mg | ORAL_TABLET | Freq: Every day | ORAL | 3 refills | Status: DC

## 2023-12-11 MED ORDER — FLUOXETINE HCL 20 MG PO CAPS: 60.0000 mg | ORAL_CAPSULE | Freq: Every day | ORAL | 3 refills | Status: DC

## 2023-12-11 NOTE — Progress Notes (Signed)
 BH MD/PA/NP OP Progress Note   Virtual Visit via Video Note  I connected with Toni Alvarado on 12/11/23 at  3:00 PM EDT by a video enabled telemedicine application and verified that I am speaking with the correct person using two identifiers.  Location: Patient:Work Provider: Clinic   I discussed the limitations of evaluation and management by telemedicine and the availability of in person appointments. The patient expressed understanding and agreed to proceed.  I provided 30 minutes of non-face-to-face time during this encounter.         12/11/2023 3:25 PM Toni Alvarado  MRN:  [id number]  Chief Complaint:  "I am doing okay"  HPI: 58 year old female seen today for follow up psychiatric evaluation.  She has a psychiatric history of anxiety, PTSD, and depression.  She is currently managed on Prozac 60 mg daily and Abilify 10 mg daily.  She notes that her medications are effective in managing her psychiatric conditions.   Today she was pleasant, cooperative, and engaged in conversation.  She informed Clinical research associate that she is doing okay. 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  She notes that she is excited about this honor and appreciates being rewarded.  Patient reports that mentally she is doing well.  She notes her mood is stable and reports that she has minimal anxiety and depression.  Provider conducted GAD-7 and patient scored a 1, at her last visit she scored a 1.  Provider also conducted PHQ-9 patient rated 0, at her last visit she scored a 4.  She endorses adequate sleep and appetite. Today she denies SI/HI/VAH, mania, or paranoia.   Patient notes that recently her son was diagnosed with Dysautonomia which affects his autonomic nervous system.  She notes that he has been optimistic and holding onto his faith as he goes through this condition.  She also reports that her fianc recently went through a round of chemo and notes that he is physically drained  but is coping well.  His fiances mother also continues to suffer with cancer.  Despite the above stressors patient notes that she is doing well.  No medication changes made today. Patient agreeable to continue medications as prescribed.   No other concerns at this time.    Visit Diagnosis:    ICD-10-CM   1. Moderate episode of recurrent major depressive disorder (HCC)  F33.1 ARIPiprazole (ABILIFY) 10 MG tablet    FLUoxetine (PROZAC) 20 MG capsule    FLUoxetine (PROZAC) 40 MG capsule          Past Psychiatric History: anxiety, PTSD, and depression.   Past Medical History:  Past Medical History:  Diagnosis Date   Anemia    ANEMIA, B12 DEFICIENCY 05/01/2007   Qualifier: Diagnosis of  By: Lovell Sheehan MD, John E    Anxiety    B12 deficiency    Depression    DEPRESSION 04/08/2007   Qualifier: Diagnosis of  By: Mayford Knife, LPN, Bonnye M    DYSLIPIDEMIA 10/06/2007   Qualifier: Diagnosis of  By: Pasty Arch LPN, Regina     Generalized anxiety disorder 05/13/2014   Hx of adenomatous polyp of colon 04/06/2021   single adenoma < 1 cm   INTERNAL HEMORRHOIDS 10/06/2007   Qualifier: Diagnosis of  By: Pasty Arch LPN, Regina     Irritable bowel syndrome 10/06/2007   Qualifier: Diagnosis of  By: Pasty Arch LPN, Regina     MENORRHAGIA 10/06/2007   Qualifier: Diagnosis of  By: Pasty Arch LPN, Rene Kocher  NEPHROLITHIASIS, HX OF 04/08/2007   Qualifier: Diagnosis of  By: Mayford Knife, LPN, Domenic Polite    OBESITY 10/06/2007   Qualifier: Diagnosis of  By: Pasty Arch LPN, Regina     OSTEOPENIA 04/08/2007   Qualifier: Diagnosis of  By: Mayford Knife, LPN, Domenic Polite    PTSD (post-traumatic stress disorder) 06/06/2018   RECTAL BLEEDING 10/06/2007   Qualifier: Diagnosis of  By: Pasty Arch LPN, Regina     Severe episode of recurrent major depressive disorder (HCC) 05/13/2014   Suicidal ideation 05/12/2014    Past Surgical History:  Procedure Laterality Date   COLONOSCOPY     LITHOTRIPSY      Family Psychiatric History: mother depression and  alcohol use, father alcohol use and bipolar disorder   Family History:       Family History     Family History:  Family History  Problem Relation Age of Onset   Hyperlipidemia Mother    Mental illness Mother    Alcohol abuse Mother    Depression Mother    Physical abuse Mother    Diabetes Father    Heart disease Father    Hyperlipidemia Father    Stroke Father    Mental illness Father    Alcohol abuse Father    Bipolar disorder Father    Breast cancer Sister    Mental illness Brother    Cancer Brother    ADD / ADHD Brother    Alcohol abuse Brother    Bipolar disorder Brother    Sexual abuse Brother    Colon cancer Maternal Aunt    Alcohol abuse Maternal Aunt    Anxiety disorder Maternal Aunt    Depression Maternal Aunt    Drug abuse Maternal Aunt    Alcohol abuse Maternal Uncle    Alcohol abuse Maternal Grandfather    Alcohol abuse Paternal Grandfather    Depression Daughter        good with Cymbalta   Esophageal cancer Neg Hx    Rectal cancer Neg Hx    Stomach cancer Neg Hx     Social History:  Social History   Socioeconomic History   Marital status: Legally Separated    Spouse name: Not on file   Number of children: Not on file   Years of education: Not on file   Highest education level: Not on file  Occupational History   Not on file  Tobacco Use   Smoking status: Former    Current packs/day: 0.00    Types: Cigarettes    Quit date: 03/27/2001    Years since quitting: 22.7   Smokeless tobacco: Never   Tobacco comments:    working to stop doing this again.  Was quit for 10 years  Vaping Use   Vaping status: Never Used  Substance and Sexual Activity   Alcohol use: Yes    Comment: Occasional use but did drink some the previous week to help wiht anxitety per patient  report    Drug use: No   Sexual activity: Yes    Partners: Male    Birth control/protection: None  Other Topics Concern   Not on file  Social History Narrative   Not on file    Social Drivers of Health   Financial Resource Strain: Low Risk  (10/24/2017)   Overall Financial Resource Strain (CARDIA)    Difficulty of Paying Living Expenses: Not very hard  Food Insecurity: No Food Insecurity (10/24/2017)   Hunger Vital Sign    Worried About Running Out of  Food in the Last Year: Never true    Ran Out of Food in the Last Year: Never true  Transportation Needs: No Transportation Needs (10/24/2017)   PRAPARE - Administrator, Civil Service (Medical): No    Lack of Transportation (Non-Medical): No  Physical Activity: Unknown (10/24/2017)   Exercise Vital Sign    Days of Exercise per Week: 0 days    Minutes of Exercise per Session: Not on file  Stress: Stress Concern Present (10/24/2017)   [employer] of Occupational Health - Occupational Stress Questionnaire    Feeling of Stress : Very much  Social Connections: Somewhat Isolated (10/24/2017)   Social Connection and Isolation Panel [NHANES]    Frequency of Communication with Friends and Family: More than three times a week    Frequency of Social Gatherings with Friends and Family: Once a week    Attends Religious Services: Never    Database administrator or Organizations: Yes    Attends Engineer, structural: More than 4 times per year    Marital Status: Separated    Allergies: No Known Allergies  Metabolic Disorder Labs: No results found for: "HGBA1C", "MPG" No results found for: "PROLACTIN" Lab Results  Component Value Date   CHOL 293 (HH) 01/02/2007   TRIG 249 (HH) 01/02/2007   HDL 42.5 01/02/2007   CHOLHDL 6.9 CALC 01/02/2007   VLDL 50 (H) 01/02/2007   Lab Results  Component Value Date   TSH 2.180 05/13/2014   TSH 1.49 08/08/2006    Therapeutic Level Labs: No results found for: "LITHIUM" No results found for: "VALPROATE" Lab Results  Component Value Date   CBMZ 10.7 Performed at [facility] 02/02/2007    Current Medications: Current Outpatient Medications   Medication Sig Dispense Refill   ARIPiprazole (ABILIFY) 10 MG tablet Take 1 tablet (10 mg total) by mouth daily. 30 tablet 3   Cholecalciferol (VITAMIN D-3) 5000 UNIT/ML LIQD Place under the tongue daily.     FLUoxetine (PROZAC) 20 MG capsule Take 3 capsules (60 mg total) by mouth daily. 90 capsule 3   FLUoxetine (PROZAC) 40 MG capsule Take 1 capsule (40 mg total) by mouth daily. 90 capsule 2   oxyCODONE-acetaminophen (PERCOCET/ROXICET) 5-325 MG tablet Take 1 tablet by mouth every 6 (six) hours as needed for severe pain. 12 tablet 0   No current facility-administered medications for this visit.     Musculoskeletal: Strength & Muscle Tone: within normal limits and telehealth visit Gait & Station: normal, telehealth visit Patient leans: N/A  Psychiatric Specialty Exam: Review of Systems  Last menstrual period 05/14/2014.There is no height or weight on file to calculate BMI.  General Appearance: Well Groomed  Eye Contact:  Good  Speech:  Clear and Coherent and Normal Rate  Volume:  Normal  Mood:  Euthymic  Affect:  Appropriate and Congruent  Thought Process:  Coherent, Goal Directed and Linear  Orientation:  Full (Time, Place, and Person)  Thought Content: WDL and Logical   Suicidal Thoughts:  No  Homicidal Thoughts:  No  Memory:  Immediate;   Good Recent;   Good Remote;   Good  Judgement:  Good  Insight:  Good  Psychomotor Activity:  Normal  Concentration:  Concentration: Good and Attention Span: Good  Recall:  Good  Fund of Knowledge: Good  Language: Good  Akathisia:  No  Handed:  Right  AIMS (if indicated): Not done  Assets:  Communication Skills Desire for Improvement Financial Resources/Insurance Housing Social  Support  ADL's:  Intact  Cognition: WNL  Sleep:  Good   Screenings: AUDIT    Flowsheet Row [facility]  Alcohol Use Disorder Identification Test Final Score (AUDIT) 0       GAD-7    Flowsheet Row Video Visit from 12/11/2023 in [facility] Video Visit from 09/26/2023 in [facility] Video Visit from 07/18/2023 in [facility] Video Visit from 05/15/2023 in [facility] Video Visit from 02/20/2023 in [facility]  Total GAD-7 Score 1 1 4 1  0      PHQ2-9    Flowsheet Row Video Visit from 12/11/2023 in [facility] Video Visit from 09/26/2023 in [facility] Video Visit from 07/18/2023 in [facility] Video Visit from 05/15/2023 in [facility] Video Visit from 02/20/2023 in [facility]  PHQ-2 Total Score 0 0 4 0 0  PHQ-9 Total Score 0 4 12 2  0      Flowsheet Row ED from 10/22/2021 in [facility] Video Visit from 11/28/2020 in [facility] Video Visit from 10/12/2020 in [facility]  C-SSRS RISK CATEGORY No Risk No Risk No Risk        Assessment and Plan: Patient notes that she is doing well on her current medication regimen despite life stressors. No medication changes made today. Patient agreeable to continue medications as prescribed.   1. Moderate episode of recurrent major depressive disorder (HCC)  Continue- FLUoxetine (PROZAC) 40 MG capsule; Take 1 capsule (40 mg total) by mouth daily.  Dispense: 30 capsule; Refill: 3 Continue- FLUoxetine (PROZAC) 20 MG capsule; Take 3 capsules (60 mg total) by mouth daily.  Dispense: 90 capsule; Refill: 3 Continue- ARIPiprazole (ABILIFY) 10 MG tablet; Take 1 tablet (10 mg total) by mouth daily.  Dispense: 30 tablet; Refill: 3      Follow up in 3 months Follow up with therapy  Arlyne Bering, NP 12/11/2023, 3:25 PM

## 2024-01-03 IMAGING — MG MM DIGITAL DIAGNOSTIC UNILAT*L* W/ TOMO W/ CAD
8 series · 9 of 24 positions shown · non-contrast
Comparison: Previous exam(s).

CLINICAL DATA: Patient recalled from screening for left breast
asymmetry.

EXAM:
DIGITAL DIAGNOSTIC UNILATERAL LEFT MAMMOGRAM WITH TOMOSYNTHESIS AND
CAD
TECHNIQUE: Left digital diagnostic mammography and breast tomosynthesis was
performed. The images were evaluated with computer-aided detection.

[L MLO synth-2D]
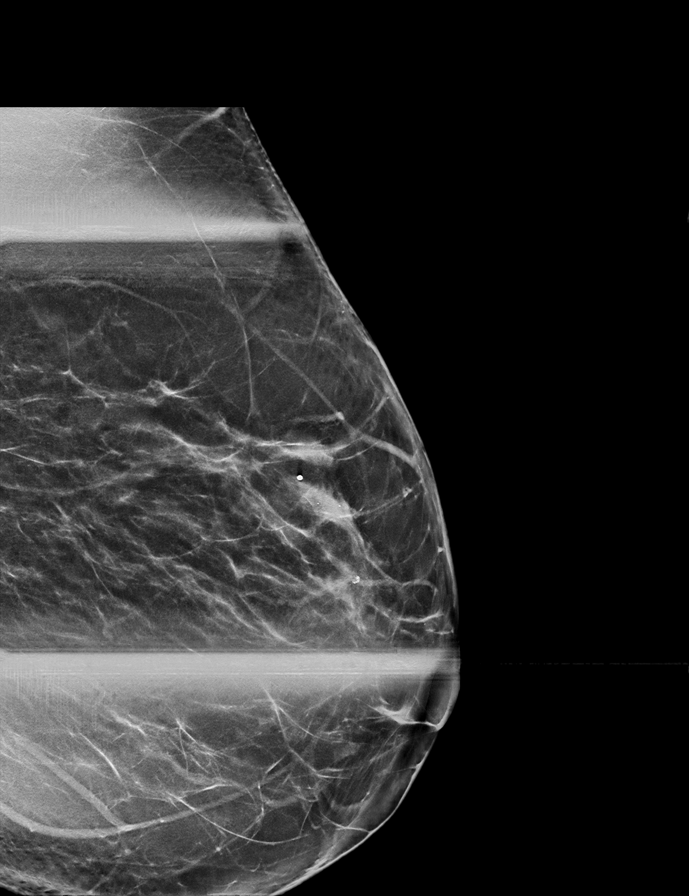

[L CC synth-2D (1 of 2)]
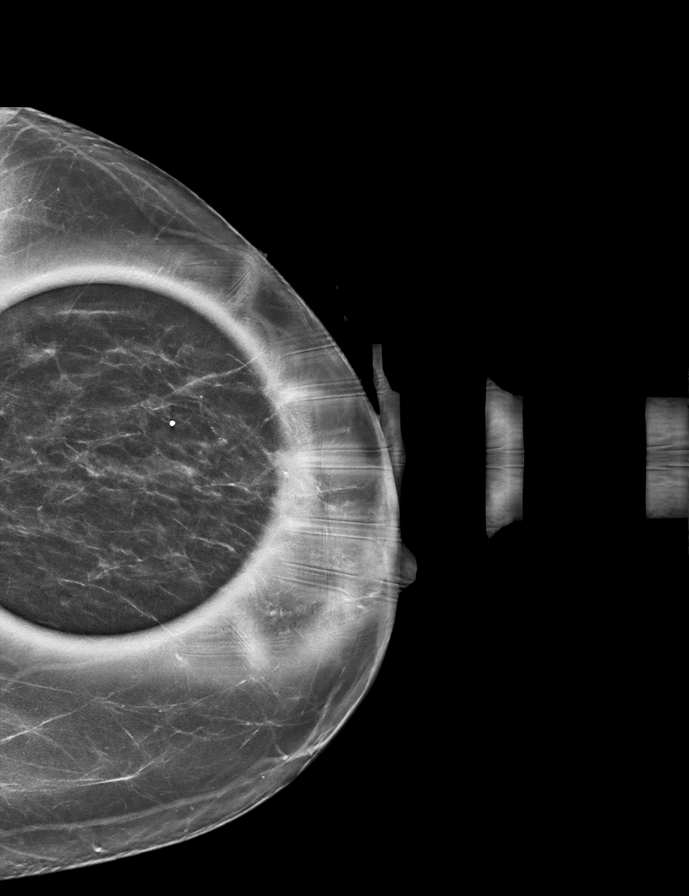

[L ML synth-2D]
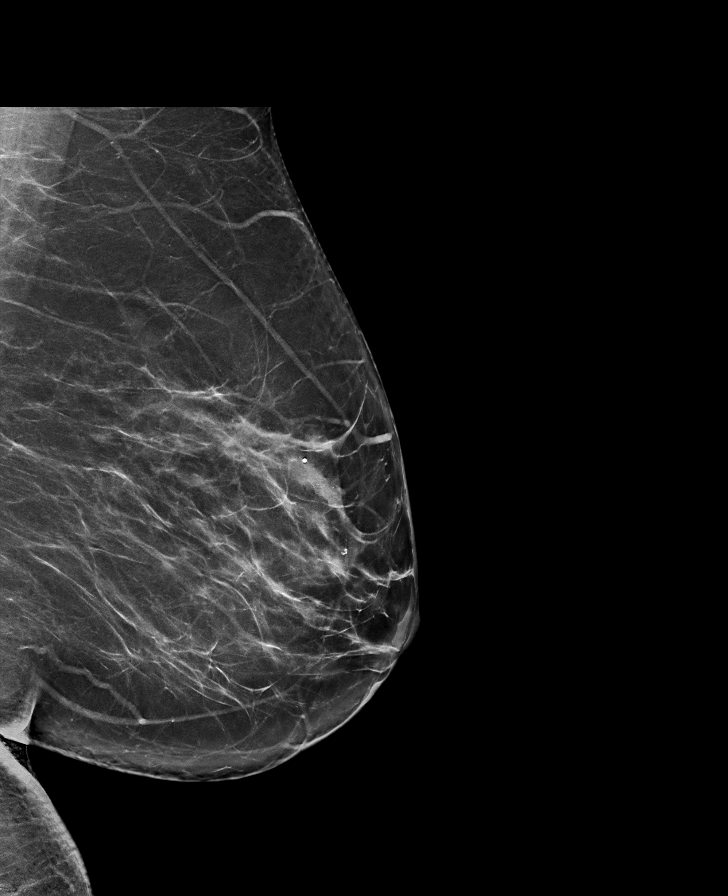

[L CC synth-2D (2 of 2)]
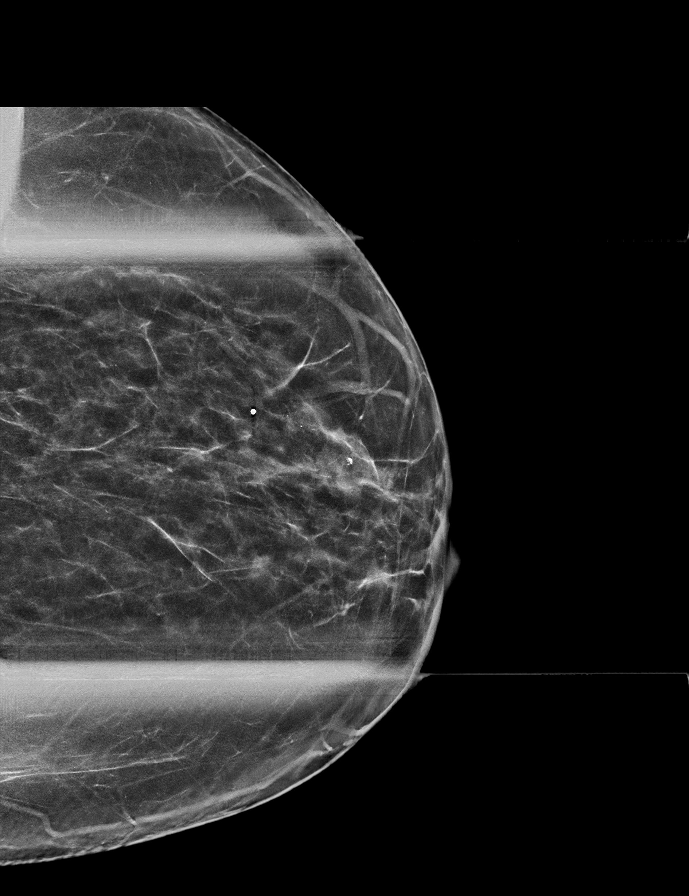

[L CC tomo · 2 of 63 frames shown (1 of 2)]
[frame 21/63]
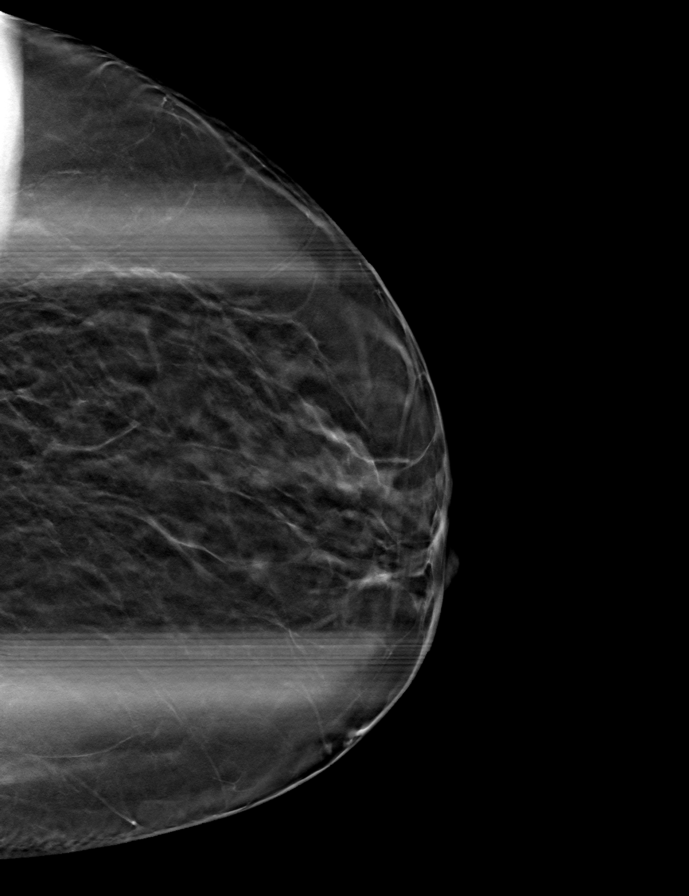
[frame 32/63]
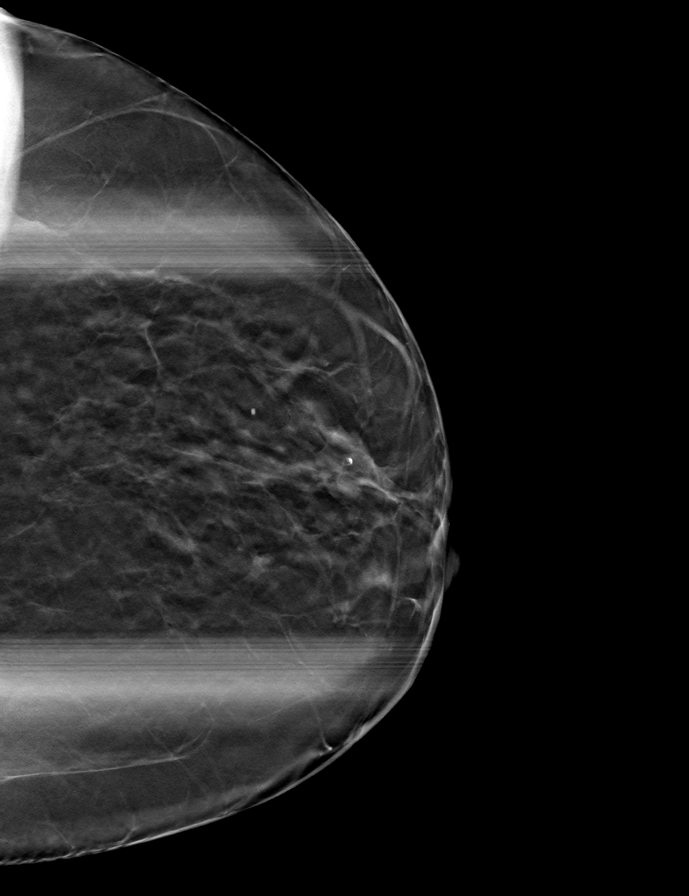

[L MLO tomo · tomo slice 35/70.0]
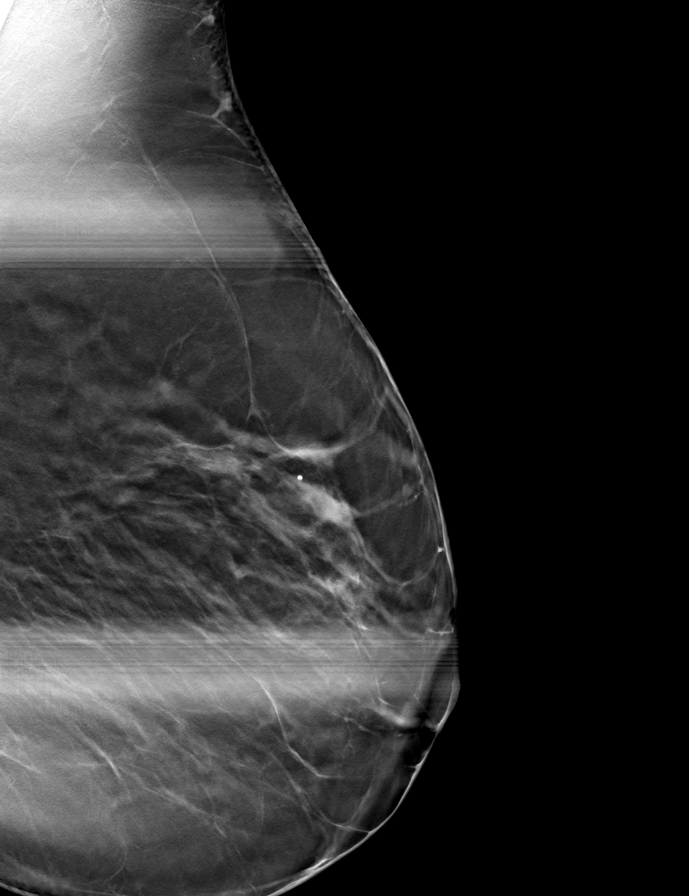

[L CC tomo (2 of 2) · tomo slice 31/61.0]
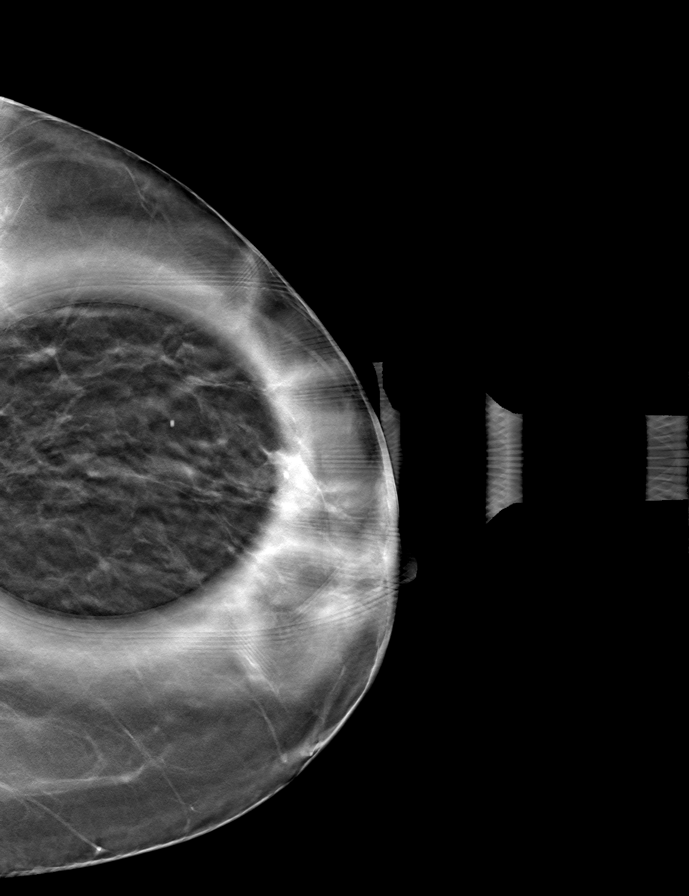

[L ML tomo · tomo slice 41/82.0]
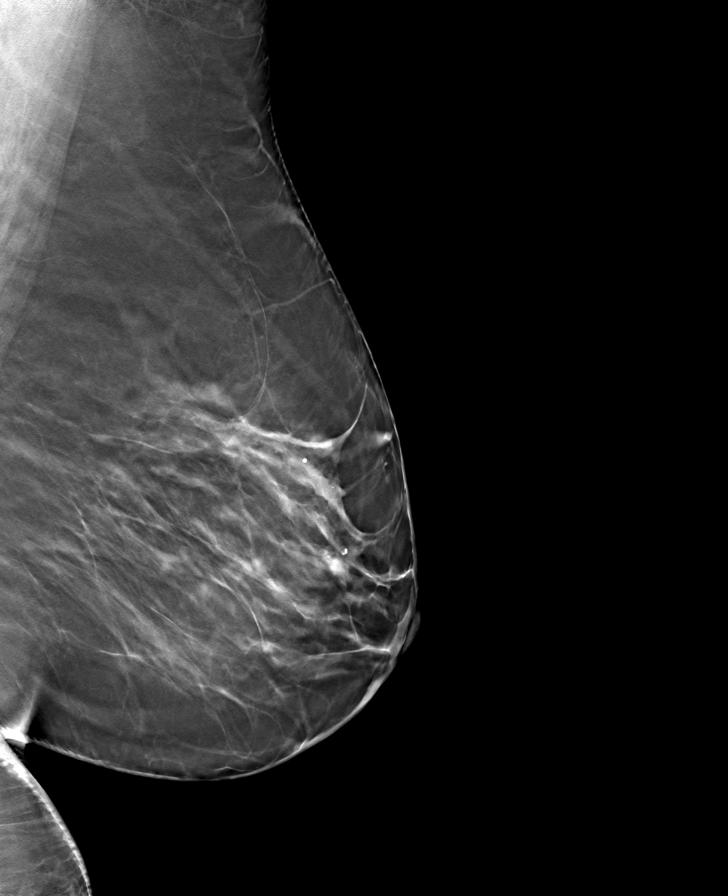

[9 of 24 positions shown; findings below may reference images not displayed]

ACR Breast Density Category c: The breast tissue is heterogeneously
dense, which may obscure small masses.
FINDINGS: Questioned asymmetry within the left breast resolved with additional
imaging compatible with dense overlapping fibroglandular tissue. No
suspicious findings on additional imaging.
IMPRESSION: No mammographic evidence for malignancy.

RECOMMENDATION:
Screening mammogram in one year.(Code:[id number])

I have discussed the findings and recommendations with the patient.
If applicable, a reminder letter will be sent to the patient
regarding the next appointment.

BI-RADS CATEGORY  1: Negative.

## 2024-02-05 ENCOUNTER — Encounter (HOSPITAL_COMMUNITY): Payer: Self-pay | Admitting: Psychiatry

## 2024-02-05 ENCOUNTER — Telehealth (HOSPITAL_COMMUNITY): Admitting: Psychiatry

## 2024-02-05 DIAGNOSIS — F331 Major depressive disorder, recurrent, moderate: Secondary | ICD-10-CM

## 2024-02-05 MED ORDER — FLUOXETINE HCL 20 MG PO CAPS: 60.0000 mg | ORAL_CAPSULE | Freq: Every day | ORAL | 3 refills | Status: DC

## 2024-02-05 MED ORDER — ARIPIPRAZOLE 10 MG PO TABS: 10.0000 mg | ORAL_TABLET | Freq: Every day | ORAL | 3 refills | Status: DC

## 2024-02-05 MED ORDER — FLUOXETINE HCL 40 MG PO CAPS: 40.0000 mg | ORAL_CAPSULE | Freq: Every day | ORAL | 2 refills | Status: DC

## 2024-02-05 NOTE — Progress Notes (Signed)
 BH MD/PA/NP OP Progress Note   Virtual Visit via Video Note  I connected with Toni Alvarado on 02/05/24 at  3:30 PM EDT by a video enabled telemedicine application and verified that I am speaking with the correct person using two identifiers.  Location: Patient:Cancer Center Provider: Clinic   I discussed the limitations of evaluation and management by telemedicine and the availability of in person appointments. The patient expressed understanding and agreed to proceed.  I provided 30 minutes of non-face-to-face time during this encounter.         02/05/2024 3:44 PM Toni Alvarado  MRN:  [id number]  Chief Complaint:  I am doing okay  HPI: 58 year old female seen today for follow up psychiatric evaluation.  She has a psychiatric history of anxiety, PTSD, and depression.  She is currently managed on Prozac  60 mg daily and Abilify  10 mg daily.  She notes that her medications are effective in managing her psychiatric conditions.   Today she was pleasant, cooperative, and engaged in conversation.  She informed Clinical research associate that she is at the cancer center with her future mother-in-law.  She informed Clinical research associate that her mother-in-law has chemo several times a week.  She also notes that her significant other and her son continues to struggle with their health.  Despite these stressors patient notes that her mood, anxiety, depression are well-managed.  She asked writer if her GAD-7 and PHQ screenings could be done at her next visit as she was trying to be attentive to her mother-in-law.  Provider was agreeable.  She does endorse adequate sleep and appetite.  Patient denies SI/HI/AVH, mania, paranoia.    Despite the above stressors patient notes that she is doing well.  No medication changes made today. Patient agreeable to continue medications as prescribed.   No other concerns at this time.    Visit Diagnosis:    ICD-10-CM   1. Moderate episode of recurrent major depressive disorder (HCC)  F33.1  ARIPiprazole  (ABILIFY ) 10 MG tablet    FLUoxetine  (PROZAC ) 20 MG capsule    FLUoxetine  (PROZAC ) 40 MG capsule           Past Psychiatric History: anxiety, PTSD, and depression.   Past Medical History:  Past Medical History:  Diagnosis Date   Anemia    ANEMIA, B12 DEFICIENCY 05/01/2007   Qualifier: Diagnosis of  By: Larrie Po MD, John E    Anxiety    B12 deficiency    Depression    DEPRESSION 04/08/2007   Qualifier: Diagnosis of  By: Broadus Canes, LPN, Bonnye M    DYSLIPIDEMIA 10/06/2007   Qualifier: Diagnosis of  By: Taunya Fass LPN, Regina     Generalized anxiety disorder 05/13/2014   Hx of adenomatous polyp of colon 04/06/2021   single adenoma < 1 cm   INTERNAL HEMORRHOIDS 10/06/2007   Qualifier: Diagnosis of  By: Taunya Fass LPN, Regina     Irritable bowel syndrome 10/06/2007   Qualifier: Diagnosis of  By: Taunya Fass LPN, Regina     MENORRHAGIA 10/06/2007   Qualifier: Diagnosis of  By: Taunya Fass LPN, Louellen Route, HX OF 04/08/2007   Qualifier: Diagnosis of  By: Broadus Canes LPN, Magdal[employer]    OBESITY 10/06/2007   Qualifier: Diagnosis of  By: Taunya Fass LPN, Regina     OSTEOPENIA 04/08/2007   Qualifier: Diagnosis of  By: Broadus Canes LPN, [employer]    PTSD (post-traumatic stress disorder) 06/06/2018   RECTAL BLEEDING 10/06/2007   Qualifier: Diagnosis of  By: Taunya Fass LPN, Leward Record  Severe episode of recurrent major depressive disorder (HCC) 05/13/2014   Suicidal ideation 05/12/2014    Past Surgical History:  Procedure Laterality Date   COLONOSCOPY     LITHOTRIPSY      Family Psychiatric History: mother depression and alcohol use, father alcohol use and bipolar disorder   Family History:       Family History     Family History:  Family History  Problem Relation Age of Onset   Hyperlipidemia Mother    Mental illness Mother    Alcohol abuse Mother    Depression Mother    Physical abuse Mother    Diabetes Father    Heart disease Father    Hyperlipidemia Father    Stroke Father     Mental illness Father    Alcohol abuse Father    Bipolar disorder Father    Breast cancer Sister    Mental illness Brother    Cancer Brother    ADD / ADHD Brother    Alcohol abuse Brother    Bipolar disorder Brother    Sexual abuse Brother    Colon cancer Maternal Aunt    Alcohol abuse Maternal Aunt    Anxiety disorder Maternal Aunt    Depression Maternal Aunt    Drug abuse Maternal Aunt    Alcohol abuse Maternal Uncle    Alcohol abuse Maternal Grandfather    Alcohol abuse Paternal Grandfather    Depression Daughter        good with Cymbalta   Esophageal cancer Neg Hx    Rectal cancer Neg Hx    Stomach cancer Neg Hx     Social History:  Social History   Socioeconomic History   Marital status: Legally Separated    Spouse name: Not on file   Number of children: Not on file   Years of education: Not on file   Highest education level: Not on file  Occupational History   Not on file  Tobacco Use   Smoking status: Former    Current packs/day: 0.00    Types: Cigarettes    Quit date: 03/27/2001    Years since quitting: 22.8   Smokeless tobacco: Never   Tobacco comments:    working to stop doing this again.  Was quit for 10 years  Vaping Use   Vaping status: Never Used  Substance and Sexual Activity   Alcohol use: Yes    Comment: Occasional use but did drink some the previous week to help wiht anxitety per patient  report    Drug use: No   Sexual activity: Yes    Partners: Male    Birth control/protection: None  Other Topics Concern   Not on file  Social History Narrative   Not on file   Social Drivers of Health   Financial Resource Strain: Low Risk  (10/24/2017)   Overall Financial Resource Strain (CARDIA)    Difficulty of Paying Living Expenses: Not very hard  Food Insecurity: No Food Insecurity (10/24/2017)   Hunger Vital Sign    Worried About Running Out of Food in the Last Year: Never true    Ran Out of Food in the Last Year: Never true  Transportation  Needs: No Transportation Needs (10/24/2017)   PRAPARE - Administrator, Civil Service (Medical): No    Lack of Transportation (Non-Medical): No  Physical Activity: Unknown (10/24/2017)   Exercise Vital Sign    Days of Exercise per Week: 0 days    Minutes of Exercise  per Session: Not on file  Stress: Stress Concern Present (10/24/2017)   [employer] of Occupational Health - Occupational Stress Questionnaire    Feeling of Stress : Very much  Social Connections: Somewhat Isolated (10/24/2017)   Social Connection and Isolation Panel [NHANES]    Frequency of Communication with Friends and Family: More than three times a week    Frequency of Social Gatherings with Friends and Family: Once a week    Attends Religious Services: Never    Database administrator or Organizations: Yes    Attends Engineer, structural: More than 4 times per year    Marital Status: Separated    Allergies: No Known Allergies  Metabolic Disorder Labs: No results found for: HGBA1C, MPG No results found for: PROLACTIN Lab Results  Component Value Date   CHOL 293 (HH) 01/02/2007   TRIG 249 (HH) 01/02/2007   HDL 42.5 01/02/2007   CHOLHDL 6.9 CALC 01/02/2007   VLDL 50 (H) 01/02/2007   Lab Results  Component Value Date   TSH 2.180 05/13/2014   TSH 1.49 08/08/2006    Therapeutic Level Labs: No results found for: LITHIUM No results found for: VALPROATE Lab Results  Component Value Date   CBMZ 10.7 Performed at [facility] 02/02/2007    Current Medications: Current Outpatient Medications  Medication Sig Dispense Refill   ARIPiprazole  (ABILIFY ) 10 MG tablet Take 1 tablet (10 mg total) by mouth daily. 30 tablet 3   Cholecalciferol (VITAMIN D -3) 5000 UNIT/ML LIQD Place under the tongue daily.     FLUoxetine  (PROZAC ) 20 MG capsule Take 3 capsules (60 mg total) by mouth daily. 90 capsule 3   FLUoxetine  (PROZAC ) 40 MG capsule Take 1 capsule (40 mg total) by mouth daily.  90 capsule 2   oxyCODONE -acetaminophen  (PERCOCET/ROXICET) 5-325 MG tablet Take 1 tablet by mouth every 6 (six) hours as needed for severe pain. 12 tablet 0   No current facility-administered medications for this visit.     Musculoskeletal: Strength & Muscle Tone: within normal limits and telehealth visit Gait & Station: normal, telehealth visit Patient leans: N/A  Psychiatric Specialty Exam: Review of Systems  Last menstrual period 05/14/2014.There is no height or weight on file to calculate BMI.  General Appearance: Well Groomed  Eye Contact:  Good  Speech:  Clear and Coherent and Normal Rate  Volume:  Normal  Mood:  Euthymic  Affect:  Appropriate and Congruent  Thought Process:  Coherent, Goal Directed and Linear  Orientation:  Full (Time, Place, and Person)  Thought Content: WDL and Logical   Suicidal Thoughts:  No  Homicidal Thoughts:  No  Memory:  Immediate;   Good Recent;   Good Remote;   Good  Judgement:  Good  Insight:  Good  Psychomotor Activity:  Normal  Concentration:  Concentration: Good and Attention Span: Good  Recall:  Good  Fund of Knowledge: Good  Language: Good  Akathisia:  No  Handed:  Right  AIMS (if indicated): Not done  Assets:  Communication Skills Desire for Improvement Financial Resources/Insurance Housing Social Support  ADL's:  Intact  Cognition: WNL  Sleep:  Good   Screenings: AUDIT    Flowsheet Row [facility]  Alcohol Use Disorder Identification Test Final Score (AUDIT) 0      GAD-7    Flowsheet Row Video Visit from 12/11/2023 in [facility] Video Visit from 09/26/2023 in [facility] Video  Visit from 07/18/2023 in [facility] Video Visit from 05/15/2023 in [facility] Video Visit from 02/20/2023 in [facility]  Total  GAD-7 Score 1 1 4 1  0      PHQ2-9    Flowsheet Row Video Visit from 12/11/2023 in [facility] Video Visit from 09/26/2023 in [facility] Video Visit from 07/18/2023 in [facility] Video Visit from 05/15/2023 in [facility] Video Visit from 02/20/2023 in [facility]  PHQ-2 Total Score 0 0 4 0 0  PHQ-9 Total Score 0 4 12 2  0      Flowsheet Row ED from 10/22/2021 in [facility] Video Visit from 11/28/2020 in [facility] Video Visit from 10/12/2020 in [facility]  C-SSRS RISK CATEGORY No Risk No Risk No Risk        Assessment and Plan: Patient notes that she is doing well on her current medication regimen despite life stressors. No medication changes made today. Patient agreeable to continue medications as prescribed.   1. Moderate episode of recurrent major depressive disorder (HCC)  Continue- FLUoxetine  (PROZAC ) 40 MG capsule; Take 1 capsule (40 mg total) by mouth daily.  Dispense: 30 capsule; Refill: 3 Continue- FLUoxetine  (PROZAC ) 20 MG capsule; Take 3 capsules (60 mg total) by mouth daily.  Dispense: 90 capsule; Refill: 3 Continue- ARIPiprazole  (ABILIFY ) 10 MG tablet; Take 1 tablet (10 mg total) by mouth daily.  Dispense: 30 tablet; Refill: 3      Follow up in 3 months Follow up with therapy  Arlyne Bering, NP 02/05/2024, 3:44 PM

## 2024-02-26 ENCOUNTER — Other Ambulatory Visit: Payer: Self-pay | Admitting: Obstetrics and Gynecology

## 2024-02-26 DIAGNOSIS — R928 Other abnormal and inconclusive findings on diagnostic imaging of breast: Secondary | ICD-10-CM

## 2024-03-03 ENCOUNTER — Ambulatory Visit
Admission: RE | Admit: 2024-03-03 | Discharge: 2024-03-03 | Disposition: A | Discharge status: Home / Self Care | Payer: Self-pay | Source: Ambulatory Visit | Attending: Obstetrics and Gynecology | Admitting: Obstetrics and Gynecology

## 2024-03-03 DIAGNOSIS — R928 Other abnormal and inconclusive findings on diagnostic imaging of breast: Secondary | ICD-10-CM

## 2024-04-30 ENCOUNTER — Encounter (HOSPITAL_COMMUNITY): Payer: Self-pay | Admitting: Family

## 2024-04-30 ENCOUNTER — Telehealth (INDEPENDENT_AMBULATORY_CARE_PROVIDER_SITE_OTHER): Admitting: Family

## 2024-04-30 DIAGNOSIS — F331 Major depressive disorder, recurrent, moderate: Secondary | ICD-10-CM | POA: Diagnosis not present

## 2024-04-30 MED ORDER — ARIPIPRAZOLE 10 MG PO TABS: 10.0000 mg | ORAL_TABLET | Freq: Every day | ORAL | 3 refills | Status: DC

## 2024-04-30 MED ORDER — FLUOXETINE HCL 40 MG PO CAPS: 40.0000 mg | ORAL_CAPSULE | Freq: Every day | ORAL | 2 refills | Status: DC

## 2024-04-30 NOTE — Progress Notes (Signed)
 BH MD/PA/NP OP Progress Note   Virtual Visit via Video Note  I connected with Toni Alvarado on 04/30/24 at  3:00 PM EDT by a video enabled telemedicine application and verified that I am speaking with the correct person using two identifiers.  Location: Patient:Cancer Center Provider: Clinic   I discussed the limitations of evaluation and management by telemedicine and the availability of in person appointments. The patient expressed understanding and agreed to proceed.  I provided 30 minutes of non-face-to-face time during this encounter.     04/30/2024 2:59 PM Toni Alvarado  MRN:  [id number]  Chief Complaint:  I am doing okay  HPI: 58 year old female seen today for follow up psychiatric evaluation.  She has a psychiatric history of anxiety, PTSD, and depression.  She is currently managed on Prozac  60 mg daily and Abilify  10 mg daily.  She notes that her medications are effective in managing her psychiatric conditions.   Patient was seen and assessed today. She reports obtaining approximately 6-7 hours of sleep nightly and compensates by sleeping in on her days off. She describes ongoing stressors related to her husband and mother-in-law, who are both undergoing cancer treatment, noting that her husband wants her to be present constantly while she balances work responsibilities and family obligations. Despite these challenges, she denies episodes of deep depression, significant anxiety, or panic attacks, and states that her faith has been a sustaining source of strength. She currently works two jobs.  Patient reports eating well without difficulty but expresses frustration with weight loss, stating, "I can't seem to lose weight." She is not exercising consistently but had been walking previously and looks forward to resuming activity when the weather cools. She reports that her medications are working well and is requesting refills today. She denies paranoia, suicidal ideation, homicidal  ideation, or hallucinations. On exam, she appears stable, upbeat, and engaged, with bright affect. She also shares that her son is being treated for Lyme disease but notes that he has remained positive through the process.  Visit Diagnosis:    ICD-10-CM   1. Moderate episode of recurrent major depressive disorder (HCC)  F33.1         Past Psychiatric History: anxiety, PTSD, and depression.   Past Medical History:  Past Medical History:  Diagnosis Date   Anemia    ANEMIA, B12 DEFICIENCY 05/01/2007   Qualifier: Diagnosis of  By: Mavis MD, John E    Anxiety    B12 deficiency    Depression    DEPRESSION 04/08/2007   Qualifier: Diagnosis of  By: Trudy, LPN, Bonnye M    DYSLIPIDEMIA 10/06/2007   Qualifier: Diagnosis of  By: Henrietta LPN, Regina     Generalized anxiety disorder 05/13/2014   Hx of adenomatous polyp of colon 04/06/2021   single adenoma < 1 cm   INTERNAL HEMORRHOIDS 10/06/2007   Qualifier: Diagnosis of  By: Henrietta LPN, Regina     Irritable bowel syndrome 10/06/2007   Qualifier: Diagnosis of  By: Henrietta LPN, Regina     MENORRHAGIA 10/06/2007   Qualifier: Diagnosis of  By: Henrietta LPN, Angeline SITU, HX OF 04/08/2007   Qualifier: Diagnosis of  By: Trudy LPN, Kendell HERO    OBESITY 10/06/2007   Qualifier: Diagnosis of  By: Henrietta LPN, Regina     OSTEOPENIA 04/08/2007   Qualifier: Diagnosis of  By: Trudy LPN, Kendell HERO    PTSD (post-traumatic stress disorder) 06/06/2018   RECTAL BLEEDING 10/06/2007   Qualifier: Diagnosis of  By: Henrietta LPN, Regina     Severe episode of recurrent major depressive disorder (HCC) 05/13/2014   Suicidal ideation 05/12/2014    Past Surgical History:  Procedure Laterality Date   COLONOSCOPY     LITHOTRIPSY      Family Psychiatric History: mother depression and alcohol use, father alcohol use and bipolar disorder   Family History:       Family History     Family History:  Family History  Problem Relation Age of Onset    Hyperlipidemia Mother    Mental illness Mother    Alcohol abuse Mother    Depression Mother    Physical abuse Mother    Diabetes Father    Heart disease Father    Hyperlipidemia Father    Stroke Father    Mental illness Father    Alcohol abuse Father    Bipolar disorder Father    Breast cancer Sister    Mental illness Brother    Cancer Brother    ADD / ADHD Brother    Alcohol abuse Brother    Bipolar disorder Brother    Sexual abuse Brother    Colon cancer Maternal Aunt    Alcohol abuse Maternal Aunt    Anxiety disorder Maternal Aunt    Depression Maternal Aunt    Drug abuse Maternal Aunt    Alcohol abuse Maternal Uncle    Alcohol abuse Maternal Grandfather    Alcohol abuse Paternal Grandfather    Depression Daughter        good with Cymbalta   Esophageal cancer Neg Hx    Rectal cancer Neg Hx    Stomach cancer Neg Hx     Social History:  Social History   Socioeconomic History   Marital status: Legally Separated    Spouse name: Not on file   Number of children: Not on file   Years of education: Not on file   Highest education level: Not on file  Occupational History   Not on file  Tobacco Use   Smoking status: Former    Current packs/day: 0.00    Types: Cigarettes    Quit date: 03/27/2001    Years since quitting: 23.1   Smokeless tobacco: Never   Tobacco comments:    working to stop doing this again.  Was quit for 10 years  Vaping Use   Vaping status: Never Used  Substance and Sexual Activity   Alcohol use: Yes    Comment: Occasional use but did drink some the previous week to help wiht anxitety per patient  report    Drug use: No   Sexual activity: Yes    Partners: Male    Birth control/protection: None  Other Topics Concern   Not on file  Social History Narrative   Not on file   Social Drivers of Health   Financial Resource Strain: Low Risk  (10/24/2017)   Overall Financial Resource Strain (CARDIA)    Difficulty of Paying Living Expenses: Not very  hard  Food Insecurity: No Food Insecurity (10/24/2017)   Hunger Vital Sign    Worried About Running Out of Food in the Last Year: Never true    Ran Out of Food in the Last Year: Never true  Transportation Needs: No Transportation Needs (10/24/2017)   PRAPARE - Administrator, Civil Service (Medical): No    Lack of Transportation (Non-Medical): No  Physical Activity: Unknown (10/24/2017)   Exercise Vital Sign    Days of Exercise per Week:  0 days    Minutes of Exercise per Session: Not on file  Stress: Stress Concern Present (10/24/2017)   [employer] of Occupational Health - Occupational Stress Questionnaire    Feeling of Stress : Very much  Social Connections: Somewhat Isolated (10/24/2017)   Social Connection and Isolation Panel    Frequency of Communication with Friends and Family: More than three times a week    Frequency of Social Gatherings with Friends and Family: Once a week    Attends Religious Services: Never    Database administrator or Organizations: Yes    Attends Engineer, structural: More than 4 times per year    Marital Status: Separated    Allergies: No Known Allergies  Metabolic Disorder Labs: No results found for: HGBA1C, MPG No results found for: PROLACTIN Lab Results  Component Value Date   CHOL 293 (HH) 01/02/2007   TRIG 249 (HH) 01/02/2007   HDL 42.5 01/02/2007   CHOLHDL 6.9 CALC 01/02/2007   VLDL 50 (H) 01/02/2007   Lab Results  Component Value Date   TSH 2.180 05/13/2014   TSH 1.49 08/08/2006    Therapeutic Level Labs: No results found for: LITHIUM No results found for: VALPROATE Lab Results  Component Value Date   CBMZ 10.7 Performed at [facility] 02/02/2007    Current Medications: Current Outpatient Medications  Medication Sig Dispense Refill   ARIPiprazole  (ABILIFY ) 10 MG tablet Take 1 tablet (10 mg total) by mouth daily. 30 tablet 3   Cholecalciferol (VITAMIN D -3) 5000 UNIT/ML LIQD Place under  the tongue daily.     FLUoxetine  (PROZAC ) 20 MG capsule Take 3 capsules (60 mg total) by mouth daily. 90 capsule 3   FLUoxetine  (PROZAC ) 40 MG capsule Take 1 capsule (40 mg total) by mouth daily. 90 capsule 2   oxyCODONE -acetaminophen  (PERCOCET/ROXICET) 5-325 MG tablet Take 1 tablet by mouth every 6 (six) hours as needed for severe pain. 12 tablet 0   No current facility-administered medications for this visit.     Musculoskeletal: Strength & Muscle Tone: within normal limits and telehealth visit Gait & Station: normal, telehealth visit Patient leans: N/A  Psychiatric Specialty Exam: Review of Systems  All other systems reviewed and are negative.   Last menstrual period 05/14/2014.There is no height or weight on file to calculate BMI.  General Appearance: Well Groomed  Eye Contact:  Good  Speech:  Clear and Coherent and Normal Rate  Volume:  Normal  Mood:  Euthymic  Affect:  Appropriate and Congruent  Thought Process:  Coherent, Goal Directed and Linear  Orientation:  Full (Time, Place, and Person)  Thought Content: WDL and Logical   Suicidal Thoughts:  No  Homicidal Thoughts:  No  Memory:  Immediate;   Good Recent;   Good Remote;   Good  Judgement:  Good  Insight:  Good  Psychomotor Activity:  Normal  Concentration:  Concentration: Good and Attention Span: Good  Recall:  Good  Fund of Knowledge: Good  Language: Good  Akathisia:  No  Handed:  Right  AIMS (if indicated): Not done  Assets:  Communication Skills Desire for Improvement Financial Resources/Insurance Housing Social Support  ADL's:  Intact  Cognition: WNL  Sleep:  Good   Screenings: AUDIT    Flowsheet Row [facility]  Alcohol Use Disorder Identification Test Final Score (AUDIT) 0   GAD-7    Flowsheet Row Video Visit from 12/11/2023 in [facility] Video Visit from 09/26/2023 in [facility] Video Visit from 07/18/2023 in [facility] Video Visit from 05/15/2023 in [facility] Video Visit from 02/20/2023 in [facility]  Total GAD-7 Score 1 1 4 1  0   PHQ2-9    Flowsheet Row [facility]11/2023 in [facility] Video Visit from 09/26/2023 in [facility] Video Visit from 07/18/2023 in [facility] Video Visit from 05/15/2023 in [facility] Video Visit from 02/20/2023 in [facility]  PHQ-2 Total Score 0 0 4 0 0  PHQ-9 Total Score 0 4 12 2  0   Flowsheet Row ED from 10/22/2021 in [facility] Video Visit from 11/28/2020 in [facility] Video Visit from 10/12/2020 in [facility]  C-SSRS RISK CATEGORY No Risk No Risk No Risk     Assessment and Plan: . Patient is a middle-aged female with ongoing situational stressors related to her husband and mother-in-law's cancer diagnoses. She remains resilient, endorses adequate sleep overall, and denies significant depression, anxiety, or panic symptoms. She reports good adherence and benefit from her current medication regimen. No safety concerns identified, as she denies SI/HI/AVH and presents as stable, upbeat, and engaged. Ongoing challenges include weight management and limited exercise.  1. Moderate episode of recurrent major depressive disorder (HCC)  Continue- FLUoxetine  (PROZAC ) 40 MG capsule; Take 1 capsule (40 mg total) by mouth daily.  Dispense: 30 capsule; Refill: 3 Continue- FLUoxetine  (PROZAC ) 20 MG capsule; Take 3 capsules (60 mg total) by mouth daily.  Dispense: 90 capsule; Refill: 3 Continue- ARIPiprazole  (ABILIFY ) 10 MG tablet; Take 1 tablet (10 mg total) by mouth daily.  Dispense: 30  tablet; Refill: 3      Follow up in 3 months Follow up with therapy  Majel GORMAN Ramp, FNP 04/30/2024, 2:59 PM

## 2024-05-06 ENCOUNTER — Telehealth (HOSPITAL_COMMUNITY): Admitting: Psychiatry

## 2024-07-30 ENCOUNTER — Telehealth (HOSPITAL_COMMUNITY): Admitting: Psychiatry

## 2024-08-03 ENCOUNTER — Encounter (HOSPITAL_COMMUNITY): Payer: Self-pay | Admitting: Psychiatry

## 2024-08-03 ENCOUNTER — Telehealth (INDEPENDENT_AMBULATORY_CARE_PROVIDER_SITE_OTHER): Admitting: Psychiatry

## 2024-08-03 DIAGNOSIS — F331 Major depressive disorder, recurrent, moderate: Secondary | ICD-10-CM

## 2024-08-03 MED ORDER — FLUOXETINE HCL 40 MG PO CAPS: 40.0000 mg | ORAL_CAPSULE | Freq: Every day | ORAL | 3 refills | Status: AC

## 2024-08-03 MED ORDER — ARIPIPRAZOLE 10 MG PO TABS: 10.0000 mg | ORAL_TABLET | Freq: Every day | ORAL | 3 refills | Status: AC

## 2024-08-03 NOTE — Progress Notes (Signed)
 BH MD/PA/NP OP Progress Note   Virtual Visit via Video Note  I connected with Toni Alvarado on 08/03/24 at  3:30 PM EST by a video enabled telemedicine application and verified that I am speaking with the correct person using two identifiers.  Location: Patient:Cancer Center Provider: Clinic   I discussed the limitations of evaluation and management by telemedicine and the availability of in person appointments. The patient expressed understanding and agreed to proceed.  I provided 30 minutes of non-face-to-face time during this encounter.         08/03/2024 2:43 PM Toni Alvarado  MRN:  [id number]  Chief Complaint:  I been have not seen the 20 mg of Prozac  and things are fine  HPI: 58 year old female seen today for follow up psychiatric evaluation.  She has a psychiatric history of anxiety, PTSD, and depression.  She is currently managed on Prozac  60 mg daily and Abilify  10 mg daily.  She notes that her medications effective in managing her psychiatric conditions.   Today she was pleasant, cooperative, and engaged in conversation.  She informed clinical research associate that she has not been taking the extra 20 mg of Prozac  and notes that things have been fine.  She reports that her mood continues to be stable and notes that she has minimal anxiety and depression.  Today provider conducted GAD-7 and patient scored a 4. Provider also conducted a PH9 and patient scored a 2. She endorses adequate sleep and appetite. Today she  denies SI/HI/AVH, mania, paranoia.     No medication changes made today. Patient agreeable to continue medications as prescribed.   No other concerns at this time.    Visit Diagnosis:    ICD-10-CM   1. Moderate episode of recurrent major depressive disorder (HCC)  F33.1 ARIPiprazole  (ABILIFY ) 10 MG tablet    FLUoxetine  (PROZAC ) 40 MG capsule            Past Psychiatric History: anxiety, PTSD, and depression.   Past Medical History:  Past Medical History:  Diagnosis  Date   Anemia    ANEMIA, B12 DEFICIENCY 05/01/2007   Qualifier: Diagnosis of  By: Mavis MD, John E    Anxiety    B12 deficiency    Depression    DEPRESSION 04/08/2007   Qualifier: Diagnosis of  By: Trudy, LPN, Bonnye M    DYSLIPIDEMIA 10/06/2007   Qualifier: Diagnosis of  By: Henrietta LPN, Regina     Generalized anxiety disorder 05/13/2014   Hx of adenomatous polyp of colon 04/06/2021   single adenoma < 1 cm   INTERNAL HEMORRHOIDS 10/06/2007   Qualifier: Diagnosis of  By: Henrietta LPN, Regina     Irritable bowel syndrome 10/06/2007   Qualifier: Diagnosis of  By: Henrietta LPN, Regina     MENORRHAGIA 10/06/2007   Qualifier: Diagnosis of  By: Henrietta LPN, Angeline SITU, HX OF 04/08/2007   Qualifier: Diagnosis of  By: Trudy LPN, Kendell HERO    OBESITY 10/06/2007   Qualifier: Diagnosis of  By: Henrietta LPN, Regina     OSTEOPENIA 04/08/2007   Qualifier: Diagnosis of  By: Trudy, LPN, Kendell HERO    PTSD (post-traumatic stress disorder) 06/06/2018   RECTAL BLEEDING 10/06/2007   Qualifier: Diagnosis of  By: Henrietta LPN, Regina     Severe episode of recurrent major depressive disorder (HCC) 05/13/2014   Suicidal ideation 05/12/2014    Past Surgical History:  Procedure Laterality Date   COLONOSCOPY     LITHOTRIPSY  Family Psychiatric History: mother depression and alcohol use, father alcohol use and bipolar disorder   Family History:       Family History     Family History:  Family History  Problem Relation Age of Onset   Hyperlipidemia Mother    Mental illness Mother    Alcohol abuse Mother    Depression Mother    Physical abuse Mother    Diabetes Father    Heart disease Father    Hyperlipidemia Father    Stroke Father    Mental illness Father    Alcohol abuse Father    Bipolar disorder Father    Breast cancer Sister    Mental illness Brother    Cancer Brother    ADD / ADHD Brother    Alcohol abuse Brother    Bipolar disorder Brother    Sexual abuse Brother     Colon cancer Maternal Aunt    Alcohol abuse Maternal Aunt    Anxiety disorder Maternal Aunt    Depression Maternal Aunt    Drug abuse Maternal Aunt    Alcohol abuse Maternal Uncle    Alcohol abuse Maternal Grandfather    Alcohol abuse Paternal Grandfather    Depression Daughter        good with Cymbalta   Esophageal cancer Neg Hx    Rectal cancer Neg Hx    Stomach cancer Neg Hx     Social History:  Social History   Socioeconomic History   Marital status: Legally Separated    Spouse name: Not on file   Number of children: Not on file   Years of education: Not on file   Highest education level: Not on file  Occupational History   Not on file  Tobacco Use   Smoking status: Former    Current packs/day: 0.00    Types: Cigarettes    Quit date: 03/27/2001    Years since quitting: 23.3   Smokeless tobacco: Never   Tobacco comments:    working to stop doing this again.  Was quit for 10 years  Vaping Use   Vaping status: Never Used  Substance and Sexual Activity   Alcohol use: Yes    Comment: Occasional use but did drink some the previous week to help wiht anxitety per patient  report    Drug use: No   Sexual activity: Yes    Partners: Male    Birth control/protection: None  Other Topics Concern   Not on file  Social History Narrative   Not on file   Social Drivers of Health   Financial Resource Strain: Low Risk  (10/24/2017)   Overall Financial Resource Strain (CARDIA)    Difficulty of Paying Living Expenses: Not very hard  Food Insecurity: No Food Insecurity (10/24/2017)   Hunger Vital Sign    Worried About Running Out of Food in the Last Year: Never true    Ran Out of Food in the Last Year: Never true  Transportation Needs: No Transportation Needs (10/24/2017)   PRAPARE - Administrator, Civil Service (Medical): No    Lack of Transportation (Non-Medical): No  Physical Activity: Unknown (10/24/2017)   Exercise Vital Sign    Days of Exercise per Week: 0  days    Minutes of Exercise per Session: Not on file  Stress: Stress Concern Present (10/24/2017)   [employer] of Occupational Health - Occupational Stress Questionnaire    Feeling of Stress : Very much  Social Connections: Somewhat Isolated (10/24/2017)  Social Connection and Isolation Panel    Frequency of Communication with Friends and Family: More than three times a week    Frequency of Social Gatherings with Friends and Family: Once a week    Attends Religious Services: Never    Database Administrator or Organizations: Yes    Attends Engineer, Structural: More than 4 times per year    Marital Status: Separated    Allergies: No Known Allergies  Metabolic Disorder Labs: No results found for: HGBA1C, MPG No results found for: PROLACTIN Lab Results  Component Value Date   CHOL 293 (HH) 01/02/2007   TRIG 249 (HH) 01/02/2007   HDL 42.5 01/02/2007   CHOLHDL 6.9 CALC 01/02/2007   VLDL 50 (H) 01/02/2007   Lab Results  Component Value Date   TSH 2.180 05/13/2014   TSH 1.49 08/08/2006    Therapeutic Level Labs: No results found for: LITHIUM No results found for: VALPROATE Lab Results  Component Value Date   CBMZ 10.7 Performed at [facility] 02/02/2007    Current Medications: Current Outpatient Medications  Medication Sig Dispense Refill   ARIPiprazole  (ABILIFY ) 10 MG tablet Take 1 tablet (10 mg total) by mouth daily. 30 tablet 3   Cholecalciferol (VITAMIN D -3) 5000 UNIT/ML LIQD Place under the tongue daily.     FLUoxetine  (PROZAC ) 40 MG capsule Take 1 capsule (40 mg total) by mouth daily. 90 capsule 3   oxyCODONE -acetaminophen  (PERCOCET/ROXICET) 5-325 MG tablet Take 1 tablet by mouth every 6 (six) hours as needed for severe pain. 12 tablet 0   No current facility-administered medications for this visit.     Musculoskeletal: Strength & Muscle Tone: within normal limits and telehealth visit Gait & Station: normal, telehealth  visit Patient leans: N/A  Psychiatric Specialty Exam: Review of Systems  Last menstrual period 05/14/2014.There is no height or weight on file to calculate BMI.  General Appearance: Well Groomed  Eye Contact:  Good  Speech:  Clear and Coherent and Normal Rate  Volume:  Normal  Mood:  Euthymic  Affect:  Appropriate and Congruent  Thought Process:  Coherent, Goal Directed and Linear  Orientation:  Full (Time, Place, and Person)  Thought Content: WDL and Logical   Suicidal Thoughts:  No  Homicidal Thoughts:  No  Memory:  Immediate;   Good Recent;   Good Remote;   Good  Judgement:  Good  Insight:  Good  Psychomotor Activity:  Normal  Concentration:  Concentration: Good and Attention Span: Good  Recall:  Good  Fund of Knowledge: Good  Language: Good  Akathisia:  No  Handed:  Right  AIMS (if indicated): Not done  Assets:  Communication Skills Desire for Improvement Financial Resources/Insurance Housing Social Support  ADL's:  Intact  Cognition: WNL  Sleep:  Good   Screenings: AUDIT    Flowsheet Row [facility]  Alcohol Use Disorder Identification Test Final Score (AUDIT) 0   GAD-7    Flowsheet Row Video Visit from 08/03/2024 in [facility] Video Visit from 12/11/2023 in [facility] Video Visit from 09/26/2023 in [facility] Video Visit from 07/18/2023 in [facility] Video Visit from 05/15/2023 in [facility]  Total GAD-7 Score 4 1 1 4 1    [phone]    Flowsheet Row Video Visit from 08/03/2024 in [facility] Video Visit from 12/11/2023 in [facility] Video Visit from 09/26/2023 in [facility] Video Visit from 07/18/2023 in [facility] Video Visit from  05/15/2023 in [facility]  PHQ-2 Total Score 0 0 0 4 0  PHQ-9 Total Score 2 0 4 12 2    Flowsheet Row ED from 10/22/2021 in [facility] Video Visit from 11/28/2020 in [facility] Video Visit from 10/12/2020 in [facility]  C-SSRS RISK CATEGORY No Risk No Risk No Risk     Assessment and Plan: Patient notes that she is doing well on her current medication regimen despite life stressors. No medication changes made today. Patient agreeable to continue medications as prescribed.   1. Moderate episode of recurrent major depressive disorder (HCC)  Continue- FLUoxetine  (PROZAC ) 40 MG capsule; Take 1 capsule (40 mg total) by mouth daily.  Dispense: 30 capsule; Refill: 3 Continue- FLUoxetine  (PROZAC ) 20 MG capsule; Take 3 capsules (60 mg total) by mouth daily.  Dispense: 90 capsule; Refill: 3 Continue- ARIPiprazole  (ABILIFY ) 10 MG tablet; Take 1 tablet (10 mg total) by mouth daily.  Dispense: 30 tablet; Refill: 3      Follow up in 2 months Follow up with therapy  Zane FORBES Bach, NP 08/03/2024, 2:43 PM

## 2024-10-15 ENCOUNTER — Telehealth (HOSPITAL_COMMUNITY): Admitting: Psychiatry
